# Patient Record
Sex: Female | Born: 1985 | Race: Black or African American | Hispanic: No | Marital: Single | Smoking: Never smoker
Health system: Southern US, Community
[De-identification: ages and names within clinical notes are randomized; demographics above are authoritative.]

## PROBLEM LIST (undated history)

## (undated) DIAGNOSIS — I10 Essential (primary) hypertension: Secondary | ICD-10-CM

## (undated) DIAGNOSIS — D649 Anemia, unspecified: Secondary | ICD-10-CM

## (undated) DIAGNOSIS — N926 Irregular menstruation, unspecified: Secondary | ICD-10-CM

## (undated) DIAGNOSIS — J302 Other seasonal allergic rhinitis: Secondary | ICD-10-CM

## (undated) DIAGNOSIS — E669 Obesity, unspecified: Secondary | ICD-10-CM

## (undated) DIAGNOSIS — N760 Acute vaginitis: Secondary | ICD-10-CM

## (undated) DIAGNOSIS — F32A Depression, unspecified: Secondary | ICD-10-CM

## (undated) DIAGNOSIS — F419 Anxiety disorder, unspecified: Secondary | ICD-10-CM

## (undated) DIAGNOSIS — B9689 Other specified bacterial agents as the cause of diseases classified elsewhere: Secondary | ICD-10-CM

## (undated) DIAGNOSIS — M1990 Unspecified osteoarthritis, unspecified site: Secondary | ICD-10-CM

## (undated) DIAGNOSIS — F39 Unspecified mood [affective] disorder: Secondary | ICD-10-CM

## (undated) HISTORY — PX: WISDOM TOOTH EXTRACTION: SHX21

## (undated) HISTORY — DX: Unspecified osteoarthritis, unspecified site: M19.90

## (undated) HISTORY — DX: Depression, unspecified: F32.A

## (undated) HISTORY — DX: Obesity, unspecified: E66.9

## (undated) HISTORY — DX: Essential (primary) hypertension: I10

## (undated) HISTORY — DX: Anemia, unspecified: D64.9

---
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
## 2005-04-23 ENCOUNTER — Emergency Department (HOSPITAL_COMMUNITY): Admission: EM | Admit: 2005-04-23 | Discharge: 2005-04-23 | Payer: Self-pay | Admitting: Emergency Medicine

## 2005-05-25 ENCOUNTER — Emergency Department (HOSPITAL_COMMUNITY): Admission: EM | Admit: 2005-05-25 | Discharge: 2005-05-25 | Payer: Self-pay | Admitting: Family Medicine

## 2005-12-03 ENCOUNTER — Emergency Department (HOSPITAL_COMMUNITY): Admission: EM | Admit: 2005-12-03 | Discharge: 2005-12-03 | Payer: Self-pay | Admitting: Emergency Medicine

## 2005-12-04 ENCOUNTER — Emergency Department (HOSPITAL_COMMUNITY): Admission: EM | Admit: 2005-12-04 | Discharge: 2005-12-04 | Payer: Self-pay | Admitting: Family Medicine

## 2006-12-26 ENCOUNTER — Emergency Department (HOSPITAL_COMMUNITY): Admission: EM | Admit: 2006-12-26 | Discharge: 2006-12-26 | Payer: Self-pay | Admitting: Emergency Medicine

## 2007-01-28 ENCOUNTER — Emergency Department (HOSPITAL_COMMUNITY): Admission: EM | Admit: 2007-01-28 | Discharge: 2007-01-28 | Payer: Self-pay | Admitting: Emergency Medicine

## 2007-03-21 ENCOUNTER — Emergency Department (HOSPITAL_COMMUNITY): Admission: EM | Admit: 2007-03-21 | Discharge: 2007-03-21 | Payer: Self-pay | Admitting: Emergency Medicine

## 2007-04-17 ENCOUNTER — Emergency Department (HOSPITAL_COMMUNITY): Admission: EM | Admit: 2007-04-17 | Discharge: 2007-04-17 | Payer: Self-pay | Admitting: Emergency Medicine

## 2007-04-17 ENCOUNTER — Inpatient Hospital Stay (HOSPITAL_COMMUNITY): Admission: AD | Admit: 2007-04-17 | Discharge: 2007-04-18 | Payer: Self-pay | Admitting: Gynecology

## 2007-04-27 ENCOUNTER — Emergency Department (HOSPITAL_COMMUNITY): Admission: EM | Admit: 2007-04-27 | Discharge: 2007-04-27 | Payer: Self-pay | Admitting: Emergency Medicine

## 2007-07-05 ENCOUNTER — Inpatient Hospital Stay (HOSPITAL_COMMUNITY): Admission: AD | Admit: 2007-07-05 | Discharge: 2007-07-06 | Payer: Self-pay | Admitting: Obstetrics and Gynecology

## 2007-10-17 ENCOUNTER — Inpatient Hospital Stay (HOSPITAL_COMMUNITY): Admission: AD | Admit: 2007-10-17 | Discharge: 2007-10-17 | Payer: Self-pay | Admitting: Obstetrics

## 2007-12-07 ENCOUNTER — Inpatient Hospital Stay (HOSPITAL_COMMUNITY): Admission: AD | Admit: 2007-12-07 | Discharge: 2007-12-11 | Payer: Self-pay | Admitting: Obstetrics

## 2007-12-08 ENCOUNTER — Encounter (INDEPENDENT_AMBULATORY_CARE_PROVIDER_SITE_OTHER): Payer: Self-pay | Admitting: Obstetrics

## 2008-01-30 ENCOUNTER — Emergency Department (HOSPITAL_COMMUNITY): Admission: EM | Admit: 2008-01-30 | Discharge: 2008-01-30 | Payer: Self-pay | Admitting: Emergency Medicine

## 2008-02-06 ENCOUNTER — Emergency Department (HOSPITAL_COMMUNITY): Admission: EM | Admit: 2008-02-06 | Discharge: 2008-02-06 | Payer: Self-pay | Admitting: Family Medicine

## 2008-03-05 ENCOUNTER — Emergency Department (HOSPITAL_COMMUNITY): Admission: EM | Admit: 2008-03-05 | Discharge: 2008-03-05 | Payer: Self-pay | Admitting: Family Medicine

## 2008-07-07 ENCOUNTER — Emergency Department (HOSPITAL_COMMUNITY): Admission: EM | Admit: 2008-07-07 | Discharge: 2008-07-07 | Payer: Self-pay | Admitting: Family Medicine

## 2009-11-28 ENCOUNTER — Emergency Department (HOSPITAL_COMMUNITY): Admission: EM | Admit: 2009-11-28 | Discharge: 2009-11-28 | Payer: Self-pay | Admitting: Family Medicine

## 2010-10-22 LAB — POCT I-STAT, CHEM 8
BUN: 14 mg/dL (ref 6–23)
Chloride: 106 mEq/L (ref 96–112)
Creatinine, Ser: 0.7 mg/dL (ref 0.4–1.2)
Glucose, Bld: 91 mg/dL (ref 70–99)
HCT: 42 % (ref 36.0–46.0)
Potassium: 3.7 mEq/L (ref 3.5–5.1)

## 2010-10-22 LAB — POCT PREGNANCY, URINE: Preg Test, Ur: NEGATIVE

## 2010-10-22 LAB — POCT URINALYSIS DIP (DEVICE)
Nitrite: POSITIVE — AB
Protein, ur: 100 mg/dL — AB
Urobilinogen, UA: 1 mg/dL (ref 0.0–1.0)

## 2010-11-03 ENCOUNTER — Inpatient Hospital Stay (INDEPENDENT_AMBULATORY_CARE_PROVIDER_SITE_OTHER)
Admission: RE | Admit: 2010-11-03 | Discharge: 2010-11-03 | Disposition: A | Discharge status: Home / Self Care | Payer: Medicaid Other | Source: Ambulatory Visit | Attending: Family Medicine | Admitting: Family Medicine

## 2010-11-03 DIAGNOSIS — D179 Benign lipomatous neoplasm, unspecified: Secondary | ICD-10-CM

## 2010-12-17 NOTE — Op Note (Signed)
NAME:  Paula Giles, Paula Giles NO.:  [id number]   MEDICAL RECORD NO.:  [id number]           PATIENT TYPE:   LOCATION:                                 FACILITY:   PHYSICIAN:  Kathreen Cosier, M.D.DATE OF BIRTH:  May 15, 1986   DATE OF PROCEDURE:  12/08/2007  DATE OF DISCHARGE:                               OPERATIVE REPORT   PREOP DIAGNOSIS:  Failure to progress in labor.   POSTOP DIAGNOSIS:  Failure to progress in labor.   SURGEON:  Kathreen Cosier, MD   ANESTHESIA:  Epidural.   PROCEDURE:  The patient was placed on the operating table in supine  position.  Abdomen was prepped and draped.  Bladder emptied with Foley  catheter.  Transverse suprapubic incision was made, carried down to the  rectus fascia.  Fascia was cleaned and incised at the length of  incision.  Recti muscles were retracted laterally.  Peritoneum was  incised longitudinally.  Transverse incision was made in the visceral  peritoneum above the bladder.  Bladder was mobilized inferiorly.  Transverse lower uterine incision was made.  The patient delivered from  the OP position of a female, Apgar 9 and 9, weighing 8 pounds from the OP  position.  Team was in attendance.  The fluid was clear.  Cord was  prolapsed at the side of the head.  The uterine cavity was cleaned with  dry laps.  The placenta was fundal, posterior, and removed manually and  sent to pathology.  Uterine cavity cleaned with dry laps.  Uterine  incision closed in one layer with continuous suture of #1 chromic.  Hemostasis satisfactory.  Bladder flap reattached with 2-0 chromic.  Uterus were contracted.  Tubes and ovaries were normal.  Abdomen closed  in layers.  Peritoneum continuous suture of 0 chromic.  Fascia  continuous suture of 0 Dexon.  Skin closed with staples.  Blood loss 700  mL.           ______________________________  Kathreen Cosier, M.D.     BAM/MEDQ  D:  12/08/2007  T:  12/09/2007  Job:  [id number]

## 2010-12-17 NOTE — H&P (Signed)
Paula Giles, OCCHIPINTI NO.:  [id number]   MEDICAL RECORD NO.:  [id number]          PATIENT TYPE:  INP   LOCATION:  9125                          FACILITY:  [facility]   PHYSICIAN:  Kathreen Cosier, M.D.DATE OF BIRTH:  05-04-1986   DATE OF ADMISSION:  12/07/2007  DATE OF DISCHARGE:                              HISTORY & PHYSICAL   HISTORY:  The patient is a 25 year old gravida 1, EDC Dec 10, 2007.  Her  membranes ruptured spontaneously at 7:30 a.m. on Dec 07, 2007.  She has a  positive fern, and her cervix was long, closed, vertex, -3.  She was  admitted and Cytotec 25 mcg inserted in the cervix every 6 hours x3, and  after the third one, she was contracting.  By 8:00 a.m. on Dec 08, 2007,  she was 4 cm, 100%, vertex, -1 station, contracting every 2-4 minutes.  She was fully dilated at 12:30 p.m., pushed for 40 minutes, and then the  patient refused to push and demanded a C-section.   PHYSICAL EXAMINATION:  GENERAL:  Reveal an obese female in labor.  HEENT:  Negative.  LUNGS:  Clear.  HEART:  Regular rhythm.  No murmurs.  No gallops.  BREASTS:  No masses.  ABDOMEN:  Term size estimated to be between 7-8 pounds.  EXTREMITIES:  Negative.           ______________________________  Kathreen Cosier, M.D.     BAM/MEDQ  D:  12/08/2007  T:  12/09/2007  Job:  [id number]

## 2010-12-20 NOTE — Discharge Summary (Signed)
Paula Giles, BUZZELL NO.:  [id number]   MEDICAL RECORD NO.:  [id number]          PATIENT TYPE:  INP   LOCATION:  9125                          FACILITY:  [facility]   PHYSICIAN:  Kathreen Cosier, M.D.DATE OF BIRTH:  August 02, 1986   DATE OF ADMISSION:  12/07/2007  DATE OF DISCHARGE:  12/11/2007                               DISCHARGE SUMMARY   The patient is a 25 year old gravida 1, EDC Dec 10, 2007.  Membranes  ruptured at 7:30 a.m. on the day of admission, cervix long, closed  vertex, minus 3.  Cytotec was placed for 6 hours once it started  contracting on her own.  The patient became fully dilated at 12:30 p.m.  and pushed for 40 minutes and then said she was not going to push for  anymore, so she demanded a C-section.  She had a female with Apgar 9 and  9, weighing 8 pounds from the OP position.  Postoperatively, she did  well.  She was discharged home on the third postoperative day.  Ambulatory on a regular diet to see me in 6 weeks.   DISCHARGE DIAGNOSIS:  Status post primary low-transverse cesarean  section at term.           ______________________________  Kathreen Cosier, M.D.     BAM/MEDQ  D:  12/29/2007  T:  12/29/2007  Job:  [id number]

## 2011-03-04 ENCOUNTER — Emergency Department (HOSPITAL_COMMUNITY)
Admission: EM | Admit: 2011-03-04 | Discharge: 2011-03-04 | Disposition: A | Discharge status: Home / Self Care | Payer: Medicaid Other | Attending: Emergency Medicine | Admitting: Emergency Medicine

## 2011-03-04 DIAGNOSIS — R0602 Shortness of breath: Secondary | ICD-10-CM | POA: Insufficient documentation

## 2011-03-04 DIAGNOSIS — F411 Generalized anxiety disorder: Secondary | ICD-10-CM | POA: Insufficient documentation

## 2011-04-28 LAB — URINALYSIS, ROUTINE W REFLEX MICROSCOPIC
Bilirubin Urine: NEGATIVE
Glucose, UA: NEGATIVE
Hgb urine dipstick: NEGATIVE
Nitrite: NEGATIVE
Specific Gravity, Urine: 1.02
pH: 6.5

## 2011-05-12 LAB — URINALYSIS, ROUTINE W REFLEX MICROSCOPIC
Hgb urine dipstick: NEGATIVE
Specific Gravity, Urine: >1.03 — ABNORMAL HIGH
Urobilinogen, UA: 0.2

## 2011-05-16 LAB — GC/CHLAMYDIA PROBE AMP, GENITAL
Chlamydia, DNA Probe: NEGATIVE
GC Probe Amp, Genital: NEGATIVE

## 2011-05-16 LAB — WET PREP, GENITAL: Clue Cells Wet Prep HPF POC: NONE SEEN

## 2011-05-16 LAB — CBC
HCT: 38.2
Hemoglobin: 13.1
MCHC: 34.2
RDW: 13.1

## 2011-12-20 ENCOUNTER — Encounter (HOSPITAL_COMMUNITY): Payer: Self-pay | Admitting: *Deleted

## 2011-12-20 ENCOUNTER — Emergency Department (HOSPITAL_COMMUNITY)
Admission: EM | Admit: 2011-12-20 | Discharge: 2011-12-20 | Disposition: A | Discharge status: Home / Self Care | Payer: Medicaid Other | Source: Home / Self Care | Attending: Emergency Medicine | Admitting: Emergency Medicine

## 2011-12-20 DIAGNOSIS — N898 Other specified noninflammatory disorders of vagina: Secondary | ICD-10-CM

## 2011-12-20 DIAGNOSIS — B9689 Other specified bacterial agents as the cause of diseases classified elsewhere: Secondary | ICD-10-CM

## 2011-12-20 DIAGNOSIS — N939 Abnormal uterine and vaginal bleeding, unspecified: Secondary | ICD-10-CM

## 2011-12-20 DIAGNOSIS — A499 Bacterial infection, unspecified: Secondary | ICD-10-CM

## 2011-12-20 DIAGNOSIS — N760 Acute vaginitis: Secondary | ICD-10-CM

## 2011-12-20 HISTORY — DX: Acute vaginitis: N76.0

## 2011-12-20 HISTORY — DX: Other specified bacterial agents as the cause of diseases classified elsewhere: B96.89

## 2011-12-20 HISTORY — DX: Other seasonal allergic rhinitis: J30.2

## 2011-12-20 HISTORY — DX: Unspecified mood (affective) disorder: F39

## 2011-12-20 HISTORY — DX: Irregular menstruation, unspecified: N92.6

## 2011-12-20 HISTORY — DX: Anxiety disorder, unspecified: F41.9

## 2011-12-20 LAB — DIFFERENTIAL
Basophils Relative: 1 % (ref 0–1)
Eosinophils Absolute: 0.2 10*3/uL (ref 0.0–0.7)
Monocytes Absolute: 0.7 10*3/uL (ref 0.1–1.0)
Monocytes Relative: 8 % (ref 3–12)

## 2011-12-20 LAB — POCT URINALYSIS DIP (DEVICE)
Protein, ur: 30 mg/dL — AB
Specific Gravity, Urine: >=1.03 (ref 1.005–1.030)
pH: 6.5 (ref 5.0–8.0)

## 2011-12-20 LAB — CBC
HCT: 39 % (ref 36.0–46.0)
Hemoglobin: 13.4 g/dL (ref 12.0–15.0)
MCH: 30.7 pg (ref 26.0–34.0)
MCHC: 34.4 g/dL (ref 30.0–36.0)

## 2011-12-20 LAB — WET PREP, GENITAL

## 2011-12-20 MED ORDER — METRONIDAZOLE 500 MG PO TABS: 500.0000 mg | ORAL_TABLET | Freq: Two times a day (BID) | ORAL | Status: AC

## 2011-12-20 NOTE — ED Provider Notes (Signed)
History     CSN: [id number]  Arrival date & time 12/20/11  1613   First MD Initiated Contact with Patient 12/20/11 1708      Chief Complaint  Patient presents with  . Vaginal Bleeding  . Abdominal Cramping    (Consider location/radiation/quality/duration/timing/severity/associated sxs/prior treatment) HPI Comments: Patient reports irregular vaginal bleeding starting about 2 weeks ago. Normally, she has menses for about 3 or 4 days and then it stops. States this time, she has had intermittent bleeding that ranges from light to heavy. It will for several days and then resolve. She stopped bleeding entirely for about 4 days but yesterday reports returned crampy lower midline abdominal pain, passed a large clot, and started bleeding again. She's gone through 3 pads today. She is reporting a vaginal odor as well. No nausea, vomiting, chest pain, shortness of breath, presyncope, syncope. Patient is patient states she is currently not sexually active, last sexual contact a month and half ago. Has a history of Chlamydia, BV, irregular vaginal bleeding. No history of STI's. states that she has had workup for uterine fibroids previously, but does not know the results.   ROS as noted in HPI. All other ROS negative.   Patient is a 27 y.o. female presenting with vaginal bleeding and cramps. The history is provided by the patient. No language interpreter was used.  Vaginal Bleeding This is a new problem. The current episode started more than 1 week ago. The problem occurs constantly. Associated symptoms include abdominal pain. Pertinent negatives include no chest pain, no headaches and no shortness of breath. The symptoms are aggravated by nothing. The symptoms are relieved by nothing. She has tried nothing for the symptoms. The treatment provided no relief.  Abdominal Cramping The primary symptoms of the illness include abdominal pain and vaginal bleeding. The primary symptoms of the illness do not  include fever, shortness of breath, nausea, vomiting, dysuria or vaginal discharge. The current episode started yesterday.    Past Medical History  Diagnosis Date  . Irregular menstrual bleeding   . Mood disorder   . Anxiety   . Seasonal allergies   . BV (bacterial vaginosis)     Past Surgical History  Procedure Date  . Cesarean section     History reviewed. No pertinent family history.  History  Substance Use Topics  . Smoking status: Never Smoker   . Smokeless tobacco: Not on file  . Alcohol Use: Yes    OB History    Grav Para Term Preterm Abortions TAB SAB Ect Mult Living                  Review of Systems  Constitutional: Negative for fever.  Respiratory: Negative for shortness of breath.   Cardiovascular: Negative for chest pain.  Gastrointestinal: Positive for abdominal pain. Negative for nausea and vomiting.  Genitourinary: Positive for vaginal bleeding. Negative for dysuria and vaginal discharge.  Neurological: Negative for headaches.    Allergies  Review of patient's allergies indicates no known allergies.  Home Medications   Current Outpatient Rx  Name Route Sig Dispense Refill  . ALPRAZOLAM 1 MG PO TABS Oral Take 1 mg by mouth at bedtime as needed.    . DESVENLAFAXINE SUCCINATE ER 50 MG PO TB24 Oral Take 50 mg by mouth daily.    Marland Kitchen MONTELUKAST SODIUM 10 MG PO TABS Oral Take 10 mg by mouth at bedtime.    Marland Kitchen METRONIDAZOLE 500 MG PO TABS Oral Take 1 tablet (500 mg total) by  mouth 2 (two) times daily. X 7 days 14 tablet 0    BP 120/75  Pulse 98  Temp(Src) 98.1 F (36.7 C) (Oral)  Resp 22  SpO2 100%  LMP 11/30/2011  Physical Exam  Nursing note and vitals reviewed. Constitutional: She is oriented to person, place, and time. She appears well-developed and well-nourished. No distress.  HENT:  Head: Normocephalic and atraumatic.  Eyes: Conjunctivae and EOM are normal.  Neck: Normal range of motion.  Cardiovascular: Normal rate, regular rhythm and  normal heart sounds.   Pulmonary/Chest: Effort normal and breath sounds normal.  Abdominal: Soft. Bowel sounds are normal. She exhibits no distension. There is no tenderness. There is no rebound, no guarding and no CVA tenderness.  Genitourinary: Uterus normal. Pelvic exam was performed with patient supine. There is no rash on the right labia. There is no rash on the left labia. Uterus is not enlarged and not tender. Cervix exhibits no motion tenderness, no discharge and no friability. Right adnexum displays no mass, no tenderness and no fullness. Left adnexum displays no mass, no tenderness and no fullness. No erythema or tenderness around the vagina. No foreign body around the vagina. No vaginal discharge found.       No palpable uterine fibroids. scant vaginal bleeding. Clear mucus from os. Chaperone present during exam  Musculoskeletal: Normal range of motion.  Neurological: She is alert and oriented to person, place, and time.  Skin: Skin is warm and dry.  Psychiatric: She has a normal mood and affect. Her behavior is normal. Judgment and thought content normal.    ED Course  Procedures (including critical care time)  Labs Reviewed  POCT URINALYSIS DIP (DEVICE) - Abnormal; Notable for the following:    Bilirubin Urine SMALL (*)    Ketones, ur TRACE (*)    Hgb urine dipstick LARGE (*)    Protein, ur 30 (*)    All other components within normal limits  WET PREP, GENITAL - Abnormal; Notable for the following:    Clue Cells Wet Prep HPF POC FEW (*)    WBC, Wet Prep HPF POC FEW (*)    All other components within normal limits  POCT PREGNANCY, URINE  CBC  DIFFERENTIAL  GC/CHLAMYDIA PROBE AMP, GENITAL   No results found.   1. Vaginal bleeding, abnormal   2. BV (bacterial vaginosis)     Results for orders placed during the hospital encounter of 12/20/11  POCT URINALYSIS DIP (DEVICE)      Component Value Range   Glucose, UA NEGATIVE  NEGATIVE (mg/dL)   Bilirubin Urine SMALL (*)  NEGATIVE    Ketones, ur TRACE (*) NEGATIVE (mg/dL)   Specific Gravity, Urine >=1.030  1.005 - 1.030    Hgb urine dipstick LARGE (*) NEGATIVE    pH 6.5  5.0 - 8.0    Protein, ur 30 (*) NEGATIVE (mg/dL)   Urobilinogen, UA 1.0  0.0 - 1.0 (mg/dL)   Nitrite NEGATIVE  NEGATIVE    Leukocytes, UA NEGATIVE  NEGATIVE   POCT PREGNANCY, URINE      Component Value Range   Preg Test, Ur NEGATIVE  NEGATIVE   CBC      Component Value Range   WBC 8.6  4.0 - 10.5 (K/uL)   RBC 4.37  3.87 - 5.11 (MIL/uL)   Hemoglobin 13.4  12.0 - 15.0 (g/dL)   HCT 95.6  21.3 - 08.6 (%)   MCV 89.2  78.0 - 100.0 (fL)   MCH 30.7  26.0 -  34.0 (pg)   MCHC 34.4  30.0 - 36.0 (g/dL)   RDW 81.1  91.4 - 78.2 (%)   Platelets 309  150 - 400 (K/uL)  DIFFERENTIAL      Component Value Range   Neutrophils Relative 51  43 - 77 (%)   Neutro Abs 4.4  1.7 - 7.7 (K/uL)   Lymphocytes Relative 38  12 - 46 (%)   Lymphs Abs 3.3  0.7 - 4.0 (K/uL)   Monocytes Relative 8  3 - 12 (%)   Monocytes Absolute 0.7  0.1 - 1.0 (K/uL)   Eosinophils Relative 2  0 - 5 (%)   Eosinophils Absolute 0.2  0.0 - 0.7 (K/uL)   Basophils Relative 1  0 - 1 (%)   Basophils Absolute 0.0  0.0 - 0.1 (K/uL)  WET PREP, GENITAL      Component Value Range   Yeast Wet Prep HPF POC NONE SEEN  NONE SEEN    Trich, Wet Prep NONE SEEN  NONE SEEN    Clue Cells Wet Prep HPF POC FEW (*) NONE SEEN    WBC, Wet Prep HPF POC FEW (*) NONE SEEN      MDM  Previous hemoglobin 14. Patient not reporting any symptoms of anemia. Scant bleeding from os. Will check H&H. H&P most consistent with irregular vaginal bleeding. Discussed with her that she could control her periods with birth control pills, or significantly decreased them with a Mirena IUD. She'll followup with her doctor about this. Patient also reporting vaginal odor, states she gets frequent BV. Will send her home with some Flagyl as well. Discuss lab results with patient Patient agrees with plan.  Luiz Blare,  MD 12/20/11 [phone]

## 2011-12-20 NOTE — ED Notes (Signed)
Pt with vaginal bleeding and abdominal cramping onset April 28th - per pt normal menstrual cycle 3-4 days - intermittent bleeding bleeding few days then stops for a day - per pt had stopped x 4 days passed large clot and bleeding started yesterday - pt has used 3 pads today -

## 2011-12-20 NOTE — Discharge Instructions (Signed)
Followup with Dr. Parke Simmers about ongoing management of your vaginal bleeding. He may want to consider birth control pills or a Mirena IUD to make your periods more regular or lighter. You may need another ultrasound of your uterus to make sure that there are no fibroids that could be causing your irregular bleeding. The hemoglobin is normal today. Return for shortness of breath, if you passout, chest pain, or any other concerns.

## 2011-12-23 LAB — GC/CHLAMYDIA PROBE AMP, GENITAL
Chlamydia, DNA Probe: NEGATIVE
GC Probe Amp, Genital: NEGATIVE

## 2013-02-15 ENCOUNTER — Encounter (HOSPITAL_COMMUNITY): Payer: Self-pay | Admitting: *Deleted

## 2013-02-15 ENCOUNTER — Other Ambulatory Visit (HOSPITAL_COMMUNITY)
Admission: RE | Admit: 2013-02-15 | Discharge: 2013-02-15 | Disposition: A | Discharge status: Home / Self Care | Payer: Medicaid Other | Source: Ambulatory Visit | Attending: Family Medicine | Admitting: Family Medicine

## 2013-02-15 ENCOUNTER — Emergency Department (INDEPENDENT_AMBULATORY_CARE_PROVIDER_SITE_OTHER)
Admission: EM | Admit: 2013-02-15 | Discharge: 2013-02-15 | Disposition: A | Discharge status: Home / Self Care | Payer: Medicaid Other | Source: Home / Self Care | Attending: Family Medicine | Admitting: Family Medicine

## 2013-02-15 DIAGNOSIS — N920 Excessive and frequent menstruation with regular cycle: Secondary | ICD-10-CM

## 2013-02-15 DIAGNOSIS — Z113 Encounter for screening for infections with a predominantly sexual mode of transmission: Secondary | ICD-10-CM | POA: Insufficient documentation

## 2013-02-15 LAB — POCT URINALYSIS DIP (DEVICE)
Ketones, ur: 15 mg/dL — AB
Protein, ur: >=300 mg/dL — AB
Specific Gravity, Urine: 1.02 (ref 1.005–1.030)
pH: 5 (ref 5.0–8.0)

## 2013-02-15 LAB — POCT I-STAT, CHEM 8
BUN: 13 mg/dL (ref 6–23)
Calcium, Ion: 1.27 mmol/L — ABNORMAL HIGH (ref 1.12–1.23)
Chloride: 105 mEq/L (ref 96–112)
HCT: 39 % (ref 36.0–46.0)
Potassium: 3.9 mEq/L (ref 3.5–5.1)

## 2013-02-15 LAB — POCT PREGNANCY, URINE: Preg Test, Ur: NEGATIVE

## 2013-02-15 MED ORDER — METRONIDAZOLE 500 MG PO TABS: 500.0000 mg | ORAL_TABLET | Freq: Two times a day (BID) | ORAL | Status: DC

## 2013-02-15 MED ORDER — NAPROXEN 500 MG PO TABS: 500.0000 mg | ORAL_TABLET | Freq: Two times a day (BID) | ORAL | Status: DC

## 2013-02-15 MED ORDER — NORGESTIMATE-ETH ESTRADIOL 0.25-35 MG-MCG PO TABS: ORAL_TABLET | ORAL | Status: DC

## 2013-02-15 MED ORDER — ONDANSETRON 4 MG PO TBDP: 4.0000 mg | ORAL_TABLET | Freq: Three times a day (TID) | ORAL | Status: DC | PRN

## 2013-02-15 NOTE — ED Provider Notes (Signed)
History    CSN: [id number] Arrival date & time 02/15/13  1626  First MD Initiated Contact with Patient 02/15/13 1757     Chief Complaint  Patient presents with  . Vaginal Bleeding   (Consider location/radiation/quality/duration/timing/severity/associated sxs/prior Treatment) HPI Comments: 27 year old G1 P1 001 female with history of irregular menstrual bleeding. Here complaining of heavy menstrual bleeding since waking up this morning. Patient states that she got her menstrual period on the day today she was expected he was light on the first 3 days but started heavier just today and he has been very heavy today. Reports clots and changing more than 10 menstrual pads today. Denies abdominal or pelvic pain. Reports minimal intermittent cramping. Patient states she had a bad or there is a vaginal discharge before her periods started. She also wants to be checked for STDs. Denies chest pain, dizziness or headache. Patient states she has had similar symptoms in the past. She's currently using condoms for contraception. Denies personal or family history of blood clot disorders. Denies history of easy bruising or other type of mucosal bleeding.  Past Medical History  Diagnosis Date  . Irregular menstrual bleeding   . Mood disorder   . Anxiety   . Seasonal allergies   . BV (bacterial vaginosis)    Past Surgical History  Procedure Laterality Date  . Cesarean section     History reviewed. No pertinent family history. History  Substance Use Topics  . Smoking status: Never Smoker   . Smokeless tobacco: Not on file  . Alcohol Use: Yes   OB History   Grav Para Term Preterm Abortions TAB SAB Ect Mult Living                 Review of Systems  Constitutional: Negative for fever, chills, diaphoresis, activity change, appetite change and fatigue.  Respiratory: Negative for shortness of breath.   Cardiovascular: Negative for chest pain and leg swelling.  Gastrointestinal: Negative for  nausea, vomiting, abdominal pain, diarrhea, constipation, blood in stool and abdominal distention.  Genitourinary: Positive for vaginal bleeding and vaginal discharge.  Skin: Negative for pallor and rash.  Neurological: Negative for dizziness and headaches.  All other systems reviewed and are negative.    Allergies  Review of patient's allergies indicates no known allergies.  Home Medications   Current Outpatient Rx  Name  Route  Sig  Dispense  Refill  . ALPRAZolam (XANAX) 1 MG tablet   Oral   Take 1 mg by mouth at bedtime as needed.         . desvenlafaxine (PRISTIQ) 50 MG 24 hr tablet   Oral   Take 50 mg by mouth daily.         . metroNIDAZOLE (FLAGYL) 500 MG tablet   Oral   Take 1 tablet (500 mg total) by mouth 2 (two) times daily.   14 tablet   0   . montelukast (SINGULAIR) 10 MG tablet   Oral   Take 10 mg by mouth at bedtime.         . naproxen (NAPROSYN) 500 MG tablet   Oral   Take 1 tablet (500 mg total) by mouth 2 (two) times daily with a meal. As needed.   30 tablet   0   . norgestimate-ethinyl estradiol (ORTHO-CYCLEN,SPRINTEC,PREVIFEM) 0.25-35 MG-MCG tablet      Take 1 tablet by mouth twice a day until bleeding stops then continue daily until you finish the packet can start new packet at the beginning  of the placebo pills if you want to keep your next period.   1 Package   2   . ondansetron (ZOFRAN-ODT) 4 MG disintegrating tablet   Oral   Take 1 tablet (4 mg total) by mouth every 8 (eight) hours as needed for nausea.   10 tablet   0    BP 115/57  Pulse 71  Temp(Src) 97.5 F (36.4 C) (Oral)  Resp 16  SpO2 99%  LMP 02/15/2013 Physical Exam  Nursing note and vitals reviewed. Constitutional: She is oriented to person, place, and time. She appears well-developed and well-nourished. No distress.  HENT:  Head: Normocephalic and atraumatic.  Eyes: No scleral icterus.  Neck: No thyromegaly present.  Cardiovascular: Normal rate, regular rhythm  and normal heart sounds.   Pulmonary/Chest: Breath sounds normal.  Abdominal: Soft. She exhibits no distension and no mass. There is no tenderness. There is no rebound and no guarding.  Genitourinary: Uterus is not tender. Cervix exhibits no friability. Right adnexum displays no mass, no tenderness and no fullness. Left adnexum displays no mass, no tenderness and no fullness. There is bleeding around the vagina.    Lymphadenopathy:    She has no cervical adenopathy.  Neurological: She is alert and oriented to person, place, and time.  Skin: No rash noted. She is not diaphoretic. No pallor.    ED Course  Procedures (including critical care time) Labs Reviewed  POCT URINALYSIS DIP (DEVICE) - Abnormal; Notable for the following:    Bilirubin Urine LARGE (*)    Ketones, ur 15 (*)    Hgb urine dipstick LARGE (*)    Protein, ur >=300 (*)    Urobilinogen, UA 4.0 (*)    Nitrite POSITIVE (*)    Leukocytes, UA LARGE (*)    All other components within normal limits  POCT PREGNANCY, URINE  CERVICOVAGINAL ANCILLARY ONLY   No results found. 1. Menorrhagia     MDM  Treated with Sprintec oral contraceptive by mouth with 35 mcg of estrogen twice a day on told bleeding stops then daily with 2 packet refill. Prescribed Flagyl, naproxen and ondansetron. Recommended followup with the GYN her primary care provider for ultrasound referral.   Sharin Grave, MD 02/16/13 [phone]

## 2013-02-15 NOTE — ED Notes (Signed)
Pt  Reports  Heavier  Than  Normal  Vaginal bleeding  For 5    Days         Ambulated  To  Room with a  Slow  Steady gait   denys  Any  Pain  She  Reports  Has  Had  Heavy  Bleeding in  Past       She  Is  Sitting  Upright on  Exam table      In     No  Acute  Distress

## 2013-05-02 ENCOUNTER — Inpatient Hospital Stay (HOSPITAL_COMMUNITY): Payer: Medicaid Other

## 2013-05-02 ENCOUNTER — Inpatient Hospital Stay (HOSPITAL_COMMUNITY)
Admission: AD | Admit: 2013-05-02 | Discharge: 2013-05-02 | Disposition: A | Discharge status: Home / Self Care | Payer: Medicaid Other | Source: Ambulatory Visit | Attending: Obstetrics & Gynecology | Admitting: Obstetrics & Gynecology

## 2013-05-02 DIAGNOSIS — E669 Obesity, unspecified: Secondary | ICD-10-CM | POA: Insufficient documentation

## 2013-05-02 DIAGNOSIS — N949 Unspecified condition associated with female genital organs and menstrual cycle: Secondary | ICD-10-CM | POA: Insufficient documentation

## 2013-05-02 DIAGNOSIS — N920 Excessive and frequent menstruation with regular cycle: Secondary | ICD-10-CM | POA: Insufficient documentation

## 2013-05-02 DIAGNOSIS — N938 Other specified abnormal uterine and vaginal bleeding: Secondary | ICD-10-CM

## 2013-05-02 LAB — URINALYSIS, ROUTINE W REFLEX MICROSCOPIC
Glucose, UA: NEGATIVE mg/dL
Ketones, ur: 15 mg/dL — AB
Nitrite: NEGATIVE
Specific Gravity, Urine: >1.03 — ABNORMAL HIGH (ref 1.005–1.030)
pH: 6 (ref 5.0–8.0)

## 2013-05-02 LAB — URINE MICROSCOPIC-ADD ON

## 2013-05-02 LAB — CBC
HCT: 36.1 % (ref 36.0–46.0)
Hemoglobin: 12.4 g/dL (ref 12.0–15.0)
MCHC: 34.3 g/dL (ref 30.0–36.0)
RBC: 4.13 MIL/uL (ref 3.87–5.11)
WBC: 7.5 10*3/uL (ref 4.0–10.5)

## 2013-05-02 LAB — POCT PREGNANCY, URINE: Preg Test, Ur: NEGATIVE

## 2013-05-02 LAB — WET PREP, GENITAL
WBC, Wet Prep HPF POC: NONE SEEN
Yeast Wet Prep HPF POC: NONE SEEN

## 2013-05-02 MED ORDER — IBUPROFEN 800 MG PO TABS: 800.0000 mg | ORAL_TABLET | Freq: Three times a day (TID) | ORAL | Status: DC

## 2013-05-02 MED ORDER — KETOROLAC TROMETHAMINE 60 MG/2ML IM SOLN
60.0000 mg | Freq: Once | INTRAMUSCULAR | Status: AC
  Administered 2013-05-02: 60 mg via INTRAMUSCULAR
  Filled 2013-05-02: qty 2

## 2013-05-02 NOTE — MAU Provider Note (Signed)
History     CSN: [id number]  Arrival date and time: 05/02/13 1340   None     Chief Complaint  Patient presents with  . Vaginal Bleeding  . Abdominal Pain  . Headache   HPI Comments: Paula Giles 27 y.o. G1P1 Presents to MAU for excessive vaginal bleeding and headache. She was seen in ER in July for same problem and given birth control pills. She did well with those until late Aug when she started to bleed. She has bled for one month. She has been told several times she may have fibroids and she wants an ultrasound to check that out.         Past Medical History  Diagnosis Date  . Irregular menstrual bleeding   . Mood disorder   . Anxiety   . Seasonal allergies   . BV (bacterial vaginosis)     Past Surgical History  Procedure Laterality Date  . Cesarean section      No family history on file.  History  Substance Use Topics  . Smoking status: Never Smoker   . Smokeless tobacco: Not on file  . Alcohol Use: Yes    Allergies: No Known Allergies  Prescriptions prior to admission  Medication Sig Dispense Refill  . ALPRAZolam (XANAX) 1 MG tablet Take 1 mg by mouth daily as needed for anxiety.       Marland Kitchen desvenlafaxine (PRISTIQ) 50 MG 24 hr tablet Take 50 mg by mouth daily.      Marland Kitchen ibuprofen (ADVIL,MOTRIN) 200 MG tablet Take 400 mg by mouth every 6 (six) hours as needed for pain.      . montelukast (SINGULAIR) 10 MG tablet Take 10 mg by mouth at bedtime.      Marland Kitchen PRESCRIPTION MEDICATION Apply 1 application topically daily. To chest.  Anti-fungal Cream        Review of Systems  Constitutional: Negative.   Eyes: Negative.   Respiratory: Negative.   Cardiovascular: Negative.   Gastrointestinal: Positive for abdominal pain.  Genitourinary: Negative.   Musculoskeletal: Negative.   Skin: Negative.   Neurological: Positive for headaches. Negative for dizziness.  Psychiatric/Behavioral: The patient is nervous/anxious.    Physical Exam   Blood pressure 137/74, pulse  83, temperature 97.8 F (36.6 C), temperature source Oral, resp. rate 18, height 5\' 4"  (1.626 m), weight 262 lb (118.842 kg), last menstrual period 04/01/2013.  Physical Exam  Constitutional: She appears well-developed and well-nourished. No distress.  HENT:  Head: Normocephalic and atraumatic.  Eyes: Pupils are equal, round, and reactive to light.  Cardiovascular: Normal rate, regular rhythm and normal heart sounds.   Respiratory: Effort normal and breath sounds normal.  GI: Soft. Bowel sounds are normal. There is tenderness.  Genitourinary:  External: Negative Vaginal: Bloody with clots Cervix Closed with no lesions   Results for orders placed during the hospital encounter of 05/02/13 (from the past 24 hour(s))  URINALYSIS, ROUTINE W REFLEX MICROSCOPIC     Status: Abnormal   Collection Time    05/02/13  1:45 PM      Result Value Range   Color, Urine RED (*) YELLOW   APPearance CLOUDY (*) CLEAR   Specific Gravity, Urine >1.030 (*) 1.005 - 1.030   pH 6.0  5.0 - 8.0   Glucose, UA NEGATIVE  NEGATIVE mg/dL   Hgb urine dipstick LARGE (*) NEGATIVE   Bilirubin Urine NEGATIVE  NEGATIVE   Ketones, ur 15 (*) NEGATIVE mg/dL   Protein, ur 409 (*) NEGATIVE mg/dL  Urobilinogen, UA 0.2  0.0 - 1.0 mg/dL   Nitrite NEGATIVE  NEGATIVE   Leukocytes, UA TRACE (*) NEGATIVE  URINE MICROSCOPIC-ADD ON     Status: Abnormal   Collection Time    05/02/13  1:45 PM      Result Value Range   Squamous Epithelial / LPF RARE  RARE   WBC, UA 0-2  <3 WBC/hpf   RBC / HPF TOO NUMEROUS TO COUNT  <3 RBC/hpf   Bacteria, UA FEW (*) RARE  POCT PREGNANCY, URINE     Status: None   Collection Time    05/02/13  2:17 PM      Result Value Range   Preg Test, Ur NEGATIVE  NEGATIVE  CBC     Status: None   Collection Time    05/02/13  2:19 PM      Result Value Range   WBC 7.5  4.0 - 10.5 K/uL   RBC 4.13  3.87 - 5.11 MIL/uL   Hemoglobin 12.4  12.0 - 15.0 g/dL   HCT 16.1  09.6 - 04.5 %   MCV 87.4  78.0 - 100.0 fL    MCH 30.0  26.0 - 34.0 pg   MCHC 34.3  30.0 - 36.0 g/dL   RDW 40.9  81.1 - 91.4 %   Platelets 290  150 - 400 K/uL  WET PREP, GENITAL     Status: None   Collection Time    05/02/13  3:55 PM      Result Value Range   Yeast Wet Prep HPF POC NONE SEEN  NONE SEEN   Trich, Wet Prep NONE SEEN  NONE SEEN   Clue Cells Wet Prep HPF POC NONE SEEN  NONE SEEN   WBC, Wet Prep HPF POC NONE SEEN  NONE SEEN   US Transvaginal Non-ob  05/02/2013   *RADIOLOGY REPORT*  Clinical Data: excessive menses, menorrhagia, pelvic pain, obesity  TRANSABDOMINAL AND TRANSVAGINAL ULTRASOUND OF PELVIS  Technique:  Both transabdominal and transvaginal ultrasound examinations of the pelvis were performed.  Transabdominal technique was performed for global imaging of the pelvis including uterus, ovaries, adnexal regions, and pelvic cul-de-sac.  It was necessary to proceed with endovaginal exam following the transabdominal exam to visualize the the uterus and endometrium.  Comparison:  None.  Findings: Uterus:  Normal in size and appearance  Endometrium: Normal, measuring 7 mm  Right ovary: Normal with no adnexal mass, measuring 35 x 21 x 14 mm  Left ovary: It normal with no adnexal mass, measuring 36 x 23 x 21 mm  Other Findings:  No free fluid  IMPRESSION: Normal study.  No evidence of pelvic mass or other significant abnormality.   Original Report Authenticated By: Esperanza Heir, M.D.   US Pelvis Complete  05/02/2013   *RADIOLOGY REPORT*  Clinical Data: excessive menses, menorrhagia, pelvic pain, obesity  TRANSABDOMINAL AND TRANSVAGINAL ULTRASOUND OF PELVIS  Technique:  Both transabdominal and transvaginal ultrasound examinations of the pelvis were performed.  Transabdominal technique was performed for global imaging of the pelvis including uterus, ovaries, adnexal regions, and pelvic cul-de-sac.  It was necessary to proceed with endovaginal exam following the transabdominal exam to visualize the the uterus and endometrium.   Comparison:  None.  Findings: Uterus:  Normal in size and appearance  Endometrium: Normal, measuring 7 mm  Right ovary: Normal with no adnexal mass, measuring 35 x 21 x 14 mm  Left ovary: It normal with no adnexal mass, measuring 36 x 23 x 21 mm  Other Findings:  No free fluid  IMPRESSION: Normal study.  No evidence of pelvic mass or other significant abnormality.   Original Report Authenticated By: Esperanza Heir, M.D.     MAU Course  Procedures  MDM  CBC, UA, U/S  Assessment and Plan   A: DUB P: Motrin 800 mg TID x 5 days then prn Start MVI with iron today Refer to GYN  Carolynn Serve 05/02/2013, 3:22 PM

## 2013-05-02 NOTE — MAU Provider Note (Signed)
Attestation of Attending Supervision of Advanced Practitioner (CNM/NP): Evaluation and management procedures were performed by the Advanced Practitioner under my supervision and collaboration.  I have reviewed the Advanced Practitioner's note and chart, and I agree with the management and plan.  HARRAWAY-SMITH, Sergio Zawislak 6:48 PM

## 2013-05-02 NOTE — MAU Note (Addendum)
Patient is in with c/o ongoing heavy vaginal bleeding for a month and new onset of lower abdominal pain. patient denies any pain right now. (patient describes the pain as a sharp intense pulling pressure in the "bottom of her stomach"). She also c/o headache. She states the she have been evaluated for the bleeding and put on birth control pills. She states that the bleeding restarted in September.

## 2013-08-20 ENCOUNTER — Emergency Department (INDEPENDENT_AMBULATORY_CARE_PROVIDER_SITE_OTHER): Payer: Medicaid Other

## 2013-08-20 ENCOUNTER — Encounter (HOSPITAL_COMMUNITY): Payer: Self-pay | Admitting: Emergency Medicine

## 2013-08-20 ENCOUNTER — Emergency Department (HOSPITAL_COMMUNITY)
Admission: EM | Admit: 2013-08-20 | Discharge: 2013-08-20 | Disposition: A | Discharge status: Home / Self Care | Payer: Medicaid Other | Source: Home / Self Care | Attending: Emergency Medicine | Admitting: Emergency Medicine

## 2013-08-20 DIAGNOSIS — R079 Chest pain, unspecified: Secondary | ICD-10-CM

## 2013-08-20 DIAGNOSIS — F411 Generalized anxiety disorder: Secondary | ICD-10-CM

## 2013-08-20 DIAGNOSIS — R0602 Shortness of breath: Secondary | ICD-10-CM

## 2013-08-20 DIAGNOSIS — H8110 Benign paroxysmal vertigo, unspecified ear: Secondary | ICD-10-CM

## 2013-08-20 DIAGNOSIS — R209 Unspecified disturbances of skin sensation: Secondary | ICD-10-CM

## 2013-08-20 DIAGNOSIS — F419 Anxiety disorder, unspecified: Secondary | ICD-10-CM

## 2013-08-20 DIAGNOSIS — R202 Paresthesia of skin: Secondary | ICD-10-CM

## 2013-08-20 LAB — POCT I-STAT, CHEM 8
BUN: 12 mg/dL (ref 6–23)
CALCIUM ION: 1.29 mmol/L — AB (ref 1.12–1.23)
Chloride: 102 mEq/L (ref 96–112)
Creatinine, Ser: 0.9 mg/dL (ref 0.50–1.10)
Glucose, Bld: 77 mg/dL (ref 70–99)
HEMATOCRIT: 44 % (ref 36.0–46.0)
Hemoglobin: 15 g/dL (ref 12.0–15.0)
Potassium: 3.8 mEq/L (ref 3.7–5.3)
Sodium: 140 mEq/L (ref 137–147)
TCO2: 25 mmol/L (ref 0–100)

## 2013-08-20 MED ORDER — MECLIZINE HCL 25 MG PO TABS: 25.0000 mg | ORAL_TABLET | Freq: Three times a day (TID) | ORAL | Status: DC | PRN

## 2013-08-20 MED ORDER — CLONAZEPAM 0.5 MG PO TABS: 0.5000 mg | ORAL_TABLET | Freq: Three times a day (TID) | ORAL | Status: DC | PRN

## 2013-08-20 NOTE — ED Notes (Signed)
Pt evaluate at request of registration personnel for c/o SOB and dizziness.  Pt's VS obtained, and are WNL. Pt reports feeling dizzy when standing, and SOB with exercise. Pt states that lt chest pn accompanied exercise recently, but subsided with rest. Pt was asked to return to waiting area until called for triage by RN, but to notify registration personnel of decline in condition.

## 2013-08-20 NOTE — ED Provider Notes (Signed)
Chief Complaint:   Chief Complaint  Patient presents with  . Shortness of Breath    History of Present Illness:    Paula Giles is a 28 year old female who presents with a two-day history of dizziness. She denies any whirling vertigo or presyncope. She describes a lightheaded feeling that comes and goes. It's worse if she moves or turns her head but sometimes is present even with sitting quietly. She does not have the dizziness he she's lying flat on her back and not moving. She notes numbness of her tongue and her entire body with tingling but no muscle weakness. She notes sharp, left pectoral chest pain without radiation; this occurred twice in the last 2 days lasting 10 minutes each time. This is been unrelated to position, exertion, activity, or meals. She denies any associated symptoms of nausea or diaphoresis. She has felt generally a little bit more short of breath than usual. She denies any coughing or wheezing she has had some loose stools but is doing a detox right now. She is drinking a special tea for this. Her last menstrual period was December 21. The patient states she has not been sexually active recently. She feels weak all over, tired, run down, and does not have any energy. She denies any headache, diplopia, blurry vision, URI symptoms, stiff neck, palpitations, syncope, abdominal pain, nausea, vomiting, localized muscle weakness, or difficulty with speech or ambulation.  Review of Systems:  Other than noted above, the patient denies any of the following symptoms: Systemic:  No fever, chills, fatigue, or weight loss. Eye:  No blurred vision, visual change or diplopia. ENT:  No ear pain, tinnitus, hearing loss, nasal congestion, or rhinorrhea. Cardiac:  No chest pain, dyspnea, palpitations or syncope. Neuro:  No headache, paresthesias, weakness, trouble with speech, coordination or ambulation.  [place]:  Past medical history, family history, social history, meds, and allergies were  reviewed.   Physical Exam:   Vital signs:  BP 111/65  Pulse 82  Temp(Src) 98.3 F (36.8 C) (Oral)  Resp 17  SpO2 98%  LMP 07/24/2013 Filed Vitals:   08/20/13 1831 08/20/13 1902 Supine  08/20/13 1902 Sitting  08/20/13 1904 Standing   BP: 121/80 121/73 105/65 111/65  Pulse: 74 76 80 82  Temp: 98.3 F (36.8 C)     TempSrc: Oral     Resp: 17     SpO2: 98%      General:  Alert, oriented times 3, in no distress. Eye:  PERRL, full EOM, no nystagmus. ENT:  TMs and canals normal.  Nasal mucosa normal.  Pharynx clear. Neck:  No adenopathy, tenderness, or mass.  Thyroid normal.  No carotid bruit. Lungs:  Breath sounds clear and equal bilaterally.  No wheezes, rales or rhonchi. Heart:  Regular rhythm.  No gallops, murmers, or rubs. Neuro:  Alert and oriented times 3.  Cranial nerves intact.  No pronator drift.  Finger to nose normal.  No focal weakness.  Sensation intact to light touch.  Romberg's sign negative, gait normal.  Able to do tandem gait well.  The Dix-Hallpike maneuver was positive with the left ear down.  Labs:   Results for orders placed during the hospital encounter of 08/20/13  POCT I-STAT, CHEM 8      Result Value Range   Sodium 140  137 - 147 mEq/L   Potassium 3.8  3.7 - 5.3 mEq/L   Chloride 102  96 - 112 mEq/L   BUN 12  6 - 23 mg/dL  Creatinine, Ser 0.90  0.50 - 1.10 mg/dL   Glucose, Bld 77  70 - 99 mg/dL   Calcium, Ion 1.29 (*) 1.12 - 1.23 mmol/L   TCO2 25  0 - 100 mmol/L   Hemoglobin 15.0  12.0 - 15.0 g/dL   HCT 44.0  36.0 - 46.0 %     EKG Results    Date: 08/20/2013  Rate: 77  Rhythm: normal sinus rhythm  QRS Axis: normal  Intervals: normal  ST/T Wave abnormalities: normal  Conduction Disutrbances:none  Narrative Interpretation: Normal sinus rhythm, normal EKG.  Old EKG Reviewed: none available  Dg Chest 2 View  08/20/2013   CLINICAL DATA:  Chest pain.  Shortness of breath.  EXAM: CHEST  2 VIEW  COMPARISON:  None.  FINDINGS: Heart size is  exaggerated by low lung volumes. The lungs are clear. The visualized soft tissues and bony thorax are unremarkable.  IMPRESSION: No acute cardiopulmonary disease.   Electronically Signed   By: Lawrence Santiago M.D.   On: 08/20/2013 19:36   Assessment:  The primary encounter diagnosis was Benign positional vertigo. Diagnoses of Anxiety, Shortness of breath, Chest pain, and Paresthesia were also pertinent to this visit.  Plan:   1.  Meds:  The following meds were prescribed:   Discharge Medication List as of 08/20/2013  7:55 PM    START taking these medications   Details  clonazePAM (KLONOPIN) 0.5 MG tablet Take 1 tablet (0.5 mg total) by mouth 3 (three) times daily as needed for anxiety., Starting 08/20/2013, Until Discontinued, Print    meclizine (ANTIVERT) 25 MG tablet Take 1 tablet (25 mg total) by mouth 3 (three) times daily as needed for dizziness., Starting 08/20/2013, Until Discontinued, Normal        2.  Patient Education/Counseling:  The patient was given appropriate handouts, self care instructions, and instructed in symptomatic relief.  Instructed to do Epley maneuver exercises 3 times daily until the dizziness has gone away completely.  3.  Follow up:  The patient was told to follow up if no better in 3 to 4 days, if becoming worse in any way, and given some red flag symptoms such as new neurological symptoms, severe chest pain, or worsening difficulty breathing which would prompt immediate return.  Follow up with Dr. Beatrix Shipper next week.     Harden Mo, MD 08/20/13 [phone]

## 2013-08-20 NOTE — Discharge Instructions (Signed)
You have been diagnosed with benign positional vertigo.  The cause of this is a displaced particle or crystal in the inner ear.  This can be cured by doing the exercise described below, called the Epley exercise developed by Dr. Horald Chestnut.  Do this exercise three times daily until you are able to do it for 24 hours without getting dizzy.  Sleep with your head elevated and try to avoid bending over.  If the dizziness is not better in 7 days, return here, see your primary care doctor or ENT doctor.    Benign Positional Vertigo Vertigo means you feel like you or your surroundings are moving when they are not. Benign positional vertigo is the most common form of vertigo. Benign means that the cause of your condition is not serious. Benign positional vertigo is more common in older adults. CAUSES  Benign positional vertigo is the result of an upset in the labyrinth system. This is an area in the middle ear that helps control your balance. This may be caused by a viral infection, head injury, or repetitive motion. However, often no specific cause is found. SYMPTOMS  Symptoms of benign positional vertigo occur when you move your head or eyes in different directions. Some of the symptoms may include:  Loss of balance and falls.  Vomiting.  Blurred vision.  Dizziness.  Nausea.  Involuntary eye movements (nystagmus). DIAGNOSIS  Benign positional vertigo is usually diagnosed by physical exam. If the specific cause of your benign positional vertigo is unknown, your caregiver may perform imaging tests, such as magnetic resonance imaging (MRI) or computed tomography (CT). TREATMENT  Your caregiver may recommend movements or procedures to correct the benign positional vertigo. Medicines such as meclizine, benzodiazepines, and medicines for nausea may be used to treat your symptoms. In rare cases, if your symptoms are caused by certain conditions that affect the inner ear, you may need surgery. HOME CARE  INSTRUCTIONS   Follow your caregiver's instructions.  Move slowly. Do not make sudden body or head movements.  Avoid driving.  Avoid operating heavy machinery.  Avoid performing any tasks that would be dangerous to you or others during a vertigo episode.  Drink enough fluids to keep your urine clear or pale yellow. SEEK IMMEDIATE MEDICAL CARE IF:   You develop problems with walking, weakness, numbness, or using your arms, hands, or legs.  You have difficulty speaking.  You develop severe headaches.  Your nausea or vomiting continues or gets worse.  You develop visual changes.  Your family or friends notice any behavioral changes.  Your condition gets worse.  You have a fever.  You develop a stiff neck or sensitivity to light. MAKE SURE YOU:   Understand these instructions.  Will watch your condition.  Will get help right away if you are not doing well or get worse. Document Released: 04/28/2006 Document Revised: 10/13/2011 Document Reviewed: 04/10/2011 [facility] Patient Information 2014 [place].

## 2013-08-20 NOTE — ED Notes (Signed)
See physicians note Pt c/o SOB and dizziness onset 2 days and feeling numbness when having the dizzy spells Denies: LOC, f/v/n/d, cold sxs Alert w/no signs of acute distress.

## 2013-11-24 ENCOUNTER — Ambulatory Visit: Payer: Medicaid Other | Admitting: Obstetrics & Gynecology

## 2013-11-24 ENCOUNTER — Telehealth: Payer: Self-pay | Admitting: *Deleted

## 2013-11-24 DIAGNOSIS — N920 Excessive and frequent menstruation with regular cycle: Secondary | ICD-10-CM

## 2013-11-24 NOTE — Telephone Encounter (Signed)
Ultrasound scheduled for 12/01/2013@ 1245.  Attempted to contact patient, no answer, left message with appointment date/time in radiology.  Requested patient to confirm she received the message.

## 2013-11-28 NOTE — Telephone Encounter (Signed)
Called and pt informed me that she had received the message of her [place] appt on 12/01/13 @ 1245.

## 2013-12-01 ENCOUNTER — Ambulatory Visit (HOSPITAL_COMMUNITY): Admission: RE | Admit: 2013-12-01 | Payer: Medicaid Other | Source: Ambulatory Visit

## 2013-12-06 ENCOUNTER — Encounter: Payer: Self-pay | Admitting: Obstetrics & Gynecology

## 2013-12-09 ENCOUNTER — Ambulatory Visit (HOSPITAL_COMMUNITY): Payer: Medicaid Other | Attending: Obstetrics & Gynecology

## 2014-01-13 ENCOUNTER — Ambulatory Visit: Payer: Medicaid Other | Admitting: Obstetrics & Gynecology

## 2014-08-19 ENCOUNTER — Emergency Department (HOSPITAL_COMMUNITY)
Admission: EM | Admit: 2014-08-19 | Discharge: 2014-08-19 | Disposition: A | Discharge status: Home / Self Care | Payer: Medicaid Other | Source: Home / Self Care | Attending: Emergency Medicine | Admitting: Emergency Medicine

## 2014-08-19 ENCOUNTER — Encounter (HOSPITAL_COMMUNITY): Payer: Self-pay | Admitting: *Deleted

## 2014-08-19 DIAGNOSIS — N390 Urinary tract infection, site not specified: Secondary | ICD-10-CM | POA: Diagnosis not present

## 2014-08-19 LAB — POCT PREGNANCY, URINE: Preg Test, Ur: NEGATIVE

## 2014-08-19 LAB — POCT URINALYSIS DIP (DEVICE)
Bilirubin Urine: NEGATIVE
Glucose, UA: NEGATIVE mg/dL
Ketones, ur: NEGATIVE mg/dL
Nitrite: NEGATIVE
PROTEIN: NEGATIVE mg/dL
Specific Gravity, Urine: >=1.03 (ref 1.005–1.030)
UROBILINOGEN UA: 0.2 mg/dL (ref 0.0–1.0)
pH: 5 (ref 5.0–8.0)

## 2014-08-19 MED ORDER — CEFUROXIME AXETIL 250 MG PO TABS: 250.0000 mg | ORAL_TABLET | Freq: Two times a day (BID) | ORAL | Status: DC

## 2014-08-19 MED ORDER — PHENAZOPYRIDINE HCL 200 MG PO TABS: 200.0000 mg | ORAL_TABLET | Freq: Three times a day (TID) | ORAL | Status: DC

## 2014-08-19 NOTE — ED Provider Notes (Signed)
CSN: [id number]     Arrival date & time 08/19/14  8182 History   First MD Initiated Contact with Patient 08/19/14 1004     Chief Complaint  Patient presents with  . Urinary Tract Infection   (Consider location/radiation/quality/duration/timing/severity/associated sxs/prior Treatment) HPI          29 year old female presents complaining of dysuria. Starting 2 weeks ago she has lower abdominal discomfort, burning with urination, urinary frequency, urinary urgency. She also has lower back pain. Symptoms began gradually worsening. No fever, chills, NVD, flank pain. No recent travel or sick contacts. No recent hospitalizations.  Past Medical History  Diagnosis Date  . Irregular menstrual bleeding   . Mood disorder   . Anxiety   . Seasonal allergies   . BV (bacterial vaginosis)    Past Surgical History  Procedure Laterality Date  . Cesarean section  2009   History reviewed. No pertinent family history. History  Substance Use Topics  . Smoking status: Never Smoker   . Smokeless tobacco: Not on file  . Alcohol Use: Yes     Comment: socially   OB History    No data available     Review of Systems  Constitutional: Negative for fever and chills.  Gastrointestinal: Positive for abdominal pain. Negative for nausea, vomiting and diarrhea.  Genitourinary: Positive for dysuria, urgency and frequency. Negative for hematuria and flank pain.  All other systems reviewed and are negative.   Allergies  Review of patient's allergies indicates no known allergies.  Home Medications   Prior to Admission medications   Medication Sig Start Date End Date Taking? Authorizing Provider  clonazePAM (KLONOPIN) 0.5 MG tablet Take 1 tablet (0.5 mg total) by mouth 3 (three) times daily as needed for anxiety. 08/20/13  Yes Harden Mo, MD  meclizine (ANTIVERT) 25 MG tablet Take 1 tablet (25 mg total) by mouth 3 (three) times daily as needed for dizziness. 08/20/13  Yes Harden Mo, MD  montelukast  (SINGULAIR) 10 MG tablet Take 10 mg by mouth at bedtime.   Yes Historical Provider, MD  ALPRAZolam Duanne Moron) 1 MG tablet Take 1 mg by mouth daily as needed for anxiety.     Historical Provider, MD  cefUROXime (CEFTIN) 250 MG tablet Take 1 tablet (250 mg total) by mouth 2 (two) times daily with a meal. 08/19/14   Liam Graham, PA-C  desvenlafaxine (PRISTIQ) 50 MG 24 hr tablet Take 50 mg by mouth daily.    Historical Provider, MD  ibuprofen (ADVIL,MOTRIN) 800 MG tablet Take 1 tablet (800 mg total) by mouth 3 (three) times daily. 05/02/13   Olegario Messier, NP  phenazopyridine (PYRIDIUM) 200 MG tablet Take 1 tablet (200 mg total) by mouth 3 (three) times daily. 08/19/14   Liam Graham, PA-C  PRESCRIPTION MEDICATION Apply 1 application topically daily. To chest.  Anti-fungal Cream    Historical Provider, MD   BP 126/81 mmHg  Pulse 88  Temp(Src) 98 F (36.7 C) (Oral)  Resp 16  SpO2 99%  LMP 07/28/2014 Physical Exam  Constitutional: She is oriented to person, place, and time. Vital signs are normal. She appears well-developed and well-nourished. No distress.  HENT:  Head: Normocephalic and atraumatic.  Pulmonary/Chest: Effort normal. No respiratory distress.  Abdominal: Soft. Bowel sounds are normal. She exhibits no distension and no mass. There is tenderness in the suprapubic area. There is no rebound, no guarding and no CVA tenderness.  Neurological: She is alert and oriented to person, place, and time.  She has normal strength. Coordination normal.  Skin: Skin is warm and dry. No rash noted. She is not diaphoretic.  Psychiatric: She has a normal mood and affect. Judgment normal.  Nursing note and vitals reviewed.   ED Course  Procedures (including critical care time) Labs Review Labs Reviewed  POCT URINALYSIS DIP (DEVICE) - Abnormal; Notable for the following:    Hgb urine dipstick TRACE (*)    Leukocytes, UA SMALL (*)    All other components within normal limits  URINE CULTURE   POCT PREGNANCY, URINE    Imaging Review No results found.   MDM   1. UTI (lower urinary tract infection)    urine culture sent. Treat with Ceftin and Pyridium. Follow-up when necessary  Meds ordered this encounter  Medications  . cefUROXime (CEFTIN) 250 MG tablet    Sig: Take 1 tablet (250 mg total) by mouth 2 (two) times daily with a meal.    Dispense:  10 tablet    Refill:  0    Order Specific Question:  Supervising Provider    Answer:  Jake Michaelis, DAVID C [id number]  . phenazopyridine (PYRIDIUM) 200 MG tablet    Sig: Take 1 tablet (200 mg total) by mouth 3 (three) times daily.    Dispense:  6 tablet    Refill:  0    Order Specific Question:  Supervising Provider    Answer:  Jake Michaelis, DAVID C [[id number]]      Liam Graham, PA-C 08/19/14 1040

## 2014-08-19 NOTE — ED Notes (Signed)
Hurts to urinate onset 2 weeks ago.  [facility] and was checked for STD's-all neg.  Voiding freq. in small amounts.  No hematuria.  Pain in lower abdomen.

## 2014-08-19 NOTE — Discharge Instructions (Signed)

## 2014-08-20 LAB — URINE CULTURE: Colony Count: >=100000

## 2015-04-27 ENCOUNTER — Encounter (HOSPITAL_COMMUNITY): Payer: Self-pay | Admitting: *Deleted

## 2015-04-27 ENCOUNTER — Emergency Department (HOSPITAL_COMMUNITY)
Admission: EM | Admit: 2015-04-27 | Discharge: 2015-04-28 | Disposition: A | Discharge status: Home / Self Care | Payer: Medicaid Other | Attending: Emergency Medicine | Admitting: Emergency Medicine

## 2015-04-27 ENCOUNTER — Emergency Department (HOSPITAL_COMMUNITY): Payer: Medicaid Other

## 2015-04-27 DIAGNOSIS — Z8742 Personal history of other diseases of the female genital tract: Secondary | ICD-10-CM | POA: Insufficient documentation

## 2015-04-27 DIAGNOSIS — F419 Anxiety disorder, unspecified: Secondary | ICD-10-CM | POA: Diagnosis not present

## 2015-04-27 DIAGNOSIS — R1013 Epigastric pain: Secondary | ICD-10-CM

## 2015-04-27 DIAGNOSIS — R0602 Shortness of breath: Secondary | ICD-10-CM

## 2015-04-27 DIAGNOSIS — Z79899 Other long term (current) drug therapy: Secondary | ICD-10-CM | POA: Diagnosis not present

## 2015-04-27 DIAGNOSIS — R0789 Other chest pain: Secondary | ICD-10-CM | POA: Diagnosis not present

## 2015-04-27 DIAGNOSIS — R002 Palpitations: Secondary | ICD-10-CM

## 2015-04-27 LAB — I-STAT TROPONIN, ED
Troponin i, poc: 0 ng/mL (ref 0.00–0.08)
Troponin i, poc: 0 ng/mL (ref 0.00–0.08)

## 2015-04-27 LAB — CBC
HCT: 33.2 % — ABNORMAL LOW (ref 36.0–46.0)
HEMOGLOBIN: 11 g/dL — AB (ref 12.0–15.0)
MCH: 28.4 pg (ref 26.0–34.0)
MCHC: 33.1 g/dL (ref 30.0–36.0)
MCV: 85.8 fL (ref 78.0–100.0)
Platelets: 359 10*3/uL (ref 150–400)
RBC: 3.87 MIL/uL (ref 3.87–5.11)
RDW: 14.6 % (ref 11.5–15.5)
WBC: 6.4 10*3/uL (ref 4.0–10.5)

## 2015-04-27 LAB — BASIC METABOLIC PANEL
ANION GAP: 7 (ref 5–15)
BUN: 11 mg/dL (ref 6–20)
CO2: 26 mmol/L (ref 22–32)
Calcium: 9 mg/dL (ref 8.9–10.3)
Chloride: 105 mmol/L (ref 101–111)
Creatinine, Ser: 1.07 mg/dL — ABNORMAL HIGH (ref 0.44–1.00)
GFR calc non Af Amer: >60 mL/min (ref >60)
Glucose, Bld: 84 mg/dL (ref 65–99)
Potassium: 3.7 mmol/L (ref 3.5–5.1)
Sodium: 138 mmol/L (ref 135–145)

## 2015-04-27 MED ORDER — LORAZEPAM 2 MG/ML IJ SOLN
1.0000 mg | Freq: Once | INTRAMUSCULAR | Status: AC
  Administered 2015-04-27: 1 mg via INTRAVENOUS
  Filled 2015-04-27: qty 1

## 2015-04-27 MED ORDER — GI COCKTAIL ~~LOC~~
30.0000 mL | Freq: Once | ORAL | Status: AC
  Administered 2015-04-27: 30 mL via ORAL
  Filled 2015-04-27: qty 30

## 2015-04-27 MED ORDER — PANTOPRAZOLE SODIUM 40 MG IV SOLR
40.0000 mg | Freq: Once | INTRAVENOUS | Status: AC
  Administered 2015-04-27: 40 mg via INTRAVENOUS
  Filled 2015-04-27: qty 40

## 2015-04-27 NOTE — ED Notes (Signed)
Pt states that she was at work walking around when she began having SOB and felt her heart racing. Pt states that she has an hx of anxiety and she took her meds with no relief.

## 2015-04-27 NOTE — ED Notes (Signed)
Attempted IV stick X 2  

## 2015-04-27 NOTE — ED Provider Notes (Signed)
CSN: [id number]     Arrival date & time 04/27/15  1901 History   First MD Initiated Contact with Patient 04/27/15 2134     Chief Complaint  Patient presents with  . Shortness of Breath     (Consider location/radiation/quality/duration/timing/severity/associated sxs/prior Treatment) HPI Comments: Paula Giles is a 29 y.o. female with a PMHx of mood disorder, anxiety, and abnormal uterine bleeding, who presents to the ED with complaints of sudden onset shortness of breath, palpitations, and epigastric/right sided chest pain that began while she was walking at work around noon. She states she had just eaten a [employer] meal replacement shake when her symptoms began. She reports the pain in her epigastrium/right chest is 9/10 throbbing intermittent radiating to her back worse with walking, improved with rest, and unrelieved with Klonopin. She reports that initially she thought she is having an anxiety attack, but when her Klonopin didn't help she became concerned. She continues to report shortness of breath here and epigastric chest pain. She denies any fevers, chills, lightheadedness, diaphoresis, cough, leg swelling, recent travel/surgery/immobilization, OCP use, family or personal history of DVT/PE, claudication, orthopnea, abdominal pain, nausea, vomiting, diarrhea, constipation, dysuria, hematuria, vaginal bleeding or discharge, numbness, tingling, or weakness. +FHx of cardiac disease, unsure what exactly but states her father and uncle had "bad hearts". No family hx of MI. Nonsmoker.   Patient is a 29 y.o. female presenting with shortness of breath. The history is provided by the patient. No language interpreter was used.  Shortness of Breath Severity:  Mild Onset quality:  Sudden Duration:  9 hours Timing:  Constant Progression:  Unchanged Chronicity:  New Context: activity   Relieved by:  Rest Worsened by:  Exertion Ineffective treatments:  None tried Associated symptoms: chest pain     Associated symptoms: no abdominal pain, no cough, no diaphoresis, no fever and no vomiting   Risk factors: no hx of PE/DVT, no oral contraceptive use, no prolonged immobilization, no recent surgery and no tobacco use     Past Medical History  Diagnosis Date  . Irregular menstrual bleeding   . Mood disorder   . Anxiety   . Seasonal allergies   . BV (bacterial vaginosis)    Past Surgical History  Procedure Laterality Date  . Cesarean section  2009   No family history on file. Social History  Substance Use Topics  . Smoking status: Never Smoker   . Smokeless tobacco: None  . Alcohol Use: Yes     Comment: socially   OB History    No data available     Review of Systems  Constitutional: Negative for fever, chills and diaphoresis.  Respiratory: Positive for shortness of breath. Negative for cough.   Cardiovascular: Positive for chest pain and palpitations. Negative for leg swelling.  Gastrointestinal: Negative for nausea, vomiting, abdominal pain, diarrhea and constipation.  Genitourinary: Negative for dysuria, hematuria, vaginal bleeding and vaginal discharge.  Musculoskeletal: Negative for myalgias and arthralgias.  Skin: Negative for color change.  Allergic/Immunologic: Negative for immunocompromised state.  Neurological: Negative for weakness, light-headedness and numbness.  Psychiatric/Behavioral: Negative for confusion.   10 Systems reviewed and are negative for acute change except as noted in the HPI.    Allergies  Review of patient's allergies indicates no known allergies.  Home Medications   Prior to Admission medications   Medication Sig Start Date End Date Taking? Authorizing Fran Neiswonger  ALPRAZolam Duanne Moron) 1 MG tablet Take 1 mg by mouth daily as needed for anxiety.  Historical Corrissa Martello, MD  cefUROXime (CEFTIN) 250 MG tablet Take 1 tablet (250 mg total) by mouth 2 (two) times daily with a meal. 08/19/14   Liam Graham, PA-C  clonazePAM (KLONOPIN) 0.5 MG  tablet Take 1 tablet (0.5 mg total) by mouth 3 (three) times daily as needed for anxiety. 08/20/13   Harden Mo, MD  desvenlafaxine (PRISTIQ) 50 MG 24 hr tablet Take 50 mg by mouth daily.    Historical Aayan Haskew, MD  ibuprofen (ADVIL,MOTRIN) 800 MG tablet Take 1 tablet (800 mg total) by mouth 3 (three) times daily. 05/02/13   Olegario Messier, NP  meclizine (ANTIVERT) 25 MG tablet Take 1 tablet (25 mg total) by mouth 3 (three) times daily as needed for dizziness. 08/20/13   Harden Mo, MD  montelukast (SINGULAIR) 10 MG tablet Take 10 mg by mouth at bedtime.    Historical Timohty Renbarger, MD  phenazopyridine (PYRIDIUM) 200 MG tablet Take 1 tablet (200 mg total) by mouth 3 (three) times daily. 08/19/14   Liam Graham, PA-C  PRESCRIPTION MEDICATION Apply 1 application topically daily. To chest.  Anti-fungal Cream    Historical Olufemi Mofield, MD   BP 149/89 mmHg  Pulse 92  Temp(Src) 98.3 F (36.8 C) (Oral)  Resp 18  Ht 5\' 4"  (1.626 m)  Wt 260 lb (117.935 kg)  BMI 44.61 kg/m2  SpO2 97%  LMP 04/19/2015 Physical Exam  Constitutional: She is oriented to person, place, and time. Vital signs are normal. She appears well-developed and well-nourished.  Non-toxic appearance. No distress.  Afebrile, nontoxic, NAD  HENT:  Head: Normocephalic and atraumatic.  Mouth/Throat: Oropharynx is clear and moist and mucous membranes are normal.  Eyes: Conjunctivae and EOM are normal. Right eye exhibits no discharge. Left eye exhibits no discharge.  Neck: Normal range of motion. Neck supple.  Cardiovascular: Normal rate, regular rhythm, normal heart sounds and intact distal pulses.  Exam reveals no gallop and no friction rub.   No murmur heard. RRR, nl s1/s2, no m/r/g, distal pulses intact, no pedal edema   Pulmonary/Chest: Effort normal and breath sounds normal. No respiratory distress. She has no decreased breath sounds. She has no wheezes. She has no rhonchi. She has no rales. She exhibits tenderness. She exhibits no  crepitus, no deformity and no retraction.    CTAB in all lung fields, no w/r/r, no hypoxia or increased WOB, speaking in full sentences, SpO2 97% on RA Chest wall with mild xyphoid/sternal TTP into epigastric region, no retractions or deformities, no crepitus.   Abdominal: Soft. Normal appearance and bowel sounds are normal. She exhibits no distension. There is tenderness in the epigastric area. There is no rigidity, no rebound, no guarding, no CVA tenderness, no tenderness at McBurney's point and negative Murphy's sign.    Soft, nondistended, +BS throughout, with epigastric TTP, no r/g/r, neg murphy's, neg mcburney's, no CVA TTP   Musculoskeletal: Normal range of motion.  MAE x4 Strength and sensation grossly intact Distal pulses intact No pedal edema, neg homan's bilaterally   Neurological: She is alert and oriented to person, place, and time. She has normal strength. No sensory deficit.  Skin: Skin is warm, dry and intact. No rash noted.  Psychiatric: She has a normal mood and affect.  Nursing note and vitals reviewed.   ED Course  Procedures (including critical care time) Labs Review Labs Reviewed  BASIC METABOLIC PANEL - Abnormal; Notable for the following:    Creatinine, Ser 1.07 (*)    All other components within  normal limits  CBC - Abnormal; Notable for the following:    Hemoglobin 11.0 (*)    HCT 33.2 (*)    All other components within normal limits  HEPATIC FUNCTION PANEL - Abnormal; Notable for the following:    Bilirubin, Direct <0.1 (*)    All other components within normal limits  D-DIMER, QUANTITATIVE (NOT AT [facility])  Randolm Idol, ED  Randolm Idol, ED    Imaging Review Dg Chest 2 View  04/27/2015   CLINICAL DATA:  Chest pain and shortness of Breath  EXAM: CHEST - 2 VIEW  COMPARISON:  08/20/2013  FINDINGS: Cardiac shadow is within normal limits. The lungs are well aerated bilaterally. No acute bony abnormality is seen.  IMPRESSION: No active disease.    Electronically Signed   By: Inez Catalina M.D.   On: 04/27/2015 19:52   I have personally reviewed and evaluated these images and lab results as part of my medical decision-making.   EKG Interpretation   Date/Time:  Friday April 27 2015 19:10:42 EDT Ventricular Rate:  93 PR Interval:  160 QRS Duration: 76 QT Interval:  364 QTC Calculation: 452 R Axis:   51 Text Interpretation:  Normal sinus rhythm Normal ECG since last tracing no  significant change Confirmed by Eulis Foster  MD, Vira Agar ([id number]) on 04/27/2015  9:59:25 PM      MDM   Final diagnoses:  Palpitations  Shortness of breath  Atypical chest pain  Epigastric pain  Anxiety    29 y.o. female here with sudden onset CP/SOB while walking at work today, just after eating a meal replacement shake. Unrelieved with her klonapin. Worsened with exertion, improved with rest, pain radiating to back. Labs reveal trop WNL, EKG unremarkable, CBC unremarkable aside from mild baseline anemia, and BMP WNL. CXR clear. On exam, epigastric and mild sternal TTP, reproducible pain noted. Not hypoxic or tachycardic, but given nature of symptoms and radiation to back, will obtain dimer to r/o PE. Will obtain LFTs, neg murphy's on exam but cholecystitis could be possible etiology, if LFTs abnormal will consider U/S. Will get repeat trop at 3hr mark from first troponin, which would be nearly 12hrs since onset. Will give GI cocktail, protonix, and ativan. Will reassess shortly.   12:34 AM LFTs unremarkable. Second trop neg. Dimer neg. Doubt ACS/PE/dissection. Pt feeling much better, complete resolution of chest pain. Will discharge home with zantac, discussed diet modifications for GERD/indigestion. Will have her f/up with PCP in 1wk. Discussed possible need for holter monitoring as an outpt. I explained the diagnosis and have given explicit precautions to return to the ER including for any other new or worsening symptoms. The patient understands and accepts  the medical plan as it's been dictated and I have answered their questions. Discharge instructions concerning home care and prescriptions have been given. The patient is STABLE and is discharged to home in good condition.  BP 133/71 mmHg  Pulse 76  Temp(Src) 98.3 F (36.8 C) (Oral)  Resp 21  Ht 5\' 4"  (1.626 m)  Wt 260 lb (117.935 kg)  BMI 44.61 kg/m2  SpO2 98%  LMP 04/19/2015  Meds ordered this encounter  Medications  . gi cocktail (Maalox,Lidocaine,Donnatal)    Sig:   . LORazepam (ATIVAN) injection 1 mg    Sig:   . pantoprazole (PROTONIX) injection 40 mg    Sig:   . ranitidine (ZANTAC) 150 MG tablet    Sig: Take 1 tablet (150 mg total) by mouth 2 (two) times daily.  Dispense:  30 tablet    Refill:  0    Order Specific Question:  Supervising Yarelis Ambrosino    Answer:  Noemi Chapel [[id number]]     Mercedes Camprubi-Soms, PA-C 04/28/15 [id number]  Daleen Bo, MD 04/28/15 [phone]

## 2015-04-28 ENCOUNTER — Telehealth: Payer: Self-pay | Admitting: *Deleted

## 2015-04-28 LAB — HEPATIC FUNCTION PANEL
ALT: 20 U/L (ref 14–54)
AST: 25 U/L (ref 15–41)
Albumin: 3.8 g/dL (ref 3.5–5.0)
Alkaline Phosphatase: 64 U/L (ref 38–126)
Bilirubin, Direct: <0.1 mg/dL — ABNORMAL LOW (ref 0.1–0.5)
Total Bilirubin: 0.4 mg/dL (ref 0.3–1.2)
Total Protein: 7.8 g/dL (ref 6.5–8.1)

## 2015-04-28 LAB — D-DIMER, QUANTITATIVE (NOT AT ARMC)

## 2015-04-28 MED ORDER — RANITIDINE HCL 150 MG PO TABS: 150.0000 mg | ORAL_TABLET | Freq: Two times a day (BID) | ORAL | Status: DC

## 2015-04-28 NOTE — Discharge Instructions (Signed)
Your symptoms were likely related to some indigestion after your meal, and possibly an irregular heart beat that wasn't caught on the monitor while you were here. Occasionally you will need holter monitoring of your heart as an outpatient. Start taking zantac daily as needed for indigestion. Avoid spicy or fatty foods, avoid acidic foods. Use your home anxiety medication as directed. Follow up with your regular doctor in 1 week for recheck and to discuss possible holter monitoring. Return to the ER for changes or worsening symptoms.   Palpitations A palpitation is the feeling that your heartbeat is irregular. It may feel like your heart is fluttering or skipping a beat. It may also feel like your heart is beating faster than normal. This is usually not a serious problem. In some cases, you may need more medical tests. HOME CARE  Avoid:  Caffeine in coffee, tea, soft drinks, diet pills, and energy drinks.  Chocolate.  Alcohol.  Stop smoking if you smoke.  Reduce your stress and anxiety. Try:  A method that measures bodily functions so you can learn to control them (biofeedback).  Yoga.  Meditation.  Physical activity such as swimming, jogging, or walking.  Get plenty of rest and sleep. GET HELP IF:  Your fast or irregular heartbeat continues after 24 hours.  Your palpitations occur more often. GET HELP RIGHT AWAY IF:   You have chest pain.  You feel short of breath.  You have a very bad headache.  You feel dizzy or pass out (faint). MAKE SURE YOU:   Understand these instructions.  Will watch your condition.  Will get help right away if you are not doing well or get worse. Document Released: 04/29/2008 Document Revised: 12/05/2013 Document Reviewed: 09/19/2011 [facility] Patient Information 2015 [place], [place]. This information is not intended to replace advice given to you by your health care provider. Make sure you discuss any questions you have with your health care  provider.  Holter Monitoring A Holter monitor is a small device with electrodes (small sticky patches) that attach to your chest. It records the electrical activity of your heart and is worn continuously for 24-48 hours.  A HOLTER MONITOR IS USED TO  Detect heart problems such as:  Heart arrhythmia. Is an abnormal or irregular heartbeat. With some heart arrhythmias, you may not feel or know that you have an irregular heart rhythm.  Palpitations, such as feeling your heart racing or fluttering. It is possible to have heart palpitations and not have a heart arrhythmia.  A heart rhythm that is too slow or too fast.  If you have problems fainting, near fainting or feeling light-headed, a Holter monitor may be worn to see if your heart is the cause. HOLTER MONITOR PREPARATION   Electrodes will be attached to the skin on your chest.  If you have hair on your chest, small areas may have to be shaved. This is done to help the patches stick better and make the recording more accurate.  The electrodes are attached by wires to the Holter monitor. The Holter monitor clips to your clothing. You will wear the monitor at all times, even while exercising and sleeping. HOME CARE INSTRUCTIONS   Wear your monitor at all times.  The wires and the monitor must stay dry. Do not get the monitor wet.  Do not bathe, swim or use a hot tub with it on.  You may do a "sponge" bath while you have the monitor on.  Keep your skin clean, do  not put body lotion or moisturizer on your chest.  It's possible that your skin under the electrodes could become irritated. To keep this from happening, you may put the electrodes in slightly different places on your chest.  Your caregiver will also ask you to keep a diary of your activities, such as walking or doing chores. Be sure to note what you are doing if you experience heart symptoms such as palpitations. This will help your caregiver determine what might be  contributing to your symptoms. The information stored in your monitor will be reviewed by your caregiver alongside your diary entries.  Make sure the monitor is safely clipped to your clothing or in a location close to your body that your caregiver recommends.  The monitor and electrodes are removed when the test is over. Return the monitor as directed.  Be sure to follow up with your caregiver and discuss your Holter monitor results. SEEK IMMEDIATE MEDICAL CARE IF:  You faint or feel lightheaded.  You have trouble breathing.  You get pain in your chest, upper arm or jaw.  You feel sick to your stomach and your skin is pale, cool, or damp.  You think something is wrong with the way your heart is beating. MAKE SURE YOU:   Understand these instructions.  Will watch your condition.  Will get help right away if you are not doing well or get worse. Document Released: 04/18/2004 Document Revised: 10/13/2011 Document Reviewed: 08/31/2008 [facility] Patient Information 2015 [place], [place]. This information is not intended to replace advice given to you by your health care provider. Make sure you discuss any questions you have with your health care provider.  Panic Attacks Panic attacks are sudden, short feelings of great fear or discomfort. You may have them for no reason when you are relaxed, when you are uneasy (anxious), or when you are sleeping.  HOME CARE  Take all your medicines as told.  Check with your doctor before starting new medicines.  Keep all doctor visits. GET HELP IF:  You are not able to take your medicines as told.  Your symptoms do not get better.  Your symptoms get worse. GET HELP RIGHT AWAY IF:  Your attacks seem different than your normal attacks.  You have thoughts about hurting yourself or others.  You take panic attack medicine and you have a side effect. MAKE SURE YOU:  Understand these instructions.  Will watch your condition.  Will get help  right away if you are not doing well or get worse. Document Released: 08/23/2010 Document Revised: 05/11/2013 Document Reviewed: 03/04/2013 [facility] Patient Information 2015 [place], [place]. This information is not intended to replace advice given to you by your health care provider. Make sure you discuss any questions you have with your health care provider.

## 2015-04-28 NOTE — Telephone Encounter (Signed)
Reviewed chart which clearly instructed pt to return to ED if no improvement in Symptoms. No mention of note for work. Encouraged pt to go to Urgent Care so that she could be seen for her SOB, and abdominal cramping.Verbalized  Understanding.

## 2016-02-12 ENCOUNTER — Emergency Department (HOSPITAL_BASED_OUTPATIENT_CLINIC_OR_DEPARTMENT_OTHER): Payer: Self-pay

## 2016-02-12 ENCOUNTER — Encounter (HOSPITAL_BASED_OUTPATIENT_CLINIC_OR_DEPARTMENT_OTHER): Payer: Self-pay

## 2016-02-12 ENCOUNTER — Emergency Department (HOSPITAL_BASED_OUTPATIENT_CLINIC_OR_DEPARTMENT_OTHER)
Admission: EM | Admit: 2016-02-12 | Discharge: 2016-02-12 | Disposition: A | Discharge status: Home / Self Care | Payer: Self-pay | Attending: Emergency Medicine | Admitting: Emergency Medicine

## 2016-02-12 DIAGNOSIS — R0789 Other chest pain: Secondary | ICD-10-CM

## 2016-02-12 DIAGNOSIS — Z79899 Other long term (current) drug therapy: Secondary | ICD-10-CM | POA: Insufficient documentation

## 2016-02-12 DIAGNOSIS — R0602 Shortness of breath: Secondary | ICD-10-CM | POA: Insufficient documentation

## 2016-02-12 DIAGNOSIS — R002 Palpitations: Secondary | ICD-10-CM | POA: Insufficient documentation

## 2016-02-12 LAB — CBC
HCT: 31.3 % — ABNORMAL LOW (ref 36.0–46.0)
Hemoglobin: 10.5 g/dL — ABNORMAL LOW (ref 12.0–15.0)
MCH: 28 pg (ref 26.0–34.0)
MCHC: 33.5 g/dL (ref 30.0–36.0)
MCV: 83.5 fL (ref 78.0–100.0)
PLATELETS: 344 10*3/uL (ref 150–400)
RBC: 3.75 MIL/uL — AB (ref 3.87–5.11)
RDW: 15.5 % (ref 11.5–15.5)
WBC: 6.8 10*3/uL (ref 4.0–10.5)

## 2016-02-12 LAB — BASIC METABOLIC PANEL
Anion gap: 7 (ref 5–15)
BUN: 12 mg/dL (ref 6–20)
CALCIUM: 8.7 mg/dL — AB (ref 8.9–10.3)
CO2: 26 mmol/L (ref 22–32)
CREATININE: 0.89 mg/dL (ref 0.44–1.00)
Chloride: 103 mmol/L (ref 101–111)
Glucose, Bld: 90 mg/dL (ref 65–99)
Potassium: 3.7 mmol/L (ref 3.5–5.1)
SODIUM: 136 mmol/L (ref 135–145)

## 2016-02-12 LAB — PREGNANCY, URINE: Preg Test, Ur: NEGATIVE

## 2016-02-12 LAB — TROPONIN I

## 2016-02-12 MED ORDER — ASPIRIN 81 MG PO CHEW
324.0000 mg | CHEWABLE_TABLET | Freq: Once | ORAL | Status: AC
  Administered 2016-02-12: 324 mg via ORAL
  Filled 2016-02-12: qty 4

## 2016-02-12 NOTE — ED Provider Notes (Signed)
CSN: [id number]     Arrival date & time 02/12/16  1628 History   By signing my name below, I, Irene Pap, attest that this documentation has been prepared under the direction and in the presence of Margarita Mail, PA-C. Electronically Signed: Irene Pap, ED Scribe. 02/12/2016. 6:05 PM.   Chief Complaint  Patient presents with  . Chest Pain   The history is provided by the patient. No language interpreter was used.  HPI Comments: Paula Giles is a 30 y.o. female with a hx of anxiety who presents to the Emergency Department complaining of intermittent palpitations onset earlier today. She reports associated SOB secondary to the palpitations. She states that she has taken her anxiety medication for the symptoms to no relief. Her last dose of Klonopin was 5 hours ago. Pt states that she will have palpitations with her anxiety. She reports increased stress recently. She denies fever, chills, nausea, vomiting, diaphoresis, long travel, use of BC, hx of cancer, or hx of recent surgeries. Pt is not a smoker. She denies family hx of early heart attacks. Pt takes Cymbalta, Klonopin, and Singulair daily.   Past Medical History  Diagnosis Date  . Irregular menstrual bleeding   . Mood disorder ([place])   . Anxiety   . Seasonal allergies   . BV (bacterial vaginosis)    Past Surgical History  Procedure Laterality Date  . Cesarean section  2009   No family history on file. Social History  Substance Use Topics  . Smoking status: Never Smoker   . Smokeless tobacco: None  . Alcohol Use: Yes     Comment: socially   OB History    No data available     Review of Systems  Constitutional: Negative for fever, chills and diaphoresis.  Respiratory: Positive for shortness of breath.   Cardiovascular: Positive for palpitations.  Gastrointestinal: Negative for nausea and vomiting.  Psychiatric/Behavioral: The patient is nervous/anxious.   All other systems reviewed and are  negative.     Allergies  Review of patient's allergies indicates no known allergies.  Home Medications   Prior to Admission medications   Medication Sig Start Date End Date Taking? Authorizing Provider  DULoxetine HCl (CYMBALTA PO) Take by mouth.   Yes Historical Provider, MD  clonazePAM (KLONOPIN) 0.5 MG tablet Take 1 tablet (0.5 mg total) by mouth 3 (three) times daily as needed for anxiety. 08/20/13   Harden Mo, MD   BP 146/78 mmHg  Pulse 82  Temp(Src) 99 F (37.2 C) (Oral)  Resp 20  Ht 5\' 5"  (1.651 m)  Wt 251 lb (113.853 kg)  BMI 41.77 kg/m2  SpO2 99%  LMP 02/05/2016 Physical Exam  Constitutional: She is oriented to person, place, and time. She appears well-developed and well-nourished.  HENT:  Head: Normocephalic and atraumatic.  Eyes: EOM are normal. Pupils are equal, round, and reactive to light.  Neck: Normal range of motion. Neck supple.  Cardiovascular: Normal rate, regular rhythm and normal heart sounds.  Exam reveals no gallop and no friction rub.   No murmur heard. Pulmonary/Chest: Effort normal and breath sounds normal. She has no wheezes.  Abdominal: Soft. There is no tenderness.  Musculoskeletal: Normal range of motion.  Neurological: She is alert and oriented to person, place, and time.  Skin: Skin is warm and dry.  Psychiatric: She has a normal mood and affect. Her behavior is normal.  Nursing note and vitals reviewed.   ED Course  Procedures (including critical care time) DIAGNOSTIC STUDIES:  Oxygen Saturation is 99% on RA, normal by my interpretation.    COORDINATION OF CARE: 4:49 PM-Discussed treatment plan which includes EKG, labs, and chest x-ray with pt at bedside and pt agreed to plan.    Labs Review Labs Reviewed  BASIC METABOLIC PANEL - Abnormal; Notable for the following:    Calcium 8.7 (*)    All other components within normal limits  CBC - Abnormal; Notable for the following:    RBC 3.75 (*)    Hemoglobin 10.5 (*)    HCT 31.3  (*)    All other components within normal limits  TROPONIN I  PREGNANCY, URINE    Imaging Review Dg Chest 2 View  02/12/2016  CLINICAL DATA:  Palpitation all morning with difficulty breathing and cough. EXAM: CHEST  2 VIEW COMPARISON:  April 27, 2015 FINDINGS: The heart size and mediastinal contours are within normal limits. There is no focal infiltrate, pulmonary edema, or pleural effusion. The visualized skeletal structures are unremarkable. IMPRESSION: No active cardiopulmonary disease. Electronically Signed   By: Abelardo Diesel M.D.   On: 02/12/2016 17:19   I have personally reviewed and evaluated these images and lab results as part of my medical decision-making.   EKG Interpretation None      MDM   Final diagnoses:  Chest discomfort  Fluttering sensation of heart    Patient with HEART score of 1, perc negative. Neg trop. Normal cxr and ekg.  No cp. No rhythm abnormalities Anemia is at baseline. Patient is to be discharged with recommendation to follow up with PCP in regards to today's hospital visit. Chest discomfort is not likely of cardiac or pulmonary etiology d/t presentation VSS, no tracheal deviation, no JVD or new murmur, RRR, breath sounds equal bilaterally, EKG without acute abnormalities, negative troponin, and negative CXR. Pt has been advised start a PPI and return to the ED is CP becomes exertional, associated with diaphoresis or nausea, radiates to left jaw/arm, worsens or becomes concerning in any way. Pt appears reliable for follow up and is agreeable to discharge.   Case has been discussed with and seen by Dr. Tyrone Nine who agrees with the above plan to discharge.    I personally performed the services described in this documentation, which was scribed in my presence. The recorded information has been reviewed and is accurate.      Margarita Mail, PA-C 02/12/16 [place], DO 02/12/16 1831

## 2016-02-12 NOTE — ED Notes (Addendum)
CP x today-NAD-texting in triage-steady gait

## 2016-02-12 NOTE — Discharge Instructions (Signed)
YOur work up shows no abnormalities. Please follow up with your PCP and a cardiologist if you continue to have flutters. There does not appear to be an emergent cause of your sensations today. Marland Kitchen  SEEK IMMEDIATE MEDICAL ATTENTION IF: You have severe chest pain, especially if the pain is crushing or pressure-like and spreads to the arms, back, neck, or jaw, or if you have sweating, nausea (feeling sick to your stomach), or shortness of breath. THIS IS AN EMERGENCY. Don't wait to see if the pain will go away. Get medical help at once. Call 911 or [phone] (operator). DO NOT drive yourself to the hospital.  Your chest pain gets worse and does not go away with rest.  You have an attack of chest pain lasting longer than usual, despite rest and treatment with the medications your caregiver has prescribed.  You wake from sleep with chest pain or shortness of breath.  You feel dizzy or faint.  You have chest pain not typical of your usual pain for which you originally saw your caregiver.   Holter Monitoring A Holter monitor is a small device that is used to detect abnormal heart rhythms. It clips to your clothing and is connected by wires to flat, sticky disks (electrodes) that attach to your chest. It is worn continuously for 24-48 hours. HOME CARE INSTRUCTIONS  Wear your Holter monitor at all times, even while exercising and sleeping, for as long as directed by your health care provider.  Make sure that the Holter monitor is safely clipped to your clothing or close to your body as recommended by your health care provider.  Do not get the monitor or wires wet.  Do not put body lotion or moisturizer on your chest.  Keep your skin clean.  Keep a diary of your daily activities, such as walking and doing chores. If you feel that your heartbeat is abnormal or that your heart is fluttering or skipping a beat:  Record what you are doing when it happens.  Record what time of day the symptoms occur.  Return  your Holter monitor as directed by your health care provider.  Keep all follow-up visits as directed by your health care provider. This is important. SEEK IMMEDIATE MEDICAL CARE IF:  You feel lightheaded or you faint.  You have trouble breathing.  You feel pain in your chest, upper arm, or jaw.  You feel sick to your stomach and your skin is pale, cool, or damp.  You heartbeat feels unusual or abnormal.   This information is not intended to replace advice given to you by your health care provider. Make sure you discuss any questions you have with your health care provider.   Document Released: 04/18/2004 Document Revised: 08/11/2014 Document Reviewed: 02/27/2014 Elsevier Interactive Patient Education 2016 Else[employer]  Nonspecific Chest Pain  Chest pain can be caused by many different conditions. There is always a chance that your pain could be related to something serious, such as a heart attack or a blood clot in your lungs. Chest pain can also be caused by conditions that are not life-threatening. If you have chest pain, it is very important to follow up with your health care provider. CAUSES  Chest pain can be caused by:  Heartburn.  Pneumonia or bronchitis.  Anxiety or stress.  Inflammation around your heart (pericarditis) or lung (pleuritis or pleurisy).  A blood clot in your lung.  A collapsed lung (pneumothorax). It can develop suddenly on its own (spontaneous pneumothorax) or  from trauma to the chest.  Shingles infection (varicella-zoster virus).  Heart attack.  Damage to the bones, muscles, and cartilage that make up your chest wall. This can include:  Bruised bones due to injury.  Strained muscles or cartilage due to frequent or repeated coughing or overwork.  Fracture to one or more ribs.  Sore cartilage due to inflammation (costochondritis). RISK FACTORS  Risk factors for chest pain may include:  Activities that increase your risk for trauma or  injury to your chest.  Respiratory infections or conditions that cause frequent coughing.  Medical conditions or overeating that can cause heartburn.  Heart disease or family history of heart disease.  Conditions or health behaviors that increase your risk of developing a blood clot.  Having had chicken pox (varicella zoster). SIGNS AND SYMPTOMS Chest pain can feel like:  Burning or tingling on the surface of your chest or deep in your chest.  Crushing, pressure, aching, or squeezing pain.  Dull or sharp pain that is worse when you move, cough, or take a deep breath.  Pain that is also felt in your back, neck, shoulder, or arm, or pain that spreads to any of these areas. Your chest pain may come and go, or it may stay constant. DIAGNOSIS Lab tests or other studies may be needed to find the cause of your pain. Your health care provider may have you take a test called an ambulatory ECG (electrocardiogram). An ECG records your heartbeat patterns at the time the test is performed. You may also have other tests, such as:  Transthoracic echocardiogram (TTE). During echocardiography, sound waves are used to create a picture of all of the heart structures and to look at how blood flows through your heart.  Transesophageal echocardiogram (TEE).This is a more advanced imaging test that obtains images from inside your body. It allows your health care provider to see your heart in finer detail.  Cardiac monitoring. This allows your health care provider to monitor your heart rate and rhythm in real time.  Holter monitor. This is a portable device that records your heartbeat and can help to diagnose abnormal heartbeats. It allows your health care provider to track your heart activity for several days, if needed.  Stress tests. These can be done through exercise or by taking medicine that makes your heart beat more quickly.  Blood tests.  Imaging tests. TREATMENT  Your treatment depends on  what is causing your chest pain. Treatment may include:  Medicines. These may include:  Acid blockers for heartburn.  Anti-inflammatory medicine.  Pain medicine for inflammatory conditions.  Antibiotic medicine, if an infection is present.  Medicines to dissolve blood clots.  Medicines to treat coronary artery disease.  Supportive care for conditions that do not require medicines. This may include:  Resting.  Applying heat or cold packs to injured areas.  Limiting activities until pain decreases. HOME CARE INSTRUCTIONS  If you were prescribed an antibiotic medicine, finish it all even if you start to feel better.  Avoid any activities that bring on chest pain.  Do not use any tobacco products, including cigarettes, chewing tobacco, or electronic cigarettes. If you need help quitting, ask your health care provider.  Do not drink alcohol.  Take medicines only as directed by your health care provider.  Keep all follow-up visits as directed by your health care provider. This is important. This includes any further testing if your chest pain does not go away.  If heartburn is the cause for your  chest pain, you may be told to keep your head raised (elevated) while sleeping. This reduces the chance that acid will go from your stomach into your esophagus.  Make lifestyle changes as directed by your health care provider. These may include:  Getting regular exercise. Ask your health care provider to suggest some activities that are safe for you.  Eating a heart-healthy diet. A registered dietitian can help you to learn healthy eating options.  Maintaining a healthy weight.  Managing diabetes, if necessary.  Reducing stress. SEEK MEDICAL CARE IF:  Your chest pain does not go away after treatment.  You have a rash with blisters on your chest.  You have a fever. SEEK IMMEDIATE MEDICAL CARE IF:   Your chest pain is worse.  You have an increasing cough, or you cough up  blood.  You have severe abdominal pain.  You have severe weakness.  You faint.  You have chills.  You have sudden, unexplained chest discomfort.  You have sudden, unexplained discomfort in your arms, back, neck, or jaw.  You have shortness of breath at any time.  You suddenly start to sweat, or your skin gets clammy.  You feel nauseous or you vomit.  You suddenly feel light-headed or dizzy.  Your heart begins to beat quickly, or it feels like it is skipping beats. These symptoms may represent a serious problem that is an emergency. Do not wait to see if the symptoms will go away. Get medical help right away. Call your local emergency services (911 in the [place] Do not drive yourself to the hospital.   This information is not intended to replace advice given to you by your health care provider. Make sure you discuss any questions you have with your health care provider.   Document Released: 04/30/2005 Document Revised: 08/11/2014 Document Reviewed: 02/24/2014 Elsevier Interactive Patient Education [employer].

## 2016-02-13 ENCOUNTER — Encounter (HOSPITAL_BASED_OUTPATIENT_CLINIC_OR_DEPARTMENT_OTHER): Payer: Self-pay | Admitting: *Deleted

## 2016-02-13 ENCOUNTER — Emergency Department (HOSPITAL_BASED_OUTPATIENT_CLINIC_OR_DEPARTMENT_OTHER)
Admission: EM | Admit: 2016-02-13 | Discharge: 2016-02-13 | Disposition: A | Discharge status: Home / Self Care | Payer: Medicaid Other | Attending: Dermatology | Admitting: Dermatology

## 2016-02-13 DIAGNOSIS — Z5321 Procedure and treatment not carried out due to patient leaving prior to being seen by health care provider: Secondary | ICD-10-CM | POA: Insufficient documentation

## 2016-02-13 DIAGNOSIS — R05 Cough: Secondary | ICD-10-CM | POA: Insufficient documentation

## 2016-02-13 DIAGNOSIS — R002 Palpitations: Secondary | ICD-10-CM | POA: Insufficient documentation

## 2016-02-13 NOTE — ED Notes (Signed)
Pt c/o palpitations and pro cough x 2 days ,  Seen here yesterday for same referred to cardiology and didn't not make appointment

## 2016-03-18 ENCOUNTER — Ambulatory Visit: Payer: Medicaid Other | Admitting: Cardiovascular Disease

## 2016-07-01 ENCOUNTER — Emergency Department (HOSPITAL_BASED_OUTPATIENT_CLINIC_OR_DEPARTMENT_OTHER)
Admission: EM | Admit: 2016-07-01 | Discharge: 2016-07-01 | Disposition: A | Discharge status: Home / Self Care | Payer: Medicaid Other | Attending: Emergency Medicine | Admitting: Emergency Medicine

## 2016-07-01 ENCOUNTER — Encounter (HOSPITAL_BASED_OUTPATIENT_CLINIC_OR_DEPARTMENT_OTHER): Payer: Self-pay | Admitting: *Deleted

## 2016-07-01 DIAGNOSIS — K0889 Other specified disorders of teeth and supporting structures: Secondary | ICD-10-CM | POA: Diagnosis not present

## 2016-07-01 MED ORDER — ACETAMINOPHEN 500 MG PO TABS: 1000.0000 mg | ORAL_TABLET | Freq: Four times a day (QID) | ORAL | 0 refills | Status: DC | PRN

## 2016-07-01 MED ORDER — IBUPROFEN 800 MG PO TABS: 800.0000 mg | ORAL_TABLET | Freq: Three times a day (TID) | ORAL | 0 refills | Status: DC

## 2016-07-01 MED ORDER — BUPIVACAINE-EPINEPHRINE (PF) 0.5% -1:200000 IJ SOLN
INTRAMUSCULAR | Status: AC
  Filled 2016-07-01: qty 1.8

## 2016-07-01 MED ORDER — BUPIVACAINE HCL (PF) 0.5 % IJ SOLN
INTRAMUSCULAR | Status: AC
  Administered 2016-07-01: 12:00:00
  Filled 2016-07-01: qty 10

## 2016-07-01 MED ORDER — PENICILLIN V POTASSIUM 500 MG PO TABS: 500.0000 mg | ORAL_TABLET | Freq: Four times a day (QID) | ORAL | 0 refills | Status: AC

## 2016-07-01 MED FILL — IBUPROFEN 800 MG TABLET: 800 | 7 days supply | Qty: 21 | Fill #0

## 2016-07-01 MED FILL — MAPAP 500 MG CAPLET: 500 | 13 days supply | Qty: 100 | Fill #0

## 2016-07-01 MED FILL — PENICILLIN VK 500 MG TABLET: 500 | 7 days supply | Qty: 28 | Fill #0

## 2016-07-01 NOTE — ED Triage Notes (Signed)
Patient c/o dental pain from cracked tooth left lower jaw. She has been taking otc meds but no relief

## 2016-07-01 NOTE — ED Provider Notes (Signed)
[facility] Provider Note   CSN: [id number] Arrival date & time: 07/01/16  1045     History   Chief Complaint Chief Complaint  Patient presents with  . Dental Pain    HPI Paula Giles is a 30 y.o. female.  The history is provided by the patient.  Dental Pain   This is a new problem. The current episode started more than 2 days ago. The problem occurs constantly. The problem has not changed since onset.The pain is moderate. She has tried acetaminophen and aspirin for the symptoms. The treatment provided mild relief.   Patient presents with 4 day history of left lower molar dental pain after she cracked her tooth on a piece of ice.  She states she had a fever of 102 yesterday that has since resolved.  No Facial swelling, tongue swelling, drooling, neck pain, dysphasia, or any other symptoms. She has been taking Tylenol, ibuprofen, and Aleve with some relief of her symptoms. She does not have a Pharmacist, community.  Past Medical History:  Diagnosis Date  . Anxiety   . BV (bacterial vaginosis)   . Irregular menstrual bleeding   . Mood disorder ([place])   . Seasonal allergies     There are no active problems to display for this patient.   Past Surgical History:  Procedure Laterality Date  . CESAREAN SECTION  2009  . WISDOM TOOTH EXTRACTION      OB History    No data available       Home Medications    Prior to Admission medications   Medication Sig Start Date End Date Taking? Authorizing Provider  vortioxetine HBr (TRINTELLIX) 5 MG TABS Take by mouth.   Yes Historical Provider, MD  acetaminophen (TYLENOL) 500 MG tablet Take 2 tablets (1,000 mg total) by mouth every 6 (six) hours as needed. 07/01/16   Gloriann Loan, PA-C  clonazePAM (KLONOPIN) 0.5 MG tablet Take 1 tablet (0.5 mg total) by mouth 3 (three) times daily as needed for anxiety. 08/20/13   Harden Mo, MD  DULoxetine HCl (CYMBALTA PO) Take by mouth.    Historical Provider, MD  ibuprofen (ADVIL,MOTRIN) 800 MG  tablet Take 1 tablet (800 mg total) by mouth 3 (three) times daily. 07/01/16   Gloriann Loan, PA-C  penicillin v potassium (VEETID) 500 MG tablet Take 1 tablet (500 mg total) by mouth 4 (four) times daily. 07/01/16 07/08/16  Gloriann Loan, PA-C    Family History No family history on file.  Social History Social History  Substance Use Topics  . Smoking status: Never Smoker  . Smokeless tobacco: Not on file  . Alcohol use Yes     Comment: socially     Allergies   Patient has no known allergies.   Review of Systems Review of Systems All other systems negative unless otherwise stated in HPI   Physical Exam Updated Vital Signs BP 143/70 (BP Location: Right Arm)   Pulse 66   Temp 98 F (36.7 C) (Oral)   Resp 19   Ht 5\' 5"  (1.651 m)   Wt 113.4 kg   SpO2 100%   BMI 41.60 kg/m   Physical Exam  Constitutional: She is oriented to person, place, and time. She appears well-developed and well-nourished.  HENT:  Head: Normocephalic and atraumatic.  Mouth/Throat: Uvula is midline, oropharynx is clear and moist and mucous membranes are normal. No trismus in the jaw. Abnormal dentition. Dental caries present. No dental abscesses.  No tongue swelling or facial swelling.  Cracked posterior molar on the left with gingival swelling.  No abscess. Airway patent. Patient tolerating secretions without difficulty.   Eyes: Conjunctivae are normal.  Neck: Normal range of motion. Neck supple.  No evidence of Ludwigs angina.  Cardiovascular: Normal rate, regular rhythm and normal heart sounds.   Pulmonary/Chest: Effort normal and breath sounds normal.  Abdominal: Bowel sounds are normal. She exhibits no distension.  Musculoskeletal:  Moves all extremities spontaneously.  Lymphadenopathy:    She has no cervical adenopathy.  Neurological: She is alert and oriented to person, place, and time.  Speech clear without dysarthria.   Skin: Skin is warm and dry.     ED Treatments / Results  Labs (all  labs ordered are listed, but only abnormal results are displayed) Labs Reviewed - No data to display  EKG  EKG Interpretation None       Radiology No results found.  Procedures Dental Block Date/Time: 07/01/2016 11:47 AM Performed by: Gloriann Loan Authorized by: Gloriann Loan   Consent:    Consent obtained:  Verbal   Consent given by:  Patient   Risks discussed:  Allergic reaction, infection and pain Indications:    Indications: dental pain   Location:    Block type:  Inferior alveolar   Laterality:  Left Procedure details (see MAR for exact dosages):    Needle gauge:  27 G   Anesthetic injected:  Bupivacaine 0.5% WITH epi   Injection procedure:  Anatomic landmarks identified, introduced needle and negative aspiration for blood Post-procedure details:    Outcome:  Anesthesia achieved   Patient tolerance of procedure:  Tolerated well, no immediate complications   (including critical care time)  Medications Ordered in ED Medications  bupivacaine (MARCAINE) 0.5 % injection (not administered)  bupivacaine-epinephrine (MARCAINE W/ EPI) 0.5% -1:200000 injection (not administered)     Initial Impression / Assessment and Plan / ED Course  I have reviewed the triage vital signs and the nursing notes.  Pertinent labs & imaging results that were available during my care of the patient were reviewed by me and considered in my medical decision making (see chart for details).  Clinical Course    Patient presents with dental pain after cracking her tooth on a piece of ice. No signs of Ludwig angina on exam. She states she did have a fever yesterday of 102 that is since resolved. There is surrounding gingival irritation without obvious abscess. Pain relieved with dental block. Due to fever, patient will be sent home with penicillin and ibuprofen and Tylenol. She is given resources for emergency dentists in the area. Return precautions discussed. Stable for discharge.    Final  Clinical Impressions(s) / ED Diagnoses   Final diagnoses:  Pain, dental    New Prescriptions New Prescriptions   ACETAMINOPHEN (TYLENOL) 500 MG TABLET    Take 2 tablets (1,000 mg total) by mouth every 6 (six) hours as needed.   IBUPROFEN (ADVIL,MOTRIN) 800 MG TABLET    Take 1 tablet (800 mg total) by mouth 3 (three) times daily.   PENICILLIN V POTASSIUM (VEETID) 500 MG TABLET    Take 1 tablet (500 mg total) by mouth 4 (four) times daily.     Gloriann Loan, PA-C 07/01/16 [place], MD 07/01/16 [phone]

## 2016-07-01 NOTE — Discharge Instructions (Signed)
Please follow up with a Dentist for further evaluation and management.   °Call [phone] for Emergent Dental Care ° °Check this website for free, low-income or sliding scale dental services in [place]. www.freedental.us   °To find a dentist in the [place] or surrounding areas check this website: [email or web address] ° °

## 2016-07-13 ENCOUNTER — Encounter (HOSPITAL_BASED_OUTPATIENT_CLINIC_OR_DEPARTMENT_OTHER): Payer: Self-pay | Admitting: Emergency Medicine

## 2016-07-13 ENCOUNTER — Emergency Department (HOSPITAL_BASED_OUTPATIENT_CLINIC_OR_DEPARTMENT_OTHER)
Admission: EM | Admit: 2016-07-13 | Discharge: 2016-07-13 | Disposition: A | Discharge status: Home / Self Care | Payer: Medicaid Other | Attending: Emergency Medicine | Admitting: Emergency Medicine

## 2016-07-13 DIAGNOSIS — K0889 Other specified disorders of teeth and supporting structures: Secondary | ICD-10-CM | POA: Insufficient documentation

## 2016-07-13 DIAGNOSIS — Z79899 Other long term (current) drug therapy: Secondary | ICD-10-CM | POA: Insufficient documentation

## 2016-07-13 DIAGNOSIS — Z791 Long term (current) use of non-steroidal anti-inflammatories (NSAID): Secondary | ICD-10-CM | POA: Insufficient documentation

## 2016-07-13 MED ORDER — PENICILLIN V POTASSIUM 500 MG PO TABS: 500.0000 mg | ORAL_TABLET | Freq: Three times a day (TID) | ORAL | 0 refills | Status: DC

## 2016-07-13 MED ORDER — TRAMADOL HCL 50 MG PO TABS
50.0000 mg | ORAL_TABLET | Freq: Four times a day (QID) | ORAL | 0 refills | Status: DC | PRN
Start: 2016-07-13 — End: 2020-06-29

## 2016-07-13 NOTE — ED Triage Notes (Signed)
L lower dental pain for several weeks. Was seen here for same and given pcn and ibuprofen. Pt has taken all meds and pain persists.

## 2016-07-13 NOTE — ED Provider Notes (Signed)
[facility] Provider Note   CSN: [id number] Arrival date & time: 07/13/16  1128     History   Chief Complaint Chief Complaint  Patient presents with  . Dental Pain    HPI Paula Giles is a 30 y.o. female.  Patient is a 30 year old female who presents with complaints of toothache. She was seen here several weeks ago with similar complaints. She was given antibiotics and pain medication and discharged. She has been unable to follow-up with dentistry due to financial reasons. She returns today with ongoing tooth pain. She denies any fevers or chills or difficulty breathing or swallowing.   The history is provided by the patient.  Dental Pain   This is a new problem. Episode onset: 2 weeks ago. The problem occurs constantly. The problem has been gradually worsening. The pain is moderate. She has tried nothing for the symptoms. The treatment provided no relief.    Past Medical History:  Diagnosis Date  . Anxiety   . BV (bacterial vaginosis)   . Irregular menstrual bleeding   . Mood disorder ([place])   . Seasonal allergies     There are no active problems to display for this patient.   Past Surgical History:  Procedure Laterality Date  . CESAREAN SECTION  2009  . WISDOM TOOTH EXTRACTION      OB History    No data available       Home Medications    Prior to Admission medications   Medication Sig Start Date End Date Taking? Authorizing Provider  clonazePAM (KLONOPIN) 0.5 MG tablet Take 1 tablet (0.5 mg total) by mouth 3 (three) times daily as needed for anxiety. 08/20/13  Yes Harden Mo, MD  vortioxetine HBr (TRINTELLIX) 5 MG TABS Take by mouth.   Yes Historical Provider, MD  acetaminophen (TYLENOL) 500 MG tablet Take 2 tablets (1,000 mg total) by mouth every 6 (six) hours as needed. 07/01/16   Gloriann Loan, PA-C  DULoxetine HCl (CYMBALTA PO) Take by mouth.    Historical Provider, MD  ibuprofen (ADVIL,MOTRIN) 800 MG tablet Take 1 tablet (800 mg total)  by mouth 3 (three) times daily. 07/01/16   Gloriann Loan, PA-C  penicillin v potassium (VEETID) 500 MG tablet Take 1 tablet (500 mg total) by mouth 3 (three) times daily. 07/13/16   Veryl Speak, MD  traMADol (ULTRAM) 50 MG tablet Take 1 tablet (50 mg total) by mouth every 6 (six) hours as needed. 07/13/16   Veryl Speak, MD    Family History No family history on file.  Social History Social History  Substance Use Topics  . Smoking status: Never Smoker  . Smokeless tobacco: Never Used  . Alcohol use Yes     Comment: socially     Allergies   Patient has no known allergies.   Review of Systems Review of Systems  All other systems reviewed and are negative.    Physical Exam Updated Vital Signs BP 133/88 (BP Location: Right Arm)   Pulse 81   Temp 98 F (36.7 C) (Oral)   Resp 18   Ht 5\' 5"  (1.651 m)   Wt 257 lb (116.6 kg)   LMP 07/10/2016   SpO2 99%   BMI 42.77 kg/m   Physical Exam  Constitutional: She is oriented to person, place, and time. She appears well-developed and well-nourished. No distress.  HENT:  Head: Normocephalic and atraumatic.  The left second molar has significant decay. There is mild surrounding gingival inflammation, however no swelling  that would be consistent with an abscess.  Neck: Normal range of motion. Neck supple.  Cardiovascular: Normal rate and regular rhythm.  Exam reveals no gallop and no friction rub.   No murmur heard. Pulmonary/Chest: Effort normal and breath sounds normal. No respiratory distress. She has no wheezes.  Abdominal: Soft. Bowel sounds are normal. She exhibits no distension. There is no tenderness.  Musculoskeletal: Normal range of motion.  Neurological: She is alert and oriented to person, place, and time.  Skin: Skin is warm and dry. She is not diaphoretic.  Nursing note and vitals reviewed.    ED Treatments / Results  Labs (all labs ordered are listed, but only abnormal results are displayed) Labs Reviewed - No  data to display  EKG  EKG Interpretation None       Radiology No results found.  Procedures Procedures (including critical care time)  Medications Ordered in ED Medications - No data to display   Initial Impression / Assessment and Plan / ED Course  I have reviewed the triage vital signs and the nursing notes.  Pertinent labs & imaging results that were available during my care of the patient were reviewed by me and considered in my medical decision making (see chart for details).  Clinical Course     Will treat with penicillin and tramadol. She was again advised to follow-up with a dentist.  Final Clinical Impressions(s) / ED Diagnoses   Final diagnoses:  Pain, dental    New Prescriptions Discharge Medication List as of 07/13/2016 11:56 AM    START taking these medications   Details  penicillin v potassium (VEETID) 500 MG tablet Take 1 tablet (500 mg total) by mouth 3 (three) times daily., Starting Sun 07/13/2016, Print    traMADol (ULTRAM) 50 MG tablet Take 1 tablet (50 mg total) by mouth every 6 (six) hours as needed., Starting Sun 07/13/2016, Print         Veryl Speak, MD 07/13/16 [phone]

## 2016-07-13 NOTE — Discharge Instructions (Signed)
Penicillin as prescribed.  Tramadol as prescribed as needed for pain.  Follow-up with dentistry. Contact information for Dr. Mariel Sleet has been provided in this discharge summary for you to call and make these arrangements.

## 2016-09-11 ENCOUNTER — Encounter (HOSPITAL_COMMUNITY): Payer: Self-pay

## 2016-09-11 ENCOUNTER — Inpatient Hospital Stay (HOSPITAL_COMMUNITY)
Admission: AD | Admit: 2016-09-11 | Discharge: 2016-09-11 | Disposition: A | Discharge status: Home / Self Care | Payer: Commercial Managed Care - PPO | Source: Ambulatory Visit | Attending: Obstetrics and Gynecology | Admitting: Obstetrics and Gynecology

## 2016-09-11 DIAGNOSIS — B9689 Other specified bacterial agents as the cause of diseases classified elsewhere: Secondary | ICD-10-CM | POA: Insufficient documentation

## 2016-09-11 DIAGNOSIS — N946 Dysmenorrhea, unspecified: Secondary | ICD-10-CM | POA: Diagnosis present

## 2016-09-11 DIAGNOSIS — F329 Major depressive disorder, single episode, unspecified: Secondary | ICD-10-CM | POA: Insufficient documentation

## 2016-09-11 DIAGNOSIS — N760 Acute vaginitis: Secondary | ICD-10-CM | POA: Diagnosis not present

## 2016-09-11 DIAGNOSIS — N938 Other specified abnormal uterine and vaginal bleeding: Secondary | ICD-10-CM | POA: Diagnosis not present

## 2016-09-11 DIAGNOSIS — Z79899 Other long term (current) drug therapy: Secondary | ICD-10-CM | POA: Insufficient documentation

## 2016-09-11 DIAGNOSIS — F411 Generalized anxiety disorder: Secondary | ICD-10-CM | POA: Diagnosis not present

## 2016-09-11 LAB — CBC
HEMATOCRIT: 33.5 % — AB (ref 36.0–46.0)
HEMOGLOBIN: 11.2 g/dL — AB (ref 12.0–15.0)
MCH: 26.8 pg (ref 26.0–34.0)
MCHC: 33.4 g/dL (ref 30.0–36.0)
MCV: 80.1 fL (ref 78.0–100.0)
Platelets: 346 10*3/uL (ref 150–400)
RBC: 4.18 MIL/uL (ref 3.87–5.11)
RDW: 16.2 % — ABNORMAL HIGH (ref 11.5–15.5)
WBC: 7.9 10*3/uL (ref 4.0–10.5)

## 2016-09-11 LAB — COMPREHENSIVE METABOLIC PANEL
ALK PHOS: 64 U/L (ref 38–126)
ALT: 20 U/L (ref 14–54)
AST: 27 U/L (ref 15–41)
Albumin: 4.2 g/dL (ref 3.5–5.0)
Anion gap: 8 (ref 5–15)
BILIRUBIN TOTAL: 0.7 mg/dL (ref 0.3–1.2)
BUN: 13 mg/dL (ref 6–20)
CALCIUM: 8.8 mg/dL — AB (ref 8.9–10.3)
CO2: 25 mmol/L (ref 22–32)
CREATININE: 0.91 mg/dL (ref 0.44–1.00)
Chloride: 102 mmol/L (ref 101–111)
Glucose, Bld: 82 mg/dL (ref 65–99)
Potassium: 4.2 mmol/L (ref 3.5–5.1)
Sodium: 135 mmol/L (ref 135–145)
Total Protein: 7.8 g/dL (ref 6.5–8.1)

## 2016-09-11 LAB — URINALYSIS, ROUTINE W REFLEX MICROSCOPIC
BILIRUBIN URINE: NEGATIVE
Bacteria, UA: NONE SEEN
Glucose, UA: NEGATIVE mg/dL
Ketones, ur: NEGATIVE mg/dL
LEUKOCYTES UA: NEGATIVE
Nitrite: NEGATIVE
PH: 6 (ref 5.0–8.0)
Protein, ur: 30 mg/dL — AB
SPECIFIC GRAVITY, URINE: 1.023 (ref 1.005–1.030)

## 2016-09-11 LAB — WET PREP, GENITAL
Sperm: NONE SEEN
TRICH WET PREP: NONE SEEN
YEAST WET PREP: NONE SEEN

## 2016-09-11 LAB — POCT PREGNANCY, URINE: Preg Test, Ur: NEGATIVE

## 2016-09-11 MED ORDER — METRONIDAZOLE 500 MG PO TABS: 500.0000 mg | ORAL_TABLET | Freq: Two times a day (BID) | ORAL | 0 refills | Status: DC

## 2016-09-11 MED ORDER — IBUPROFEN 800 MG PO TABS: 800.0000 mg | ORAL_TABLET | Freq: Three times a day (TID) | ORAL | 1 refills | Status: DC | PRN

## 2016-09-11 NOTE — Discharge Instructions (Signed)
Dysfunctional Uterine Bleeding Introduction Dysfunctional uterine bleeding is abnormal bleeding from the uterus. Dysfunctional uterine bleeding includes:  A period that comes earlier or later than usual.  A period that is lighter, heavier, or has blood clots.  Bleeding between periods.  Skipping one or more periods.  Bleeding after sexual intercourse.  Bleeding after menopause. Follow these instructions at home: Pay attention to any changes in your symptoms. Follow these instructions to help with your condition: Eating and drinking  Eat well-balanced meals. Include foods that are high in iron, such as liver, meat, shellfish, green leafy vegetables, and eggs.  If you become constipated:  Drink plenty of water.  Eat fruits and vegetables that are high in water and fiber, such as spinach, carrots, raspberries, apples, and mango. Medicines  Take over-the-counter and prescription medicines only as told by your health care provider.  Do not change medicines without talking with your health care provider.  Aspirin or medicines that contain aspirin may make the bleeding worse. Do not take those medicines:  During the week before your period.  During your period.  If you were prescribed iron pills, take them as told by your health care provider. Iron pills help to replace iron that your body loses because of this condition. Activity  If you need to change your sanitary pad or tampon more than one time every 2 hours:  Lie in bed with your feet raised (elevated).  Place a cold pack on your lower abdomen.  Rest as much as possible until the bleeding stops or slows down.  Do not try to lose weight until the bleeding has stopped and your blood iron level is back to normal. Other Instructions  For two months, write down:  When your period starts.  When your period ends.  When any abnormal bleeding occurs.  What problems you notice.  Keep all follow up visits as told by  your health care provider. This is important. Contact a health care provider if:  You get light-headed or weak.  You have nausea and vomiting.  You cannot eat or drink without vomiting.  You feel dizzy or have diarrhea while you are taking medicines.  You are taking birth control pills or hormones, and you want to change them or stop taking them. Get help right away if:  You develop a fever or chills.  You need to change your sanitary pad or tampon more than one time per hour.  Your bleeding becomes heavier, or your flow contains clots more often.  You develop pain in your abdomen.  You lose consciousness.  You develop a rash. This information is not intended to replace advice given to you by your health care provider. Make sure you discuss any questions you have with your health care provider. Document Released: 07/18/2000 Document Revised: 12/27/2015 Document Reviewed: 10/16/2014  2017 Elsevier

## 2016-09-11 NOTE — MAU Provider Note (Signed)
History     CSN: [id number]  Arrival date and time: 09/11/16 1631   First Provider Initiated Contact with Patient 09/11/16 1720      Chief Complaint  Patient presents with  . Dysmenorrhea   HPI: Paula Giles is 31 yo G1P1 who presents to the MAU with c/o vaginal bleeding for the last 8 months. She also felt tried and weak today. She reports some degree of bleeding every day for the last 8 months. Thanks her last normal cycle was in May. Cycles are usually monthly and last @ 7 days.  She saw her PCP a few weeks ago and reports her labs were normal. Did not mention the bleeding because it was not bad that day.  Not sexual active at present. Last intercourse over 8 months ago. No contraception  Last GYN exam with pap smear and STD 3-4 months ago at the [facility]. All negative except for some BV which was treated. She reports using OCP's in the past for her bleeding but was unable to tolerate d/t increase blood pressure.  She has a h/o GAD and depression  POB C section 2009    Past Medical History:  Diagnosis Date  . Anxiety   . BV (bacterial vaginosis)   . Irregular menstrual bleeding   . Mood disorder ([place])   . Seasonal allergies     Past Surgical History:  Procedure Laterality Date  . CESAREAN SECTION  2009  . WISDOM TOOTH EXTRACTION      History reviewed. No pertinent family history.  Social History  Substance Use Topics  . Smoking status: Never Smoker  . Smokeless tobacco: Never Used  . Alcohol use Yes     Comment: socially    Allergies: No Known Allergies  Prescriptions Prior to Admission  Medication Sig Dispense Refill Last Dose  . acetaminophen (TYLENOL) 500 MG tablet Take 2 tablets (1,000 mg total) by mouth every 6 (six) hours as needed. 30 tablet 0   . clonazePAM (KLONOPIN) 0.5 MG tablet Take 1 tablet (0.5 mg total) by mouth 3 (three) times daily as needed for anxiety. 15 tablet 0 04/27/2015 at Unknown time  . DULoxetine HCl (CYMBALTA PO) Take by mouth.     Marland Kitchen  ibuprofen (ADVIL,MOTRIN) 800 MG tablet Take 1 tablet (800 mg total) by mouth 3 (three) times daily. 21 tablet 0   . penicillin v potassium (VEETID) 500 MG tablet Take 1 tablet (500 mg total) by mouth 3 (three) times daily. 30 tablet 0   . traMADol (ULTRAM) 50 MG tablet Take 1 tablet (50 mg total) by mouth every 6 (six) hours as needed. 15 tablet 0   . vortioxetine HBr (TRINTELLIX) 5 MG TABS Take by mouth.       Review of Systems Physical Exam   Blood pressure 116/76, pulse 76, temperature 99.1 F (37.3 C), resp. rate 18, height 5\' 5"  (1.651 m), weight 256 lb 1.9 oz (116.2 kg), last menstrual period 09/08/2016.  Physical Exam  Constitutional: She appears well-developed and well-nourished.  Cardiovascular: Normal rate and regular rhythm.   Respiratory: Effort normal and breath sounds normal.  Genitourinary:  Genitourinary Comments: Nl EGBUS, cervix without visible lesion, small amount of bleeding noted, cultures obtained, uterus 10 wk size non tender, no adnexal masses or tenderness    MAU Course  Procedures Hgb 11.2 Wet prep BV    Assessment and Plan  DUB BV  Will schedule outpt GYN U/S and f/u with GYN clinic Flagyl for BV  Chancy Milroy  09/11/2016, 5:21 PM

## 2016-09-11 NOTE — MAU Note (Signed)
Pt reports she has been having irregular bleeding for the past 8 month or so. Told she had fibroids. C/o hot and cold flashes, Difficulty sleeping and abdomila pain. Pt reports vaginal bleeding since Monday.

## 2016-09-11 NOTE — MAU Note (Signed)
Pt had additional questions and I asked Dr Rip Harbour to go back into room to answer questions. He had previously been in room to explain discharge instructions.

## 2016-09-12 LAB — GC/CHLAMYDIA PROBE AMP (~~LOC~~) NOT AT ARMC
Chlamydia: NEGATIVE
Neisseria Gonorrhea: NEGATIVE

## 2016-09-18 ENCOUNTER — Ambulatory Visit (HOSPITAL_COMMUNITY)
Admission: RE | Admit: 2016-09-18 | Discharge: 2016-09-18 | Disposition: A | Discharge status: Home / Self Care | Payer: Commercial Managed Care - PPO | Source: Ambulatory Visit | Attending: Obstetrics and Gynecology | Admitting: Obstetrics and Gynecology

## 2016-09-18 DIAGNOSIS — N938 Other specified abnormal uterine and vaginal bleeding: Secondary | ICD-10-CM | POA: Insufficient documentation

## 2016-09-25 ENCOUNTER — Telehealth: Payer: Self-pay

## 2016-09-25 NOTE — Telephone Encounter (Signed)
Spoke with patient concerning u/s results. I have advised patient to follow up here in our office to discuss treatment options.

## 2016-09-25 NOTE — Telephone Encounter (Signed)
-----   Message from Chancy Milroy, MD sent at 09/24/2016  5:32 PM EST ----- Please let pt know that her U/S showed possible small fibroid @ 1 cm. Most likely not the cause of her bleeding. Recommend make appt with a physician to discuss treatment options Thanks Legrand Como

## 2017-06-19 ENCOUNTER — Emergency Department (HOSPITAL_BASED_OUTPATIENT_CLINIC_OR_DEPARTMENT_OTHER)
Admission: EM | Admit: 2017-06-19 | Discharge: 2017-06-19 | Disposition: A | Discharge status: Home / Self Care | Payer: BLUE CROSS/BLUE SHIELD | Attending: Emergency Medicine | Admitting: Emergency Medicine

## 2017-06-19 ENCOUNTER — Encounter (HOSPITAL_BASED_OUTPATIENT_CLINIC_OR_DEPARTMENT_OTHER): Payer: Self-pay

## 2017-06-19 ENCOUNTER — Other Ambulatory Visit: Payer: Self-pay

## 2017-06-19 DIAGNOSIS — K0889 Other specified disorders of teeth and supporting structures: Secondary | ICD-10-CM

## 2017-06-19 DIAGNOSIS — Z79899 Other long term (current) drug therapy: Secondary | ICD-10-CM | POA: Diagnosis not present

## 2017-06-19 MED ORDER — CLINDAMYCIN HCL 150 MG PO CAPS
450.0000 mg | ORAL_CAPSULE | Freq: Once | ORAL | Status: AC
  Administered 2017-06-19: 450 mg via ORAL
  Filled 2017-06-19: qty 3

## 2017-06-19 MED ORDER — BUPIVACAINE-EPINEPHRINE (PF) 0.5% -1:200000 IJ SOLN
1.8000 mL | Freq: Once | INTRAMUSCULAR | Status: AC
  Administered 2017-06-19: 1.8 mL
  Filled 2017-06-19: qty 1.8

## 2017-06-19 MED ORDER — IBUPROFEN 800 MG PO TABS
800.0000 mg | ORAL_TABLET | Freq: Once | ORAL | Status: AC
  Administered 2017-06-19: 800 mg via ORAL
  Filled 2017-06-19: qty 1

## 2017-06-19 MED ORDER — ACETAMINOPHEN 500 MG PO TABS
1000.0000 mg | ORAL_TABLET | Freq: Once | ORAL | Status: DC
  Filled 2017-06-19: qty 2

## 2017-06-19 MED ORDER — CLINDAMYCIN HCL 150 MG PO CAPS: 450.0000 mg | ORAL_CAPSULE | Freq: Four times a day (QID) | ORAL | 0 refills | Status: AC

## 2017-06-19 NOTE — ED Triage Notes (Signed)
Pt c/o facial swelling and and abscessed tooth, no dentist, pain started yesterday

## 2017-06-19 NOTE — ED Provider Notes (Addendum)
[facility] EMERGENCY DEPARTMENT Provider Note   CSN: [id number] Arrival date & time: 06/19/17  0107     History   Chief Complaint Chief Complaint  Patient presents with  . Dental Pain    HPI Paula Giles is a 31 y.o. female.  31 yo F with a chief complaint of left upper dental pain.  This is been going on since yesterday.  It is gotten significantly worse with some left-sided facial swelling as well.  She denies fevers or chills.  Worse with chewing or palpation.  Has not seen a dentist in some time.  Denies trauma.  Denies difficulty swallowing.   The history is provided by the patient.  Dental Pain   This is a new problem. The current episode started yesterday. The problem occurs constantly. The problem has been rapidly worsening. The pain is at a severity of 9/10. The pain is severe. She has tried nothing for the symptoms. The treatment provided no relief.    Past Medical History:  Diagnosis Date  . Anxiety   . BV (bacterial vaginosis)   . Irregular menstrual bleeding   . Mood disorder ([place])   . Seasonal allergies     There are no active problems to display for this patient.   Past Surgical History:  Procedure Laterality Date  . CESAREAN SECTION  2009  . WISDOM TOOTH EXTRACTION      OB History    Gravida Para Term Preterm AB Living   1 1 1          SAB TAB Ectopic Multiple Live Births                   Home Medications    Prior to Admission medications   Medication Sig Start Date End Date Taking? Authorizing Provider  acetaminophen (TYLENOL) 500 MG tablet Take 2 tablets (1,000 mg total) by mouth every 6 (six) hours as needed. Patient taking differently: Take 1,000 mg by mouth every 6 (six) hours as needed for mild pain or headache.  07/01/16   Gloriann Loan, PA-C  clindamycin (CLEOCIN) 150 MG capsule Take 3 capsules (450 mg total) every 6 (six) hours for 7 days by mouth. 06/19/17 06/26/17  Deno Etienne, DO  clonazePAM (KLONOPIN) 0.5 MG tablet  Take 1 tablet (0.5 mg total) by mouth 3 (three) times daily as needed for anxiety. 08/20/13   Harden Mo, MD  ibuprofen (ADVIL,MOTRIN) 800 MG tablet Take 1 tablet (800 mg total) by mouth 3 (three) times daily. Patient not taking: Reported on 09/11/2016 07/01/16   Gloriann Loan, PA-C  ibuprofen (ADVIL,MOTRIN) 800 MG tablet Take 1 tablet (800 mg total) by mouth 3 (three) times daily with meals as needed for headache or moderate pain. 09/11/16   Chancy Milroy, MD  metroNIDAZOLE (FLAGYL) 500 MG tablet Take 1 tablet (500 mg total) by mouth 2 (two) times daily. 09/11/16   Chancy Milroy, MD  oxymetazoline (AFRIN) 0.05 % nasal spray Place 1 spray into both nostrils 2 (two) times daily as needed for congestion.    [provider]  Pseudoephedrine HCl (SUDAFED PO) Take 2 tablets by mouth daily as needed (for congestion).    [provider]  traMADol (ULTRAM) 50 MG tablet Take 1 tablet (50 mg total) by mouth every 6 (six) hours as needed. Patient taking differently: Take 50 mg by mouth every 6 (six) hours as needed for moderate pain.  07/13/16   Veryl Speak, MD  vortioxetine HBr (TRINTELLIX) 5  MG TABS Take 5 mg by mouth daily.     [provider]    Family History No family history on file.  Social History Social History   Tobacco Use  . Smoking status: Never Smoker  . Smokeless tobacco: Never Used  Substance Use Topics  . Alcohol use: Yes    Comment: socially  . Drug use: No     Allergies   Latex   Review of Systems Review of Systems  Constitutional: Negative for chills and fever.  HENT: Positive for dental problem and facial swelling. Negative for congestion and rhinorrhea.   Eyes: Negative for redness and visual disturbance.  Respiratory: Negative for shortness of breath and wheezing.   Cardiovascular: Negative for chest pain and palpitations.  Gastrointestinal: Negative for nausea and vomiting.  Genitourinary: Negative for dysuria and urgency.    Musculoskeletal: Negative for arthralgias and myalgias.  Skin: Negative for pallor and wound.  Neurological: Negative for dizziness and headaches.     Physical Exam Updated Vital Signs BP (!) 159/88 (BP Location: Right Arm)   Pulse 84   Temp 98 F (36.7 C) (Oral)   Resp 16   Ht 5\' 4"  (1.626 m)   Wt 117.9 kg (260 lb)   LMP 06/15/2017   SpO2 98%   BMI 44.63 kg/m   Physical Exam  Constitutional: She is oriented to person, place, and time. She appears well-developed and well-nourished. No distress.  HENT:  Head: Normocephalic and atraumatic.  Mild swelling to the right maxillary region.  She has some pain to the root of the left canine.  No noted fluctuance.  Tolerating secretions without difficulty.  Eyes: EOM are normal. Pupils are equal, round, and reactive to light.  Neck: Normal range of motion. Neck supple.  Cardiovascular: Normal rate and regular rhythm. Exam reveals no gallop and no friction rub.  No murmur heard. Pulmonary/Chest: Effort normal. She has no wheezes. She has no rales.  Abdominal: Soft. She exhibits no distension. There is no tenderness.  Musculoskeletal: She exhibits no edema or tenderness.  Neurological: She is alert and oriented to person, place, and time.  Skin: Skin is warm and dry. She is not diaphoretic.  Psychiatric: She has a normal mood and affect. Her behavior is normal.  Nursing note and vitals reviewed.    ED Treatments / Results  Labs (all labs ordered are listed, but only abnormal results are displayed) Labs Reviewed - No data to display  EKG  EKG Interpretation None       Radiology No results found.  Procedures Dental Block Date/Time: 06/19/2017 3:07 AM Performed by: Deno Etienne, DO Authorized by: Deno Etienne, DO   Consent:    Consent obtained:  Verbal   Consent given by:  Patient   Risks discussed:  Allergic reaction, infection, intravascular injection, hematoma, pain, nerve damage, swelling and unsuccessful block    Alternatives discussed:  No treatment, delayed treatment and alternative treatment Indications:    Indications: dental pain   Location:    Block type:  Anterior superior alveolar   Laterality:  Left Procedure details (see MAR for exact dosages):    Topical anesthetic:  Benzocaine spray   Syringe type:  Aspirating dental syringe   Needle gauge:  27 G   Anesthetic injected:  Bupivacaine 0.25% WITH epi   Injection procedure:  Anatomic landmarks identified, anatomic landmarks palpated, negative aspiration for blood and incremental injection Post-procedure details:    Outcome:  Anesthesia achieved   Patient tolerance of procedure:  Tolerated well, no immediate complications    (including critical care time)  Medications Ordered in ED Medications  acetaminophen (TYLENOL) tablet 1,000 mg (1,000 mg Oral Not Given 06/19/17 0239)  bupivacaine-epinephrine (MARCAINE W/ EPI) 0.5% -1:200000 injection 1.8 mL (1.8 mLs Infiltration Given by Other 06/19/17 0242)  ibuprofen (ADVIL,MOTRIN) tablet 800 mg (800 mg Oral Given 06/19/17 0239)  clindamycin (CLEOCIN) capsule 450 mg (450 mg Oral Given 06/19/17 0239)     Initial Impression / Assessment and Plan / ED Course  I have reviewed the triage vital signs and the nursing notes.  Pertinent labs & imaging results that were available during my care of the patient were reviewed by me and considered in my medical decision making (see chart for details).     31 yo F with a chief complaint of left upper dental pain.  Dental block performed with significant improvement of her symptoms.  Given dental follow-up.  With facial swelling will start on antibiotics.  3:08 AM:  I have discussed the diagnosis/risks/treatment options with the patient and believe the pt to be eligible for discharge home to follow-up with PCP, dentist. We also discussed returning to the ED immediately if new or worsening sx occur. We discussed the sx which are most concerning (e.g., sudden  worsening pain, fever, inability to tolerate by mouth) that necessitate immediate return. Medications administered to the patient during their visit and any new prescriptions provided to the patient are listed below.  Medications given during this visit Medications  acetaminophen (TYLENOL) tablet 1,000 mg (1,000 mg Oral Not Given 06/19/17 0239)  bupivacaine-epinephrine (MARCAINE W/ EPI) 0.5% -1:200000 injection 1.8 mL (1.8 mLs Infiltration Given by Other 06/19/17 0242)  ibuprofen (ADVIL,MOTRIN) tablet 800 mg (800 mg Oral Given 06/19/17 0239)  clindamycin (CLEOCIN) capsule 450 mg (450 mg Oral Given 06/19/17 0239)     The patient appears reasonably screen and/or stabilized for discharge and I doubt any other medical condition or other [facility] requiring further screening, evaluation, or treatment in the ED at this time prior to discharge.    Final Clinical Impressions(s) / ED Diagnoses   Final diagnoses:  Pain, dental    ED Discharge Orders        Ordered    clindamycin (CLEOCIN) 150 MG capsule  Every 6 hours     06/19/17 0244       Deno Etienne, DO 06/19/17 [place], [place], DO 06/19/17 [id number]

## 2017-06-19 NOTE — Discharge Instructions (Signed)
Take 4 over the counter ibuprofen tablets 3 times a day or 2 over-the-counter naproxen tablets twice a day for pain. Also take tylenol 1000mg(2 extra strength) four times a day.    

## 2017-08-28 ENCOUNTER — Emergency Department (HOSPITAL_BASED_OUTPATIENT_CLINIC_OR_DEPARTMENT_OTHER)
Admission: EM | Admit: 2017-08-28 | Discharge: 2017-08-28 | Disposition: A | Discharge status: Home / Self Care | Payer: BLUE CROSS/BLUE SHIELD | Attending: Emergency Medicine | Admitting: Emergency Medicine

## 2017-08-28 ENCOUNTER — Emergency Department (HOSPITAL_BASED_OUTPATIENT_CLINIC_OR_DEPARTMENT_OTHER): Payer: BLUE CROSS/BLUE SHIELD

## 2017-08-28 ENCOUNTER — Encounter (HOSPITAL_BASED_OUTPATIENT_CLINIC_OR_DEPARTMENT_OTHER): Payer: Self-pay | Admitting: *Deleted

## 2017-08-28 ENCOUNTER — Other Ambulatory Visit: Payer: Self-pay

## 2017-08-28 DIAGNOSIS — Z79899 Other long term (current) drug therapy: Secondary | ICD-10-CM | POA: Diagnosis not present

## 2017-08-28 DIAGNOSIS — Z91040 Latex allergy status: Secondary | ICD-10-CM | POA: Diagnosis not present

## 2017-08-28 DIAGNOSIS — J069 Acute upper respiratory infection, unspecified: Secondary | ICD-10-CM | POA: Insufficient documentation

## 2017-08-28 DIAGNOSIS — B9789 Other viral agents as the cause of diseases classified elsewhere: Secondary | ICD-10-CM | POA: Insufficient documentation

## 2017-08-28 DIAGNOSIS — R002 Palpitations: Secondary | ICD-10-CM | POA: Diagnosis present

## 2017-08-28 LAB — COMPREHENSIVE METABOLIC PANEL
ALT: 35 U/L (ref 14–54)
AST: 36 U/L (ref 15–41)
Albumin: 4 g/dL (ref 3.5–5.0)
Alkaline Phosphatase: 70 U/L (ref 38–126)
Anion gap: 8 (ref 5–15)
BUN: 13 mg/dL (ref 6–20)
CHLORIDE: 102 mmol/L (ref 101–111)
CO2: 26 mmol/L (ref 22–32)
CREATININE: 0.87 mg/dL (ref 0.44–1.00)
Calcium: 9.1 mg/dL (ref 8.9–10.3)
GFR calc Af Amer: >60 mL/min (ref >60)
Glucose, Bld: 103 mg/dL — ABNORMAL HIGH (ref 65–99)
Potassium: 3.6 mmol/L (ref 3.5–5.1)
Sodium: 136 mmol/L (ref 135–145)
Total Bilirubin: 0.5 mg/dL (ref 0.3–1.2)
Total Protein: 7.7 g/dL (ref 6.5–8.1)

## 2017-08-28 LAB — CBC
HCT: 37.8 % (ref 36.0–46.0)
Hemoglobin: 13 g/dL (ref 12.0–15.0)
MCH: 31.6 pg (ref 26.0–34.0)
MCHC: 34.4 g/dL (ref 30.0–36.0)
MCV: 92 fL (ref 78.0–100.0)
Platelets: 319 10*3/uL (ref 150–400)
RBC: 4.11 MIL/uL (ref 3.87–5.11)
RDW: 12.4 % (ref 11.5–15.5)
WBC: 8.7 10*3/uL (ref 4.0–10.5)

## 2017-08-28 MED ORDER — GI COCKTAIL ~~LOC~~
30.0000 mL | Freq: Once | ORAL | Status: AC
  Administered 2017-08-28: 30 mL via ORAL
  Filled 2017-08-28: qty 30

## 2017-08-28 NOTE — ED Triage Notes (Signed)
Feels rumbling in chest  Which causes cough  X 6 days    States takes a asa and it calms down till next day,  Denies pain

## 2017-08-28 NOTE — ED Provider Notes (Signed)
[facility] EMERGENCY DEPARTMENT Provider Note   CSN: [id number] Arrival date & time: 08/28/17  0111     History   Chief Complaint Chief Complaint  Patient presents with  . Cough    HPI Paula Giles is a 32 y.o. female.  HPI Patient reports intermittent palpitations of the past 6 days.  No new medications.  She denies excessive caffeine intake.  She reports a feeling of "fluttering that is transient and no prior history of arrhythmia.  Denies fevers and chills.  Denies productive cough.  Is never had symptoms like this before which is what concerned her.  She has not checked her own pulse.  No family history of early cardiac death.   Past Medical History:  Diagnosis Date  . Anxiety   . BV (bacterial vaginosis)   . Irregular menstrual bleeding   . Mood disorder ([place])   . Seasonal allergies     There are no active problems to display for this patient.   Past Surgical History:  Procedure Laterality Date  . CESAREAN SECTION  2009  . WISDOM TOOTH EXTRACTION      OB History    Gravida Para Term Preterm AB Living   1 1 1          SAB TAB Ectopic Multiple Live Births                   Home Medications    Prior to Admission medications   Medication Sig Start Date End Date Taking? Authorizing Provider  acetaminophen (TYLENOL) 500 MG tablet Take 2 tablets (1,000 mg total) by mouth every 6 (six) hours as needed. Patient taking differently: Take 1,000 mg by mouth every 6 (six) hours as needed for mild pain or headache.  07/01/16   Gloriann Loan, PA-C  clonazePAM (KLONOPIN) 0.5 MG tablet Take 1 tablet (0.5 mg total) by mouth 3 (three) times daily as needed for anxiety. 08/20/13   Harden Mo, MD  ibuprofen (ADVIL,MOTRIN) 800 MG tablet Take 1 tablet (800 mg total) by mouth 3 (three) times daily. Patient not taking: Reported on 09/11/2016 07/01/16   Gloriann Loan, PA-C  ibuprofen (ADVIL,MOTRIN) 800 MG tablet Take 1 tablet (800 mg total) by mouth 3 (three) times daily  with meals as needed for headache or moderate pain. 09/11/16   Chancy Milroy, MD  metroNIDAZOLE (FLAGYL) 500 MG tablet Take 1 tablet (500 mg total) by mouth 2 (two) times daily. 09/11/16   Chancy Milroy, MD  oxymetazoline (AFRIN) 0.05 % nasal spray Place 1 spray into both nostrils 2 (two) times daily as needed for congestion.    [provider]  Pseudoephedrine HCl (SUDAFED PO) Take 2 tablets by mouth daily as needed (for congestion).    [provider]  traMADol (ULTRAM) 50 MG tablet Take 1 tablet (50 mg total) by mouth every 6 (six) hours as needed. Patient taking differently: Take 50 mg by mouth every 6 (six) hours as needed for moderate pain.  07/13/16   Veryl Speak, MD  vortioxetine HBr (TRINTELLIX) 5 MG TABS Take 5 mg by mouth daily.     [provider]    Family History No family history on file.  Social History Social History   Tobacco Use  . Smoking status: Never Smoker  . Smokeless tobacco: Never Used  Substance Use Topics  . Alcohol use: Yes    Comment: socially  . Drug use: No     Allergies   Latex  Review of Systems Review of Systems  All other systems reviewed and are negative.    Physical Exam Updated Vital Signs BP (!) 92/56   Pulse 82   Temp 98.2 F (36.8 C)   Resp 19   Ht 5\' 5"  (1.651 m)   Wt 117.9 kg (260 lb)   SpO2 100%   BMI 43.27 kg/m   Physical Exam  Constitutional: She is oriented to person, place, and time. She appears well-developed and well-nourished.  HENT:  Head: Normocephalic.  Eyes: EOM are normal.  Neck: Normal range of motion.  Cardiovascular: Normal rate, regular rhythm and normal heart sounds.  Pulmonary/Chest: Effort normal and breath sounds normal. No stridor. No respiratory distress. She has no wheezes.  Abdominal: Soft. She exhibits no distension. There is no tenderness.  Musculoskeletal: Normal range of motion.  Neurological: She is alert and oriented to person, place, and time.    Psychiatric: She has a normal mood and affect.  Nursing note and vitals reviewed.    ED Treatments / Results  Labs (all labs ordered are listed, but only abnormal results are displayed) Labs Reviewed  COMPREHENSIVE METABOLIC PANEL - Abnormal; Notable for the following components:      Result Value   Glucose, Bld 103 (*)    All other components within normal limits  CBC    EKG  EKG Interpretation  Date/Time:  Friday August 28 2017 01:18:53 EST Ventricular Rate:  85 PR Interval:    QRS Duration: 86 QT Interval:  362 QTC Calculation: 431 R Axis:   51 Text Interpretation:  Sinus rhythm Low voltage, precordial leads Borderline T abnormalities, inferior leads Baseline wander in lead(s) II III aVF No significant change was found Confirmed by Jola Schmidt [phone]) on 08/28/2017 2:22:48 AM       Radiology Dg Chest 2 View  Result Date: 08/28/2017 CLINICAL DATA:  Cough with chest discomfort EXAM: CHEST  2 VIEW COMPARISON:  02/12/2016 FINDINGS: The heart size and mediastinal contours are within normal limits. Both lungs are clear. The visualized skeletal structures are unremarkable. IMPRESSION: No active cardiopulmonary disease. Electronically Signed   By: Donavan Foil M.D.   On: 08/28/2017 02:17    Procedures Procedures (including critical care time)  Medications Ordered in ED Medications  gi cocktail (Maalox,Lidocaine,Donnatal) (30 mLs Oral Given 08/28/17 0304)     Initial Impression / Assessment and Plan / ED Course  I have reviewed the triage vital signs and the nursing notes.  Pertinent labs & imaging results that were available during my care of the patient were reviewed by me and considered in my medical decision making (see chart for details).     Overall the patient is well-appearing.  Her electrolytes and chest x-ray and EKG are without significant abnormality.  No ectopy or arrhythmias noted while on monitor here in the emergency department.  Plan for discharge  home primary care follow-up.  She understands to return to the emergency department for new or worsening symptoms  Final Clinical Impressions(s) / ED Diagnoses   Final diagnoses:  Palpitations  Viral URI with cough    ED Discharge Orders    None       Jola Schmidt, MD 08/28/17 [phone]

## 2018-01-04 IMAGING — US US PELVIS COMPLETE
1 series · 15 of 25 positions shown · non-contrast
Comparison: Pelvic ultrasound 04/01/2013.

CLINICAL DATA: Patient with irregular menstrual cycles and
dysfunctional uterine bleeding.



[Series 1: us pelvis complete · 15 of 76 slices shown]
[im 1/76]
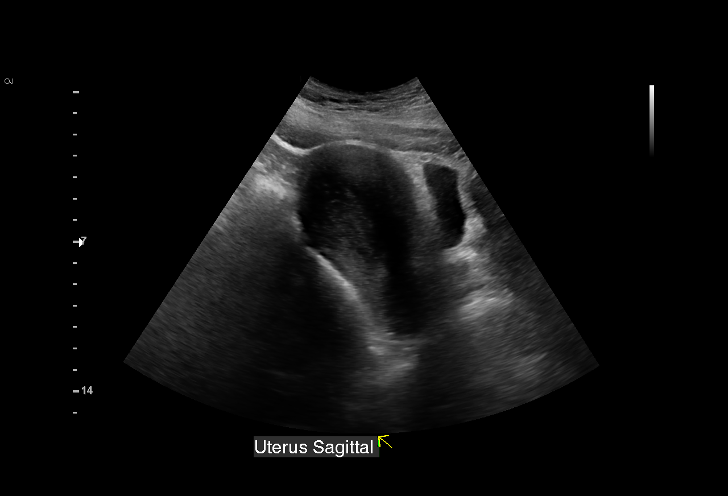
[im 7/76]
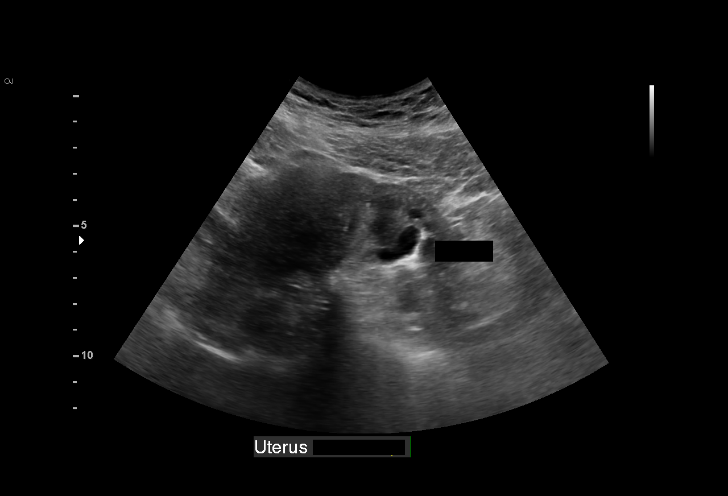
[im 13/76]
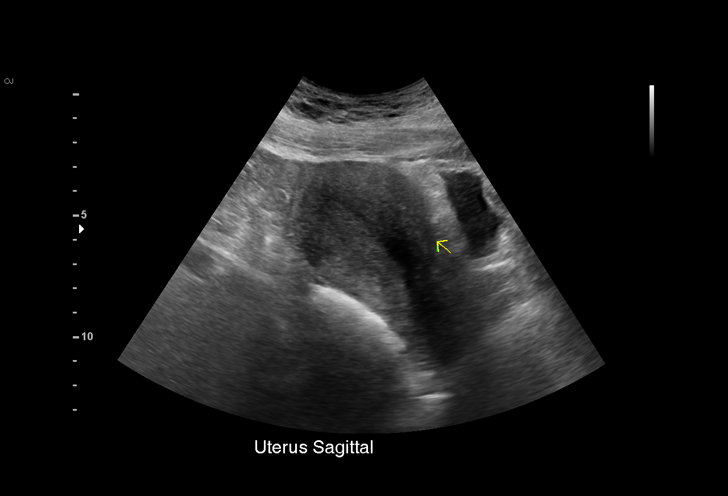
[im 16/76]
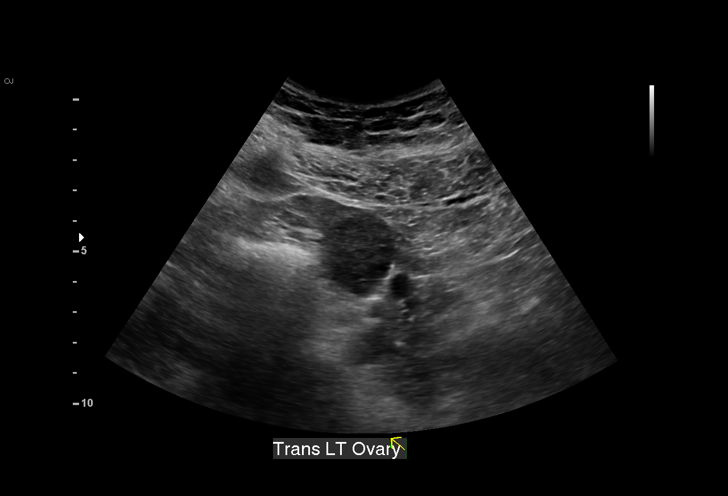
[im 22/76]
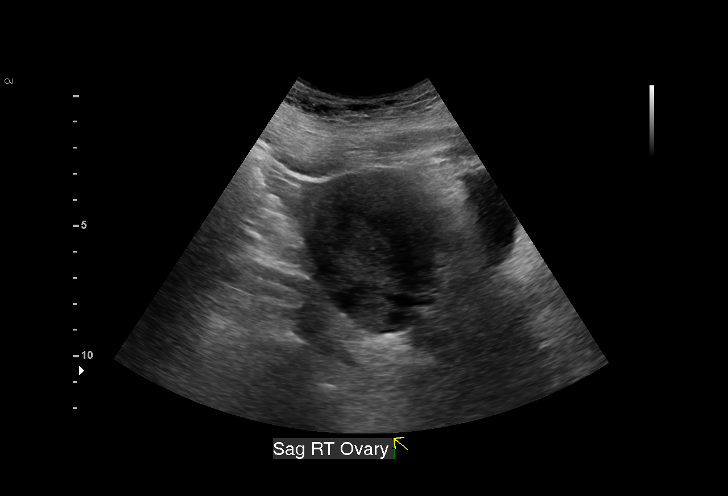
[im 29/76]
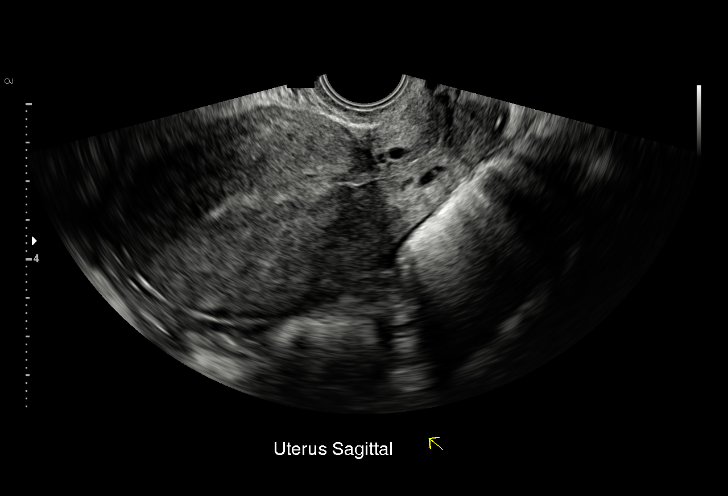
[im 32/76]
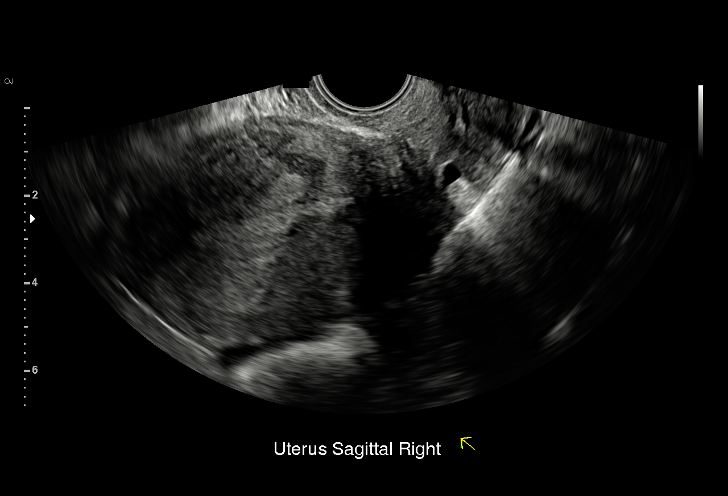
[im 38/76]
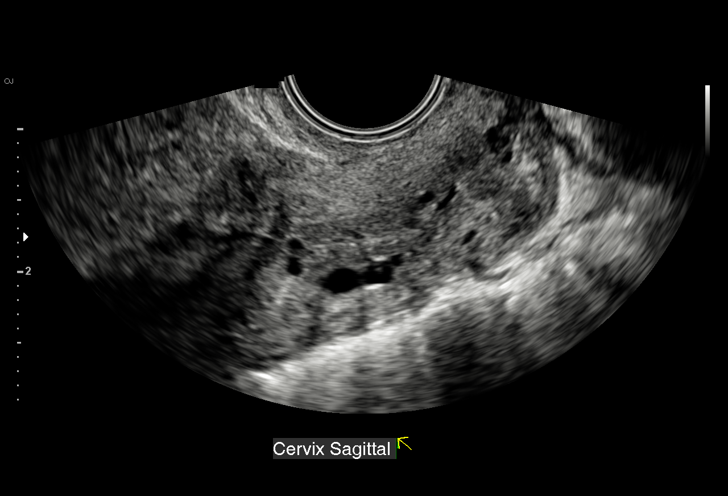
[im 44/76]
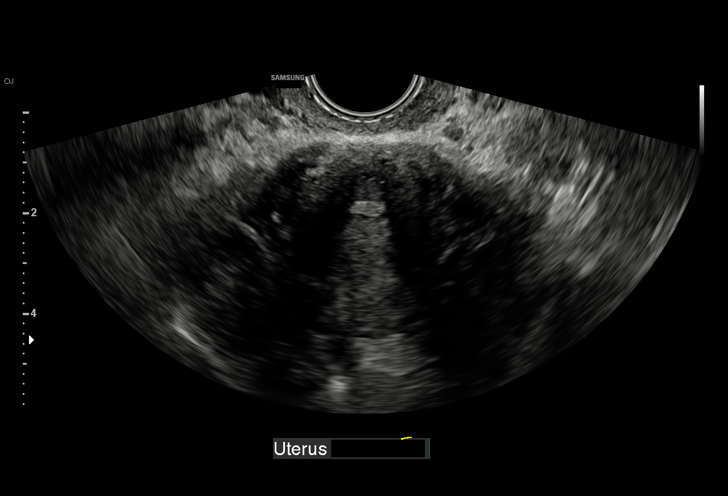
[im 47/76]
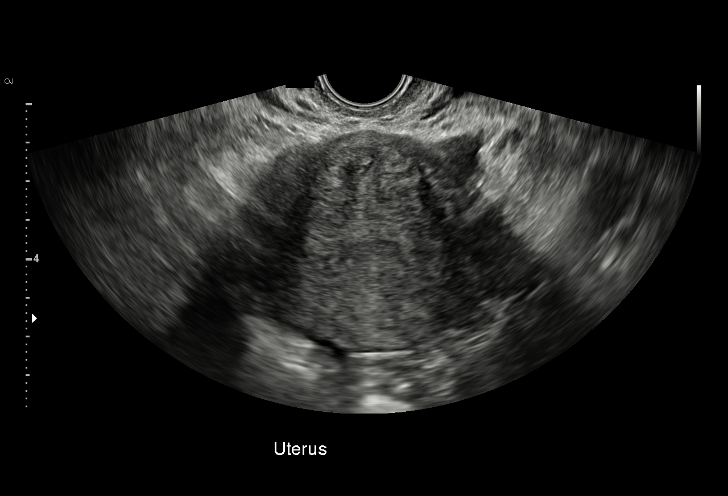
[im 54/76]
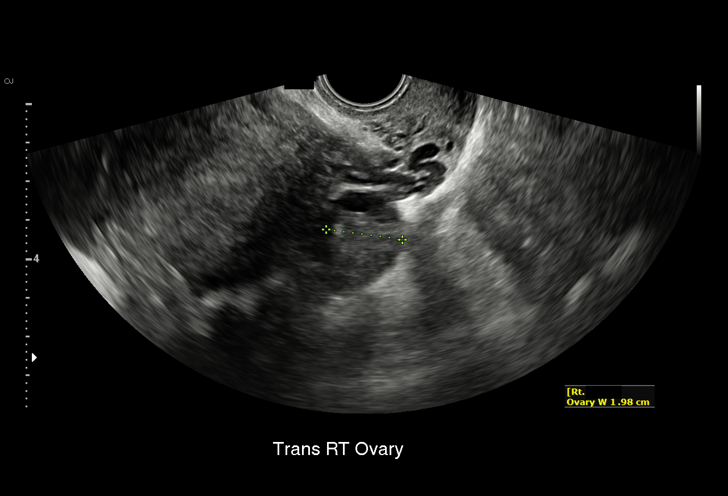
[im 60/76]
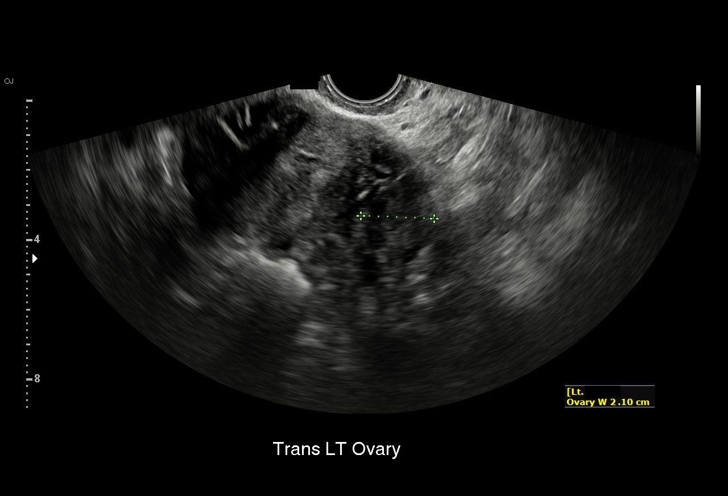
[im 63/76]
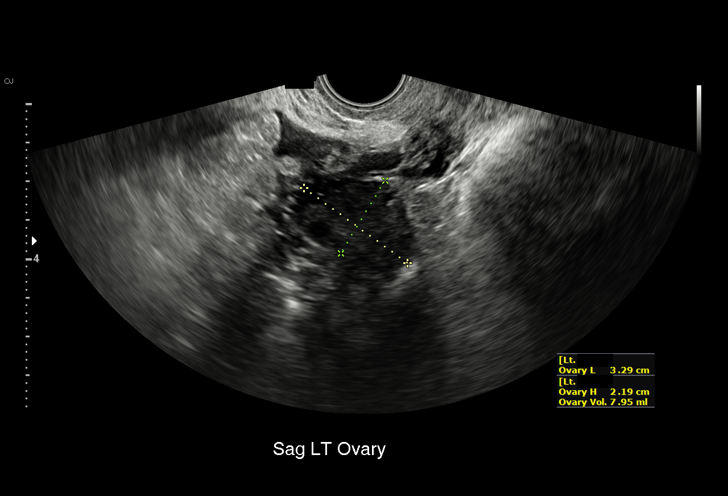
[im 69/76]
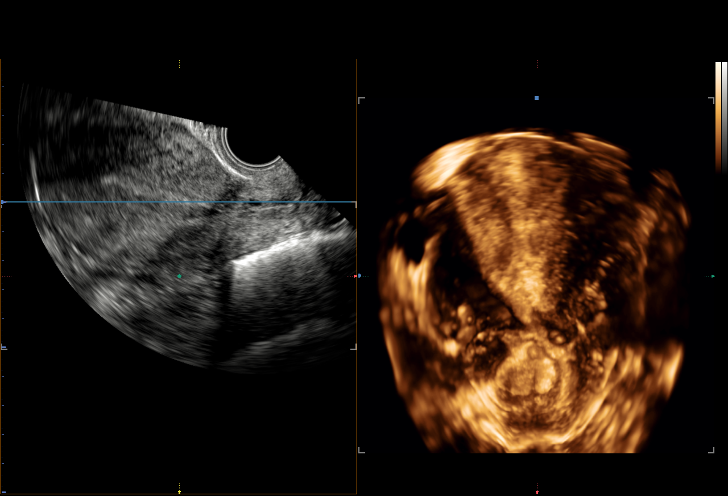
[im 76/76]
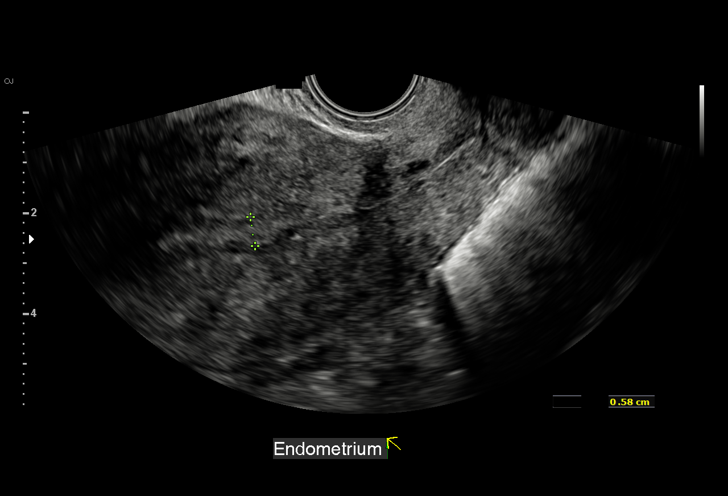

[15 of 25 positions shown; findings below may reference images not displayed]

FINDINGS: Uterus

Measurements: 10.6 x 5.3 x 5.6 cm. Small 11 mm mass within the
anterior uterine body potentially representing a small fibroid.

Endometrium

Thickness: 6 mm.  No focal abnormality visualized.

Right ovary

Measurements: 3.2 x 2.1 x 2.0 cm. Normal appearance/no adnexal mass.

Left ovary

Measurements: 3.4 x 2.7 x 2.5 cm. Normal appearance/no adnexal mass.

Other findings

No abnormal free fluid.
IMPRESSION: Endometrium measures 6 mm. If bleeding remains unresponsive to
hormonal or medical therapy, sonohysterogram should be considered
for focal lesion work-up. (Ref: Radiological Reasoning: Algorithmic
Workup of Abnormal Vaginal Bleeding with Endovaginal Sonography and
Sonohysterography. [id number]; [id number])

## 2018-01-14 ENCOUNTER — Emergency Department (HOSPITAL_BASED_OUTPATIENT_CLINIC_OR_DEPARTMENT_OTHER)
Admission: EM | Admit: 2018-01-14 | Discharge: 2018-01-14 | Disposition: A | Discharge status: Home / Self Care | Payer: Self-pay | Attending: Emergency Medicine | Admitting: Emergency Medicine

## 2018-01-14 ENCOUNTER — Encounter (HOSPITAL_BASED_OUTPATIENT_CLINIC_OR_DEPARTMENT_OTHER): Payer: Self-pay | Admitting: *Deleted

## 2018-01-14 ENCOUNTER — Emergency Department (HOSPITAL_BASED_OUTPATIENT_CLINIC_OR_DEPARTMENT_OTHER): Payer: Self-pay

## 2018-01-14 ENCOUNTER — Other Ambulatory Visit: Payer: Self-pay

## 2018-01-14 DIAGNOSIS — R319 Hematuria, unspecified: Secondary | ICD-10-CM | POA: Insufficient documentation

## 2018-01-14 DIAGNOSIS — M545 Low back pain, unspecified: Secondary | ICD-10-CM

## 2018-01-14 LAB — URINALYSIS, MICROSCOPIC (REFLEX): RBC / HPF: >50 RBC/hpf (ref 0–5)

## 2018-01-14 LAB — PREGNANCY, URINE: Preg Test, Ur: NEGATIVE

## 2018-01-14 LAB — URINALYSIS, ROUTINE W REFLEX MICROSCOPIC
BILIRUBIN URINE: NEGATIVE
GLUCOSE, UA: NEGATIVE mg/dL
KETONES UR: NEGATIVE mg/dL
Leukocytes, UA: NEGATIVE
Nitrite: NEGATIVE
PH: 5.5 (ref 5.0–8.0)
PROTEIN: 100 mg/dL — AB
Specific Gravity, Urine: >1.03 — ABNORMAL HIGH (ref 1.005–1.030)

## 2018-01-14 LAB — CBC
HCT: 35.7 % — ABNORMAL LOW (ref 36.0–46.0)
Hemoglobin: 12.2 g/dL (ref 12.0–15.0)
MCH: 31 pg (ref 26.0–34.0)
MCHC: 34.2 g/dL (ref 30.0–36.0)
MCV: 90.6 fL (ref 78.0–100.0)
Platelets: 288 10*3/uL (ref 150–400)
RBC: 3.94 MIL/uL (ref 3.87–5.11)
RDW: 13 % (ref 11.5–15.5)
WBC: 7.6 10*3/uL (ref 4.0–10.5)

## 2018-01-14 LAB — BASIC METABOLIC PANEL
Anion gap: 3 — ABNORMAL LOW (ref 5–15)
BUN: 11 mg/dL (ref 6–20)
CHLORIDE: 108 mmol/L (ref 101–111)
CO2: 25 mmol/L (ref 22–32)
CREATININE: 0.88 mg/dL (ref 0.44–1.00)
Calcium: 8.1 mg/dL — ABNORMAL LOW (ref 8.9–10.3)
GFR calc Af Amer: >60 mL/min (ref >60)
GFR calc non Af Amer: >60 mL/min (ref >60)
GLUCOSE: 90 mg/dL (ref 65–99)
Potassium: 3.9 mmol/L (ref 3.5–5.1)
Sodium: 136 mmol/L (ref 135–145)

## 2018-01-14 MED ORDER — CEPHALEXIN 250 MG PO CAPS
500.0000 mg | ORAL_CAPSULE | Freq: Once | ORAL | Status: AC
  Administered 2018-01-14: 500 mg via ORAL
  Filled 2018-01-14: qty 2

## 2018-01-14 MED ORDER — HYDROCODONE-ACETAMINOPHEN 5-325 MG PO TABS: 1.0000 | ORAL_TABLET | Freq: Four times a day (QID) | ORAL | 0 refills | Status: DC | PRN

## 2018-01-14 MED ORDER — ONDANSETRON 4 MG PO TBDP
4.0000 mg | ORAL_TABLET | Freq: Once | ORAL | Status: AC
  Administered 2018-01-14: 4 mg via ORAL
  Filled 2018-01-14: qty 1

## 2018-01-14 MED ORDER — CEPHALEXIN 500 MG PO CAPS: 500.0000 mg | ORAL_CAPSULE | Freq: Four times a day (QID) | ORAL | 0 refills | Status: DC

## 2018-01-14 MED ORDER — ONDANSETRON 4 MG PO TBDP: 4.0000 mg | ORAL_TABLET | Freq: Three times a day (TID) | ORAL | 1 refills | Status: DC | PRN

## 2018-01-14 MED ORDER — HYDROMORPHONE HCL 1 MG/ML IJ SOLN
2.0000 mg | Freq: Once | INTRAMUSCULAR | Status: AC
  Administered 2018-01-14: 2 mg via INTRAMUSCULAR
  Filled 2018-01-14: qty 2

## 2018-01-14 NOTE — ED Triage Notes (Signed)
Hematuria today. Pain in her lower back yesterday.

## 2018-01-14 NOTE — ED Provider Notes (Signed)
[facility] Provider Note   CSN: [id number] Arrival date & time: 01/14/18  1649     History   Chief Complaint Chief Complaint  Patient presents with  . Hematuria    HPI Paula Giles is a 32 y.o. female.  Patient presenting with a complaint of blood in urine and left-sided low back pain that started yesterday.  Patient states been having difficulty urinating since last night.  Back pain does not radiate into the leg.  No history of kidney stones.  Patient's had nausea but no vomiting no fevers.  Difficulty urinating but no dysuria.  The back pain is made worse with movement.     Past Medical History:  Diagnosis Date  . Anxiety   . BV (bacterial vaginosis)   . Irregular menstrual bleeding   . Mood disorder ([place])   . Seasonal allergies     There are no active problems to display for this patient.   Past Surgical History:  Procedure Laterality Date  . CESAREAN SECTION  2009  . WISDOM TOOTH EXTRACTION       OB History    Gravida  1   Para  1   Term  1   Preterm      AB      Living        SAB      TAB      Ectopic      Multiple      Live Births               Home Medications    Prior to Admission medications   Medication Sig Start Date End Date Taking? Authorizing Provider  acetaminophen (TYLENOL) 500 MG tablet Take 2 tablets (1,000 mg total) by mouth every 6 (six) hours as needed. Patient taking differently: Take 1,000 mg by mouth every 6 (six) hours as needed for mild pain or headache.  07/01/16   Gloriann Loan, PA-C  cephALEXin (KEFLEX) 500 MG capsule Take 1 capsule (500 mg total) by mouth 4 (four) times daily. 01/14/18   Fredia Sorrow, MD  clonazePAM (KLONOPIN) 0.5 MG tablet Take 1 tablet (0.5 mg total) by mouth 3 (three) times daily as needed for anxiety. 08/20/13   Harden Mo, MD  HYDROcodone-acetaminophen (NORCO/VICODIN) 5-325 MG tablet Take 1-2 tablets by mouth every 6 (six) hours as needed for  moderate pain. 01/14/18   Fredia Sorrow, MD  ibuprofen (ADVIL,MOTRIN) 800 MG tablet Take 1 tablet (800 mg total) by mouth 3 (three) times daily. Patient not taking: Reported on 09/11/2016 07/01/16   Gloriann Loan, PA-C  ibuprofen (ADVIL,MOTRIN) 800 MG tablet Take 1 tablet (800 mg total) by mouth 3 (three) times daily with meals as needed for headache or moderate pain. 09/11/16   Chancy Milroy, MD  metroNIDAZOLE (FLAGYL) 500 MG tablet Take 1 tablet (500 mg total) by mouth 2 (two) times daily. 09/11/16   Chancy Milroy, MD  ondansetron (ZOFRAN ODT) 4 MG disintegrating tablet Take 1 tablet (4 mg total) by mouth every 8 (eight) hours as needed. 01/14/18   Fredia Sorrow, MD  oxymetazoline (AFRIN) 0.05 % nasal spray Place 1 spray into both nostrils 2 (two) times daily as needed for congestion.    [provider]  Pseudoephedrine HCl (SUDAFED PO) Take 2 tablets by mouth daily as needed (for congestion).    [provider]  traMADol (ULTRAM) 50 MG tablet Take 1 tablet (50 mg total) by mouth every 6 (six) hours  as needed. Patient taking differently: Take 50 mg by mouth every 6 (six) hours as needed for moderate pain.  07/13/16   Veryl Speak, MD  vortioxetine HBr (TRINTELLIX) 5 MG TABS Take 5 mg by mouth daily.     [provider]    Family History No family history on file.  Social History Social History   Tobacco Use  . Smoking status: Never Smoker  . Smokeless tobacco: Never Used  Substance Use Topics  . Alcohol use: Yes    Comment: socially  . Drug use: No     Allergies   Latex   Review of Systems Review of Systems  Constitutional: Negative for fever.  HENT: Negative for congestion.   Eyes: Negative for visual disturbance.  Respiratory: Negative for shortness of breath.   Cardiovascular: Negative for chest pain.  Gastrointestinal: Positive for nausea. Negative for abdominal pain and vomiting.  Genitourinary: Positive for difficulty urinating and  hematuria.  Musculoskeletal: Positive for back pain.  Skin: Negative for rash.  Neurological: Negative for weakness and numbness.  Hematological: Does not bruise/bleed easily.  Psychiatric/Behavioral: Negative for confusion.     Physical Exam Updated Vital Signs BP (!) 142/73 (BP Location: Right Arm)   Pulse 75   Temp 97.6 F (36.4 C) (Oral)   Resp 18   Ht 1.651 m (5\' 5" )   Wt 117.5 kg (259 lb)   LMP 12/21/2017 Comment: neg preg test  SpO2 95%   BMI 43.10 kg/m   Physical Exam  Constitutional: She is oriented to person, place, and time. She appears well-developed and well-nourished. No distress.  HENT:  Head: Normocephalic and atraumatic.  Mouth/Throat: Oropharynx is clear and moist.  Eyes: Pupils are equal, round, and reactive to light. Conjunctivae and EOM are normal.  Neck: Neck supple.  Cardiovascular: Normal rate, regular rhythm and normal heart sounds.  Pulmonary/Chest: Effort normal and breath sounds normal.  Abdominal: Soft. Bowel sounds are normal. There is no tenderness.  Musculoskeletal: Normal range of motion.  Some tenderness to palpation to left lumbar area not specifically over the CVA area.  Neurological: She is alert and oriented to person, place, and time. No cranial nerve deficit. She exhibits normal muscle tone. Coordination normal.  Skin: Skin is warm.  Nursing note and vitals reviewed.    ED Treatments / Results  Labs (all labs ordered are listed, but only abnormal results are displayed) Labs Reviewed  URINALYSIS, ROUTINE W REFLEX MICROSCOPIC - Abnormal; Notable for the following components:      Result Value   APPearance CLOUDY (*)    Specific Gravity, Urine >1.030 (*)    Hgb urine dipstick LARGE (*)    Protein, ur 100 (*)    All other components within normal limits  BASIC METABOLIC PANEL - Abnormal; Notable for the following components:   Calcium 8.1 (*)    Anion gap 3 (*)    All other components within normal limits  CBC - Abnormal;  Notable for the following components:   HCT 35.7 (*)    All other components within normal limits  URINALYSIS, MICROSCOPIC (REFLEX) - Abnormal; Notable for the following components:   Bacteria, UA FEW (*)    All other components within normal limits  URINE CULTURE  PREGNANCY, URINE    EKG None  Radiology Ct Renal Stone Study  Result Date: 01/14/2018 CLINICAL DATA:  Hematuria with nausea, left lower quadrant pain today. EXAM: CT ABDOMEN AND PELVIS WITHOUT CONTRAST TECHNIQUE: Multidetector CT imaging of the abdomen and  pelvis was performed following the standard protocol without IV contrast. COMPARISON:  None. FINDINGS: Lower chest: Normal. Hepatobiliary: Mild diffuse low-attenuation of the liver without focal mass. Gallbladder and biliary tree are within normal. Pancreas: Normal. Spleen: Normal. Adrenals/Urinary Tract: Adrenal glands are normal. Kidneys are normal in size without hydronephrosis or nephrolithiasis. Ureters are within normal. Bladder is normal. Stomach/Bowel: Stomach and small bowel are within normal. Appendix is normal. Colon is normal. Vascular/Lymphatic: Normal. Reproductive: Normal. Other: No free fluid or focal inflammatory change. Musculoskeletal: Within normal. IMPRESSION: No acute findings in the abdomen/pelvis. No evidence of renal/ureteral stones or obstruction. Electronically Signed   By: Marin Olp M.D.   On: 01/14/2018 18:19    Procedures Procedures (including critical care time)  Medications Ordered in ED Medications  HYDROmorphone (DILAUDID) injection 2 mg (2 mg Intramuscular Given 01/14/18 1741)  ondansetron (ZOFRAN-ODT) disintegrating tablet 4 mg (4 mg Oral Given 01/14/18 1740)  cephALEXin (KEFLEX) capsule 500 mg (500 mg Oral Given 01/14/18 1946)  ondansetron (ZOFRAN-ODT) disintegrating tablet 4 mg (4 mg Oral Given 01/14/18 1920)     Initial Impression / Assessment and Plan / ED Course  I have reviewed the triage vital signs and the nursing  notes.  Pertinent labs & imaging results that were available during my care of the patient were reviewed by me and considered in my medical decision making (see chart for details).     Patient's work-up with finding of hematuria no evidence of any kidney stone.  Patient with left-sided lumbar back pain that seems somewhat musculoskeletal in that it was made worse by bending over and movement.  Urinalysis otherwise not highly suggestive for urinary tract infection.  Sent for culture.  Patient will be treated with Keflex first dose given here and will be continued for the next 7 days.  Patient will follow-up with her primary care doctor to have urine rechecked.  Also culture is there to confirm whether infection is present or not.  Patient also be treated with hydrocodone for the back pain.  Patient was able to keep the Keflex dose down here.  Patient nontoxic no acute distress.  In addition it is possible symptoms and signs could be consistent with some early pyelonephritis.  Final Clinical Impressions(s) / ED Diagnoses   Final diagnoses:  Hematuria, unspecified type  Acute left-sided low back pain without sciatica    ED Discharge Orders        Ordered    cephALEXin (KEFLEX) 500 MG capsule  4 times daily     01/14/18 2028    ondansetron (ZOFRAN ODT) 4 MG disintegrating tablet  Every 8 hours PRN     01/14/18 2028    HYDROcodone-acetaminophen (NORCO/VICODIN) 5-325 MG tablet  Every 6 hours PRN     01/14/18 2028       Fredia Sorrow, MD 01/14/18 2034

## 2018-01-14 NOTE — ED Notes (Signed)
Patient transported to CT 

## 2018-01-14 NOTE — ED Notes (Signed)
Attempted to obtain blood x 1 without success

## 2018-01-14 NOTE — Discharge Instructions (Signed)
Take the antibiotic Keflex as directed.  Zofran provided for the nausea.  Also take the hydrocodone for the back pain.  Return for any new or worse symptoms return for fever return for persistent vomiting.  Otherwise make an appointment to follow-up with your doctor next week.  Work note provided to be out of work until Monday.

## 2018-01-16 LAB — URINE CULTURE

## 2018-05-03 ENCOUNTER — Emergency Department (HOSPITAL_BASED_OUTPATIENT_CLINIC_OR_DEPARTMENT_OTHER)
Admission: EM | Admit: 2018-05-03 | Discharge: 2018-05-03 | Disposition: A | Discharge status: Home / Self Care | Payer: Medicaid Other | Attending: Emergency Medicine | Admitting: Emergency Medicine

## 2018-05-03 ENCOUNTER — Other Ambulatory Visit: Payer: Self-pay

## 2018-05-03 ENCOUNTER — Emergency Department (HOSPITAL_BASED_OUTPATIENT_CLINIC_OR_DEPARTMENT_OTHER): Payer: Medicaid Other

## 2018-05-03 ENCOUNTER — Encounter (HOSPITAL_BASED_OUTPATIENT_CLINIC_OR_DEPARTMENT_OTHER): Payer: Self-pay | Admitting: *Deleted

## 2018-05-03 DIAGNOSIS — Z91040 Latex allergy status: Secondary | ICD-10-CM | POA: Diagnosis not present

## 2018-05-03 DIAGNOSIS — R531 Weakness: Secondary | ICD-10-CM | POA: Insufficient documentation

## 2018-05-03 DIAGNOSIS — K219 Gastro-esophageal reflux disease without esophagitis: Secondary | ICD-10-CM | POA: Diagnosis not present

## 2018-05-03 DIAGNOSIS — B349 Viral infection, unspecified: Secondary | ICD-10-CM | POA: Diagnosis not present

## 2018-05-03 DIAGNOSIS — R05 Cough: Secondary | ICD-10-CM | POA: Diagnosis not present

## 2018-05-03 DIAGNOSIS — R12 Heartburn: Secondary | ICD-10-CM | POA: Insufficient documentation

## 2018-05-03 DIAGNOSIS — R059 Cough, unspecified: Secondary | ICD-10-CM

## 2018-05-03 DIAGNOSIS — M79604 Pain in right leg: Secondary | ICD-10-CM

## 2018-05-03 DIAGNOSIS — R0602 Shortness of breath: Secondary | ICD-10-CM | POA: Diagnosis not present

## 2018-05-03 DIAGNOSIS — Z79899 Other long term (current) drug therapy: Secondary | ICD-10-CM | POA: Insufficient documentation

## 2018-05-03 LAB — PREGNANCY, URINE: Preg Test, Ur: NEGATIVE

## 2018-05-03 LAB — COMPREHENSIVE METABOLIC PANEL
ALT: 38 U/L (ref 0–44)
AST: 37 U/L (ref 15–41)
Albumin: 4 g/dL (ref 3.5–5.0)
Alkaline Phosphatase: 64 U/L (ref 38–126)
Anion gap: 9 (ref 5–15)
BUN: 15 mg/dL (ref 6–20)
CO2: 26 mmol/L (ref 22–32)
Calcium: 8.6 mg/dL — ABNORMAL LOW (ref 8.9–10.3)
Chloride: 101 mmol/L (ref 98–111)
Creatinine, Ser: 0.87 mg/dL (ref 0.44–1.00)
GFR calc Af Amer: >60 mL/min (ref >60)
GFR calc non Af Amer: >60 mL/min (ref >60)
Glucose, Bld: 85 mg/dL (ref 70–99)
Potassium: 3.8 mmol/L (ref 3.5–5.1)
Sodium: 136 mmol/L (ref 135–145)
Total Bilirubin: 0.4 mg/dL (ref 0.3–1.2)
Total Protein: 8.1 g/dL (ref 6.5–8.1)

## 2018-05-03 LAB — CBC WITH DIFFERENTIAL/PLATELET
Basophils Absolute: 0 10*3/uL (ref 0.0–0.1)
Basophils Relative: 1 %
Eosinophils Absolute: 0.1 10*3/uL (ref 0.0–0.7)
Eosinophils Relative: 2 %
HCT: 40.1 % (ref 36.0–46.0)
Hemoglobin: 13.7 g/dL (ref 12.0–15.0)
Lymphocytes Relative: 37 %
Lymphs Abs: 2.4 10*3/uL (ref 0.7–4.0)
MCH: 30.6 pg (ref 26.0–34.0)
MCHC: 34.2 g/dL (ref 30.0–36.0)
MCV: 89.5 fL (ref 78.0–100.0)
Monocytes Absolute: 0.5 10*3/uL (ref 0.1–1.0)
Monocytes Relative: 8 %
Neutro Abs: 3.3 10*3/uL (ref 1.7–7.7)
Neutrophils Relative %: 52 %
Platelets: 336 10*3/uL (ref 150–400)
RBC: 4.48 MIL/uL (ref 3.87–5.11)
RDW: 13.7 % (ref 11.5–15.5)
WBC: 6.4 10*3/uL (ref 4.0–10.5)

## 2018-05-03 LAB — LIPASE, BLOOD: Lipase: 19 U/L (ref 11–51)

## 2018-05-03 LAB — CBG MONITORING, ED: Glucose-Capillary: 86 mg/dL (ref 70–99)

## 2018-05-03 LAB — TROPONIN I: Troponin I: <0.03 ng/mL (ref <0.03)

## 2018-05-03 MED ORDER — PANTOPRAZOLE SODIUM 20 MG PO TBEC: 20.0000 mg | DELAYED_RELEASE_TABLET | Freq: Every day | ORAL | 0 refills | Status: DC

## 2018-05-03 MED ORDER — BENZONATATE 100 MG PO CAPS: 100.0000 mg | ORAL_CAPSULE | Freq: Three times a day (TID) | ORAL | 0 refills | Status: DC

## 2018-05-03 MED ORDER — GI COCKTAIL ~~LOC~~
30.0000 mL | Freq: Once | ORAL | Status: AC
  Administered 2018-05-03: 30 mL via ORAL
  Filled 2018-05-03: qty 30

## 2018-05-03 NOTE — ED Provider Notes (Signed)
[facility] DEPART[facility] Provider Note   CSN: [id number] Arrival date & time: 05/03/18  1401     History   Chief Complaint Chief Complaint  Patient presents with  . Leg Pain    HPI Paula Giles is a 32 y.o. female history of anxiety who presents with a 44-month history of right calf pain, 1 week history of heartburn, and a 3-day history of generalized weakness and feeling dehydrated.  Patient reports she has had intermittent pain to her right calf and right posterior thigh intermittently for the past few months.  She thinks she may have hurt it at work, but does not remember a specific incident.  It is worse when she moves it.  She is also had a one-week history of heartburn sensation and epigastric burning.  She reports this occasionally causes her to cough and have shortness of breath.  She has had this in the past.  She has been taking Zantac without relief.  She also reports feeling dehydrated for the past 3 days and just generally weak.  She denies any significant chest pain or pleuritic pain.  She denies any recent long trips, surgeries, history of blood clots, exogenous estrogen use, known cancer.  She has an occasional nausea, but no vomiting.  Normal bowel movements.  HPI  Past Medical History:  Diagnosis Date  . Anxiety   . BV (bacterial vaginosis)   . Irregular menstrual bleeding   . Mood disorder ([place])   . Seasonal allergies     There are no active problems to display for this patient.   Past Surgical History:  Procedure Laterality Date  . CESAREAN SECTION  2009  . WISDOM TOOTH EXTRACTION       OB History    Gravida  1   Para  1   Term  1   Preterm      AB      Living        SAB      TAB      Ectopic      Multiple      Live Births               Home Medications    Prior to Admission medications   Medication Sig Start Date End Date Taking? Authorizing Provider  hydrochlorothiazide (MICROZIDE) 12.5 MG capsule Take  12.5 mg by mouth daily.   Yes [provider]  acetaminophen (TYLENOL) 500 MG tablet Take 2 tablets (1,000 mg total) by mouth every 6 (six) hours as needed. Patient taking differently: Take 1,000 mg by mouth every 6 (six) hours as needed for mild pain or headache.  07/01/16   Gloriann Loan, PA-C  benzonatate (TESSALON) 100 MG capsule Take 1 capsule (100 mg total) by mouth every 8 (eight) hours. 05/03/18   Arris Meyn, Bea Graff, PA-C  clonazePAM (KLONOPIN) 0.5 MG tablet Take 1 tablet (0.5 mg total) by mouth 3 (three) times daily as needed for anxiety. 08/20/13   Harden Mo, MD  HYDROcodone-acetaminophen (NORCO/VICODIN) 5-325 MG tablet Take 1-2 tablets by mouth every 6 (six) hours as needed for moderate pain. 01/14/18   Fredia Sorrow, MD  ibuprofen (ADVIL,MOTRIN) 800 MG tablet Take 1 tablet (800 mg total) by mouth 3 (three) times daily. Patient not taking: Reported on 09/11/2016 07/01/16   Gloriann Loan, PA-C  ibuprofen (ADVIL,MOTRIN) 800 MG tablet Take 1 tablet (800 mg total) by mouth 3 (three) times daily with meals as needed for headache or moderate pain.  09/11/16   Chancy Milroy, MD  pantoprazole (PROTONIX) 20 MG tablet Take 1 tablet (20 mg total) by mouth daily. 05/03/18   Kearstin Learn, Bea Graff, PA-C  Pseudoephedrine HCl (SUDAFED PO) Take 2 tablets by mouth daily as needed (for congestion).    [provider]  traMADol (ULTRAM) 50 MG tablet Take 1 tablet (50 mg total) by mouth every 6 (six) hours as needed. Patient taking differently: Take 50 mg by mouth every 6 (six) hours as needed for moderate pain.  07/13/16   Veryl Speak, MD  vortioxetine HBr (TRINTELLIX) 5 MG TABS Take 5 mg by mouth daily.     [provider]    Family History History reviewed. No pertinent family history.  Social History Social History   Tobacco Use  . Smoking status: Never Smoker  . Smokeless tobacco: Never Used  Substance Use Topics  . Alcohol use: Yes    Comment: socially  . Drug use: No      Allergies   Latex   Review of Systems Review of Systems  Constitutional: Positive for fatigue. Negative for chills and fever.  HENT: Negative for facial swelling and sore throat.   Respiratory: Negative for shortness of breath.   Cardiovascular: Positive for chest pain ("heart burn").  Gastrointestinal: Positive for abdominal pain and nausea. Negative for blood in stool, constipation, diarrhea and vomiting.  Genitourinary: Negative for dysuria.  Musculoskeletal: Positive for myalgias. Negative for back pain.  Skin: Negative for rash and wound.  Neurological: Positive for weakness (generalized). Negative for headaches.  Psychiatric/Behavioral: The patient is not nervous/anxious.      Physical Exam Updated Vital Signs BP (!) 156/74 (BP Location: Right Arm)   Pulse 80   Temp 97.8 F (36.6 C) (Oral)   Resp 16   Ht 5\' 5"  (1.651 m)   Wt 113.4 kg   LMP 04/19/2018   SpO2 100%   BMI 41.60 kg/m   Physical Exam  Constitutional: She appears well-developed and well-nourished. No distress.  HENT:  Head: Normocephalic and atraumatic.  Mouth/Throat: Oropharynx is clear and moist. No oropharyngeal exudate.  Eyes: Pupils are equal, round, and reactive to light. Conjunctivae are normal. Right eye exhibits no discharge. Left eye exhibits no discharge. No scleral icterus.  Neck: Normal range of motion. Neck supple. No thyromegaly present.  Cardiovascular: Normal rate, regular rhythm, normal heart sounds and intact distal pulses. Exam reveals no gallop and no friction rub.  No murmur heard. Pulmonary/Chest: Effort normal and breath sounds normal. No stridor. No respiratory distress. She has no wheezes. She has no rales.  Abdominal: Soft. Bowel sounds are normal. She exhibits no distension. There is tenderness in the epigastric area. There is no rebound, no guarding and no CVA tenderness.  Musculoskeletal: She exhibits no edema.       Legs: Tenderness to the right calf and posterior  thigh, no significant edema noted, no erythema; no tenderness to the right knee or ankle joints; full range of motion  Lymphadenopathy:    She has no cervical adenopathy.  Neurological: She is alert. Coordination normal.  Skin: Skin is warm and dry. No rash noted. She is not diaphoretic. No pallor.  Psychiatric: She has a normal mood and affect.  Nursing note and vitals reviewed.    ED Treatments / Results  Labs (all labs ordered are listed, but only abnormal results are displayed) Labs Reviewed  COMPREHENSIVE METABOLIC PANEL - Abnormal; Notable for the following components:      Result Value  Calcium 8.6 (*)    All other components within normal limits  LIPASE, BLOOD  CBC WITH DIFFERENTIAL/PLATELET  TROPONIN I  PREGNANCY, URINE  CBG MONITORING, ED    EKG None  Radiology Dg Chest 2 View  Result Date: 05/03/2018 CLINICAL DATA:  heartburn x 1 week, generalized weakness x 3 days EXAM: CHEST - 2 VIEW COMPARISON:  08/28/2017 FINDINGS: Heart size is UPPER normal. There are no focal consolidations or pleural effusions. No pulmonary edema. IMPRESSION: No evidence for acute pulmonary abnormality. Electronically Signed   By: Nolon Nations M.D.   On: 05/03/2018 17:25   US Venous Img Lower Unilateral Right  Result Date: 05/03/2018 CLINICAL DATA:  Right calf pain for 4 months EXAM: RIGHT LOWER EXTREMITY VENOUS DOPPLER ULTRASOUND TECHNIQUE: Gray-scale sonography with graded compression, as well as color Doppler and duplex ultrasound were performed to evaluate the lower extremity deep venous systems from the level of the common femoral vein and including the common femoral, femoral, profunda femoral, popliteal and calf veins including the posterior tibial, peroneal and gastrocnemius veins when visible. The superficial great saphenous vein was also interrogated. Spectral Doppler was utilized to evaluate flow at rest and with distal augmentation maneuvers in the common femoral, femoral and  popliteal veins. COMPARISON:  None. FINDINGS: Contralateral Common Femoral Vein: Respiratory phasicity is normal and symmetric with the symptomatic side. No evidence of thrombus. Normal compressibility. Common Femoral Vein: No evidence of thrombus. Normal compressibility, respiratory phasicity and response to augmentation. Saphenofemoral Junction: No evidence of thrombus. Normal compressibility and flow on color Doppler imaging. Profunda Femoral Vein: No evidence of thrombus. Normal compressibility and flow on color Doppler imaging. Femoral Vein: No evidence of thrombus. Normal compressibility, respiratory phasicity and response to augmentation. Popliteal Vein: No evidence of thrombus. Normal compressibility, respiratory phasicity and response to augmentation. Calf Veins: No evidence of thrombus. Normal compressibility and flow on color Doppler imaging. Superficial Great Saphenous Vein: No evidence of thrombus. Normal compressibility. Venous Reflux:  None. Other Findings:  None. IMPRESSION: No evidence of deep venous thrombosis. Electronically Signed   By: Jerilynn Mages.  Shick M.D.   On: 05/03/2018 17:33    Procedures Procedures (including critical care time)  Medications Ordered in ED Medications  gi cocktail (Maalox,Lidocaine,Donnatal) (30 mLs Oral Given 05/03/18 1634)     Initial Impression / Assessment and Plan / ED Course  I have reviewed the triage vital signs and the nursing notes.  Pertinent labs & imaging results that were available during my care of the patient were reviewed by me and considered in my medical decision making (see chart for details).     Patient presenting with multiple complaints.  Most prominently, her right calf pain for the past 4 months.  DVT ultrasound is negative.  Suspect this is a tightness or strain of her leg.  We will treat supportively with NSAIDs, stretching and follow-up to sports medicine as needed.  I also advised better supportive shoes.  Patient's GERD symptoms  improved with GI cocktail in the ED.  Labs are unremarkable.  Patient also with nasal congestion and cough as well as generalized fatigue.  Suspect viral syndrome and will treat cough with Tessalon.  Chest x-ray is negative.  Urine pregnancy negative.  Patient to follow-up and establish care with PCP.  Will add Protonix to Zantac.  Patient also given guidance on food choices.  Return precautions discussed.  Patient understands and agrees with plan.  Patient vitals stable throughout ED course and discharged in satisfactory condition.  Final Clinical  Impressions(s) / ED Diagnoses   Final diagnoses:  Right leg pain  Gastroesophageal reflux disease, esophagitis presence not specified  Cough  Viral syndrome    ED Discharge Orders         Ordered    pantoprazole (PROTONIX) 20 MG tablet  Daily     05/03/18 1854    benzonatate (TESSALON) 100 MG capsule  Every 8 hours     05/03/18 1854           Frederica Kuster, PA-C 05/03/18 Einar Crow    Virgel Manifold, MD 05/03/18 2321

## 2018-05-03 NOTE — ED Triage Notes (Addendum)
Pt  Has multiple complaints and no PMD c/o right calf pain x 4 months, c/o heartburn x 1 week, c/o generalized weakness x 3 days

## 2018-05-03 NOTE — Discharge Instructions (Signed)
Begin taking Protonix daily as prescribed.  You can continue taking Zantac with this medication.  Take Tessalon every 8 hours as needed for cough.  Make sure to drink plenty of fluids.  Take ibuprofen as prescribed over-the-counter, as needed for your leg pain.  Use ice and heat alternating 20 minutes on, 20 minutes off.  Stretch your leg as tolerated.  Make sure to wear supportive shoes.  Please follow-up with Dr. Barbaraann Barthel for further evaluation and treatment of your pain.  Please follow-up with a primary care provider by calling number circled on your discharge report.  Please return the emergency department if you develop any new or worsening symptoms.

## 2018-05-03 NOTE — ED Notes (Signed)
Patient transported to Ultrasound 

## 2019-04-05 IMAGING — US US EXTREM LOW VENOUS*R*
1 series · 13 of 24 positions shown · non-contrast
Comparison: None.

CLINICAL DATA: Right calf pain for 4 months



[Series 1: us extrem low venous*right* · 0.10mm/px · 13 of 33 slices shown]
[im 1/33]
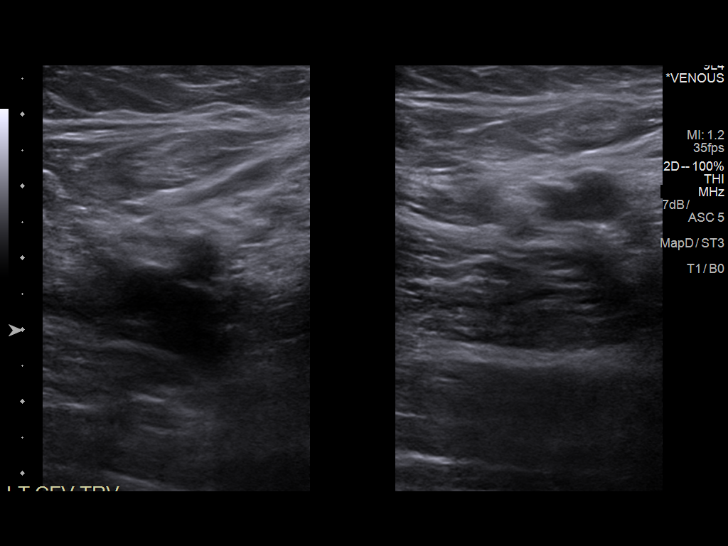
[im 3/33]
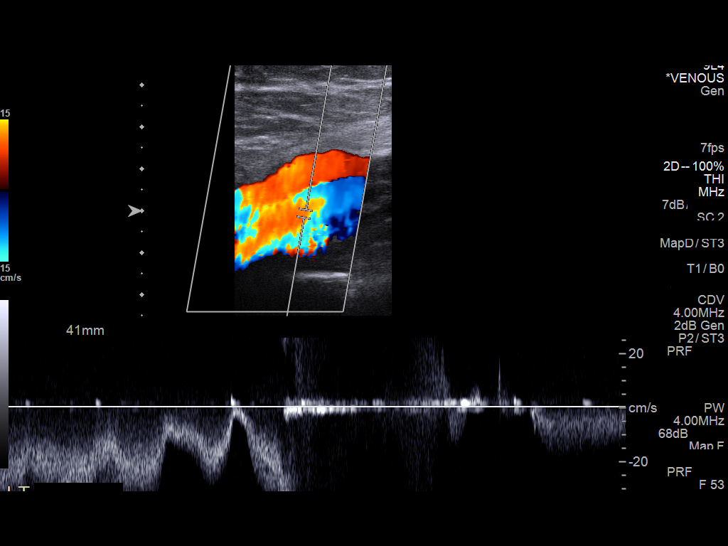
[im 6/33]
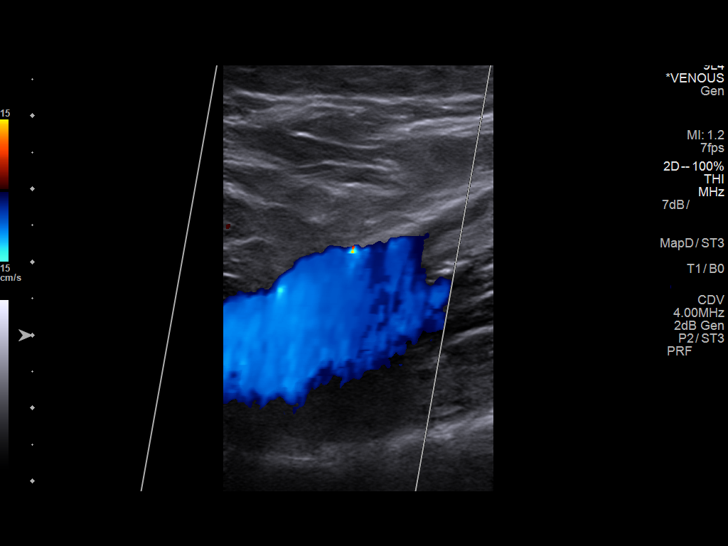
[im 9/33]
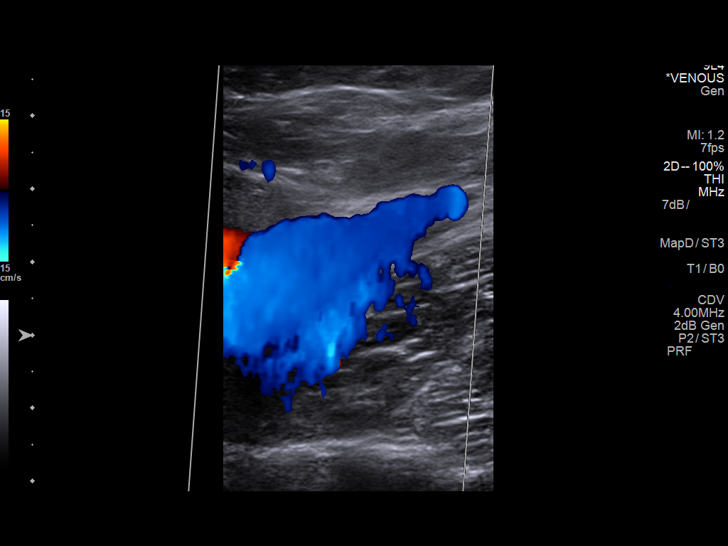
[im 12/33]
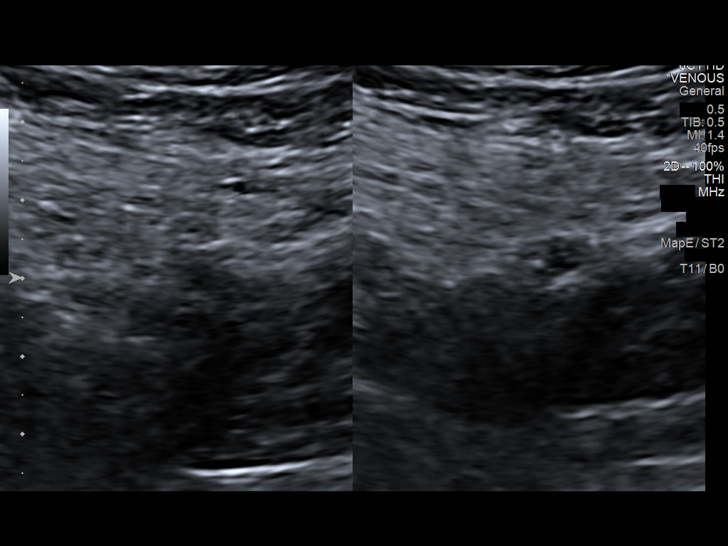
[im 14/33]
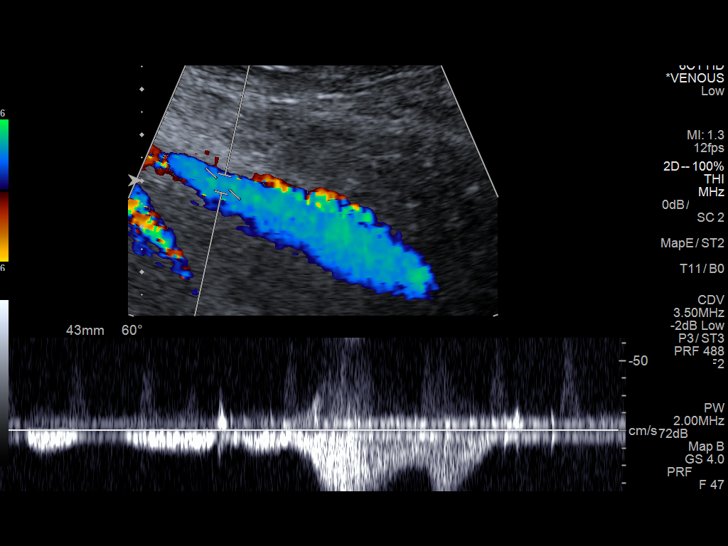
[im 17/33]
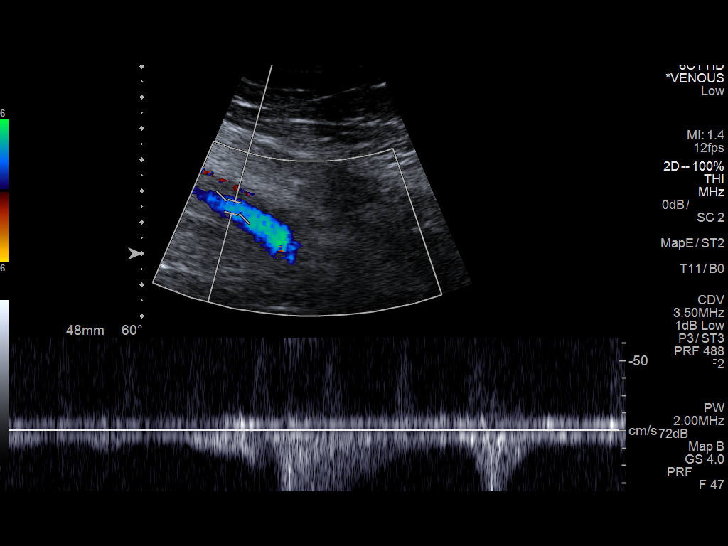
[im 19/33]
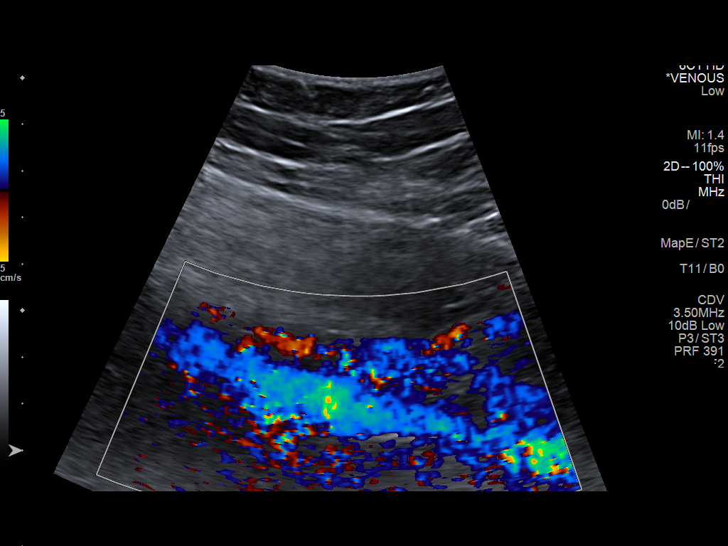
[im 21/33]
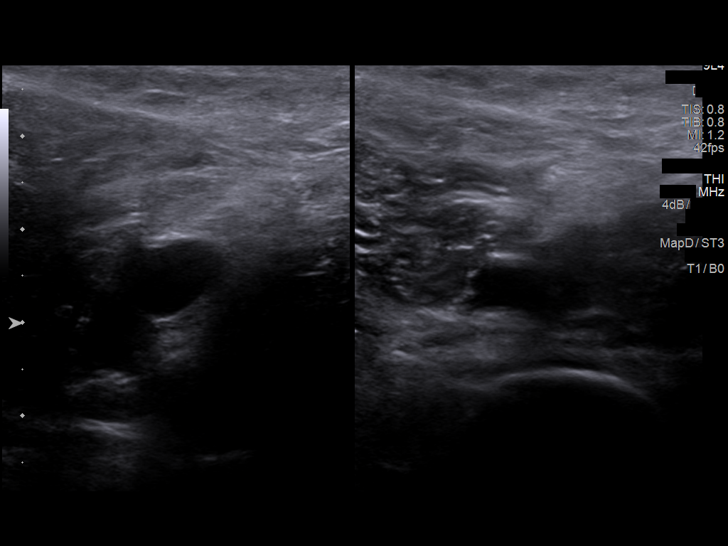
[im 24/33]
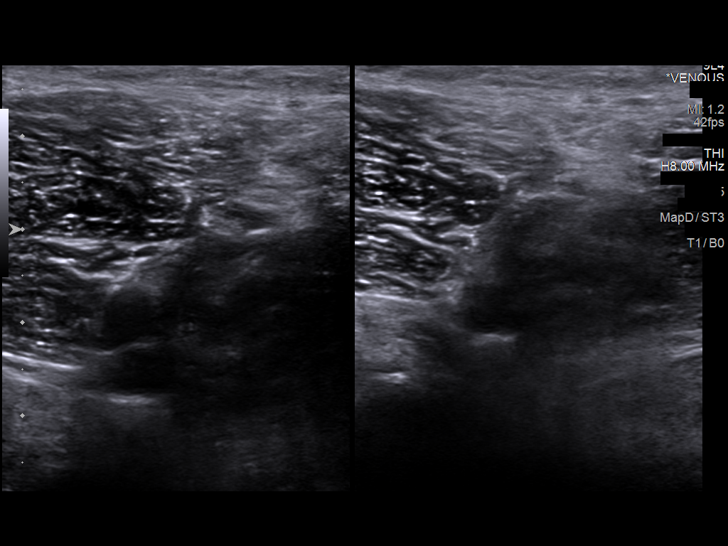
[im 27/33]
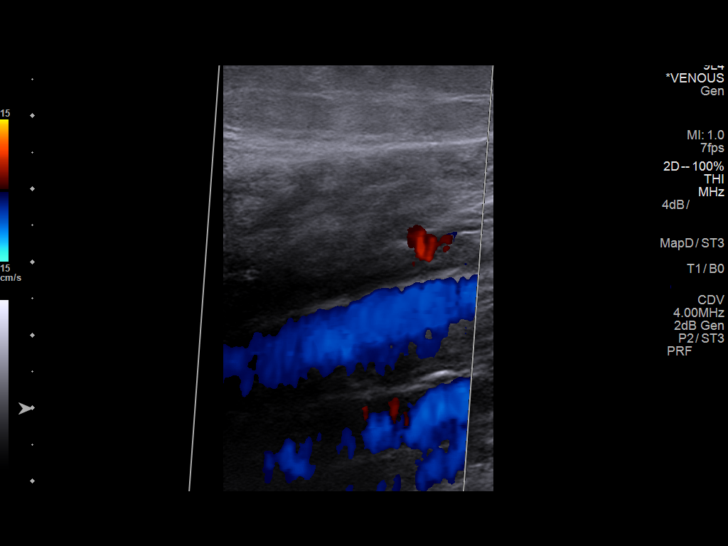
[im 30/33]
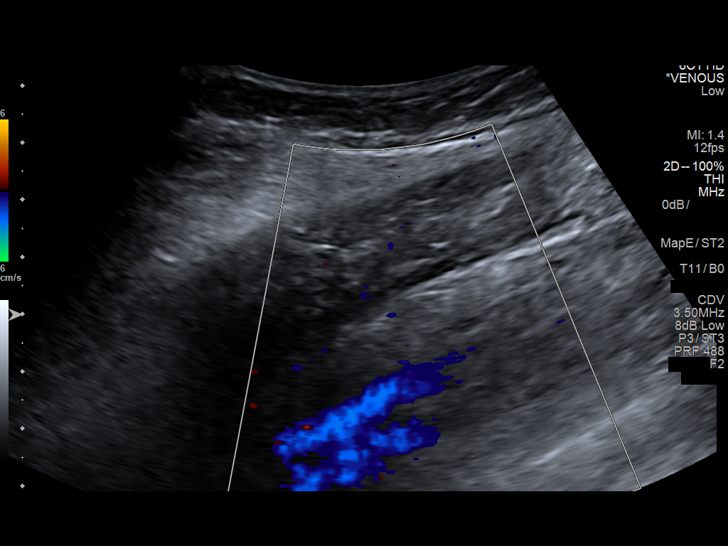
[im 33/33]
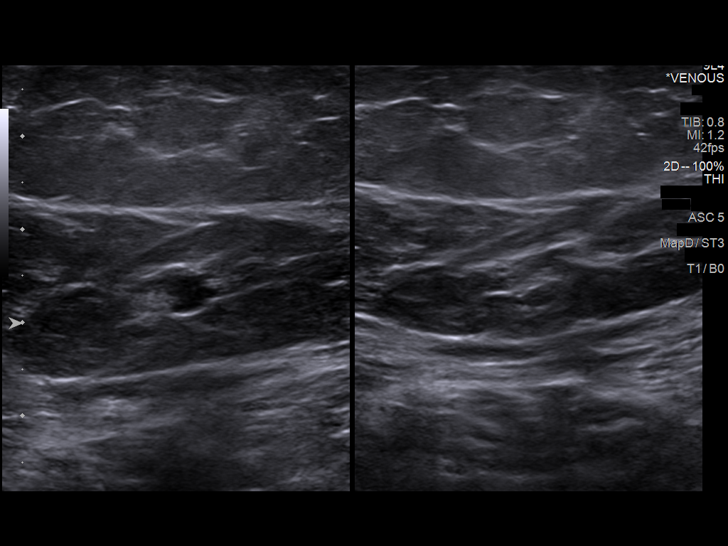

[13 of 24 positions shown; findings below may reference images not displayed]

FINDINGS: Contralateral Common Femoral Vein: Respiratory phasicity is normal
and symmetric with the symptomatic side. No evidence of thrombus.
Normal compressibility.

Common Femoral Vein: No evidence of thrombus. Normal
compressibility, respiratory phasicity and response to augmentation.

Saphenofemoral Junction: No evidence of thrombus. Normal
compressibility and flow on color Doppler imaging.

Profunda Femoral Vein: No evidence of thrombus. Normal
compressibility and flow on color Doppler imaging.

Femoral Vein: No evidence of thrombus. Normal compressibility,
respiratory phasicity and response to augmentation.

Popliteal Vein: No evidence of thrombus. Normal compressibility,
respiratory phasicity and response to augmentation.

Calf Veins: No evidence of thrombus. Normal compressibility and flow
on color Doppler imaging.

Superficial Great Saphenous Vein: No evidence of thrombus. Normal
compressibility.

Venous Reflux:  None.

Other Findings:  None.
IMPRESSION: No evidence of deep venous thrombosis.

## 2019-05-23 ENCOUNTER — Emergency Department (HOSPITAL_BASED_OUTPATIENT_CLINIC_OR_DEPARTMENT_OTHER)
Admission: EM | Admit: 2019-05-23 | Discharge: 2019-05-23 | Disposition: A | Discharge status: Home / Self Care | Payer: Medicaid Other | Attending: Emergency Medicine | Admitting: Emergency Medicine

## 2019-05-23 ENCOUNTER — Emergency Department (HOSPITAL_BASED_OUTPATIENT_CLINIC_OR_DEPARTMENT_OTHER): Payer: Medicaid Other

## 2019-05-23 ENCOUNTER — Encounter (HOSPITAL_BASED_OUTPATIENT_CLINIC_OR_DEPARTMENT_OTHER): Payer: Self-pay | Admitting: Adult Health

## 2019-05-23 ENCOUNTER — Other Ambulatory Visit: Payer: Self-pay

## 2019-05-23 DIAGNOSIS — M7918 Myalgia, other site: Secondary | ICD-10-CM | POA: Diagnosis present

## 2019-05-23 DIAGNOSIS — J101 Influenza due to other identified influenza virus with other respiratory manifestations: Secondary | ICD-10-CM | POA: Diagnosis not present

## 2019-05-23 DIAGNOSIS — N939 Abnormal uterine and vaginal bleeding, unspecified: Secondary | ICD-10-CM

## 2019-05-23 DIAGNOSIS — Z91040 Latex allergy status: Secondary | ICD-10-CM | POA: Diagnosis not present

## 2019-05-23 DIAGNOSIS — J111 Influenza due to unidentified influenza virus with other respiratory manifestations: Secondary | ICD-10-CM

## 2019-05-23 DIAGNOSIS — Z79899 Other long term (current) drug therapy: Secondary | ICD-10-CM | POA: Diagnosis not present

## 2019-05-23 DIAGNOSIS — Z20828 Contact with and (suspected) exposure to other viral communicable diseases: Secondary | ICD-10-CM

## 2019-05-23 DIAGNOSIS — Z20822 Contact with and (suspected) exposure to covid-19: Secondary | ICD-10-CM

## 2019-05-23 LAB — COMPREHENSIVE METABOLIC PANEL
ALT: 47 U/L — ABNORMAL HIGH (ref 0–44)
AST: 43 U/L — ABNORMAL HIGH (ref 15–41)
Albumin: 3.9 g/dL (ref 3.5–5.0)
Alkaline Phosphatase: 52 U/L (ref 38–126)
Anion gap: 12 (ref 5–15)
BUN: 16 mg/dL (ref 6–20)
CO2: 26 mmol/L (ref 22–32)
Calcium: 9 mg/dL (ref 8.9–10.3)
Chloride: 99 mmol/L (ref 98–111)
Creatinine, Ser: 0.96 mg/dL (ref 0.44–1.00)
GFR calc Af Amer: >60 mL/min (ref >60)
GFR calc non Af Amer: >60 mL/min (ref >60)
Glucose, Bld: 105 mg/dL — ABNORMAL HIGH (ref 70–99)
Potassium: 3.6 mmol/L (ref 3.5–5.1)
Sodium: 137 mmol/L (ref 135–145)
Total Bilirubin: 0.4 mg/dL (ref 0.3–1.2)
Total Protein: 7.3 g/dL (ref 6.5–8.1)

## 2019-05-23 LAB — CBC WITH DIFFERENTIAL/PLATELET
Abs Immature Granulocytes: 0.03 10*3/uL (ref 0.00–0.07)
Basophils Absolute: 0 10*3/uL (ref 0.0–0.1)
Basophils Relative: 1 %
Eosinophils Absolute: 0.4 10*3/uL (ref 0.0–0.5)
Eosinophils Relative: 5 %
HCT: 27.1 % — ABNORMAL LOW (ref 36.0–46.0)
Hemoglobin: 8.4 g/dL — ABNORMAL LOW (ref 12.0–15.0)
Immature Granulocytes: 0 %
Lymphocytes Relative: 34 %
Lymphs Abs: 2.7 10*3/uL (ref 0.7–4.0)
MCH: 27 pg (ref 26.0–34.0)
MCHC: 31 g/dL (ref 30.0–36.0)
MCV: 87.1 fL (ref 80.0–100.0)
Monocytes Absolute: 0.8 10*3/uL (ref 0.1–1.0)
Monocytes Relative: 9 %
Neutro Abs: 4.1 10*3/uL (ref 1.7–7.7)
Neutrophils Relative %: 51 %
Platelets: 383 10*3/uL (ref 150–400)
RBC: 3.11 MIL/uL — ABNORMAL LOW (ref 3.87–5.11)
RDW: 20.7 % — ABNORMAL HIGH (ref 11.5–15.5)
WBC: 8 10*3/uL (ref 4.0–10.5)
nRBC: 0 % (ref 0.0–0.2)

## 2019-05-23 LAB — URINALYSIS, ROUTINE W REFLEX MICROSCOPIC

## 2019-05-23 LAB — URINALYSIS, MICROSCOPIC (REFLEX): RBC / HPF: >50 RBC/hpf (ref 0–5)

## 2019-05-23 LAB — PREGNANCY, URINE: Preg Test, Ur: NEGATIVE

## 2019-05-23 MED ORDER — PROCHLORPERAZINE EDISYLATE 10 MG/2ML IJ SOLN
10.0000 mg | Freq: Once | INTRAMUSCULAR | Status: AC
  Administered 2019-05-23: 10 mg via INTRAVENOUS
  Filled 2019-05-23: qty 2

## 2019-05-23 MED ORDER — MEGESTROL ACETATE 40 MG PO TABS: 40.0000 mg | ORAL_TABLET | Freq: Two times a day (BID) | ORAL | 0 refills | Status: DC

## 2019-05-23 MED ORDER — SODIUM CHLORIDE 0.9 % IV BOLUS
1000.0000 mL | Freq: Once | INTRAVENOUS | Status: AC
  Administered 2019-05-23: 11:00:00 1000 mL via INTRAVENOUS

## 2019-05-23 MED ORDER — DIPHENHYDRAMINE HCL 50 MG/ML IJ SOLN
25.0000 mg | Freq: Once | INTRAMUSCULAR | Status: AC
  Administered 2019-05-23: 25 mg via INTRAVENOUS
  Filled 2019-05-23: qty 1

## 2019-05-23 MED FILL — MEGESTROL 40 MG TABLET: 40 | 15 days supply | Qty: 30 | Fill #0

## 2019-05-23 NOTE — Discharge Instructions (Signed)
Take the Megace to help with the bleeding until you follow-up with your OB/GYN. Take Tylenol as needed for body aches and fever. We will contact you with the results of your Covid test. Return to the ED for worsening symptoms, chest pain, shortness of breath, leg swelling, unable to keep anything down.

## 2019-05-23 NOTE — ED Triage Notes (Addendum)
PResents with generalized body aches that began one week ago today, associated with fever of 101.0 off and on. SHe states she feels hot and has headaches but the body aches are severe. She was taken off klonopin1mg ,  2 weeks ago-without a taper. She took klonopin for many years daily.  She has been taking therflu and ibuprofen for fever. She denies cough, abdominal pain and nausea but reports loss of appetite. She also complains of 2 months of vaginal bleeding, she has been seen here for this recently but is still bleeding. SHe has not followed up.

## 2019-05-23 NOTE — ED Provider Notes (Signed)
[facility] EMERGENCY DEPARTMENT Provider Note   CSN: [id number] Arrival date & time: 05/23/19  [id number]     History   Chief Complaint Chief Complaint  Patient presents with   Generalized Body Aches    HPI Paula Giles is a 33 y.o. female with a past medical history of irregular menstrual bleeding, anxiety who presents to ED for evaluation of generalized body aches, intermittent fever, headaches, diarrhea.  She states that symptoms began 2 weeks ago and have slowly progressed.  No sick contacts with similar symptoms.  She has tried NyQuil, TheraFlu, ibuprofen with only mild improvement in her symptoms.  She denies abdominal pain, chest pain, abdominal pain, vomiting, shortness of breath, back pain or cough.  She has not been tested for COVID-19 and has no known exposures.  She does note vaginal bleeding for the past 2 months but states that this happens to her once a year due to her "small fibroids."     HPI  Past Medical History:  Diagnosis Date   Anxiety    BV (bacterial vaginosis)    Irregular menstrual bleeding    Mood disorder (HCC)    Seasonal allergies     There are no active problems to display for this patient.   Past Surgical History:  Procedure Laterality Date   CESAREAN SECTION  2009   WISDOM TOOTH EXTRACTION       OB History    Gravida  1   Para  1   Term  1   Preterm      AB      Living        SAB      TAB      Ectopic      Multiple      Live Births               Home Medications    Prior to Admission medications   Medication Sig Start Date End Date Taking? Authorizing Provider  acetaminophen (TYLENOL) 500 MG tablet Take 2 tablets (1,000 mg total) by mouth every 6 (six) hours as needed. Patient taking differently: Take 1,000 mg by mouth every 6 (six) hours as needed for mild pain or headache.  07/01/16   Gloriann Loan, PA-C  benzonatate (TESSALON) 100 MG capsule Take 1 capsule (100 mg total) by mouth every 8  (eight) hours. 05/03/18   Law, Bea Graff, PA-C  clonazePAM (KLONOPIN) 0.5 MG tablet Take 1 tablet (0.5 mg total) by mouth 3 (three) times daily as needed for anxiety. 08/20/13   Harden Mo, MD  hydrochlorothiazide (MICROZIDE) 12.5 MG capsule Take 12.5 mg by mouth daily.    [provider]  HYDROcodone-acetaminophen (NORCO/VICODIN) 5-325 MG tablet Take 1-2 tablets by mouth every 6 (six) hours as needed for moderate pain. 01/14/18   Fredia Sorrow, MD  ibuprofen (ADVIL,MOTRIN) 800 MG tablet Take 1 tablet (800 mg total) by mouth 3 (three) times daily. Patient not taking: Reported on 09/11/2016 07/01/16   Gloriann Loan, PA-C  ibuprofen (ADVIL,MOTRIN) 800 MG tablet Take 1 tablet (800 mg total) by mouth 3 (three) times daily with meals as needed for headache or moderate pain. 09/11/16   Chancy Milroy, MD  megestrol (MEGACE) 40 MG tablet Take 1 tablet (40 mg total) by mouth 2 (two) times daily. 05/23/19   Liseth Wann, PA-C  pantoprazole (PROTONIX) 20 MG tablet Take 1 tablet (20 mg total) by mouth daily. 05/03/18   Law, Bea Graff, PA-C  Pseudoephedrine HCl (  SUDAFED PO) Take 2 tablets by mouth daily as needed (for congestion).    [provider]  traMADol (ULTRAM) 50 MG tablet Take 1 tablet (50 mg total) by mouth every 6 (six) hours as needed. Patient taking differently: Take 50 mg by mouth every 6 (six) hours as needed for moderate pain.  07/13/16   Veryl Speak, MD  vortioxetine HBr (TRINTELLIX) 5 MG TABS Take 5 mg by mouth daily.     [provider]    Family History History reviewed. No pertinent family history.  Social History Social History   Tobacco Use   Smoking status: Never Smoker   Smokeless tobacco: Never Used  Substance Use Topics   Alcohol use: Yes    Comment: socially   Drug use: No     Allergies   Latex   Review of Systems Review of Systems  Constitutional: Positive for fever. Negative for appetite change and chills.  HENT: Negative  for ear pain, rhinorrhea, sneezing and sore throat.   Eyes: Negative for photophobia and visual disturbance.  Respiratory: Negative for cough, chest tightness, shortness of breath and wheezing.   Cardiovascular: Negative for chest pain and palpitations.  Gastrointestinal: Negative for abdominal pain, blood in stool, constipation, diarrhea, nausea and vomiting.  Genitourinary: Negative for dysuria, hematuria and urgency.  Musculoskeletal: Positive for myalgias.  Skin: Negative for rash.  Neurological: Positive for headaches. Negative for dizziness, weakness and light-headedness.     Physical Exam Updated Vital Signs BP 138/87 (BP Location: Right Arm)    Pulse 95    Temp 98.8 F (37.1 C) (Oral)    Resp 18    Ht 5\' 4"  (1.626 m)    Wt 122.5 kg    LMP 03/16/2019 (Exact Date)    SpO2 99%    BMI 46.35 kg/m   Physical Exam Vitals signs and nursing note reviewed.  Constitutional:      General: She is not in acute distress.    Appearance: She is well-developed.  HENT:     Head: Normocephalic and atraumatic.     Nose: Nose normal.  Eyes:     General: No scleral icterus.       Left eye: No discharge.     Conjunctiva/sclera: Conjunctivae normal.  Neck:     Musculoskeletal: Normal range of motion and neck supple.  Cardiovascular:     Rate and Rhythm: Normal rate and regular rhythm.     Heart sounds: Normal heart sounds. No murmur. No friction rub. No gallop.   Pulmonary:     Effort: Pulmonary effort is normal. No respiratory distress.     Breath sounds: Normal breath sounds.  Abdominal:     General: Bowel sounds are normal. There is no distension.     Palpations: Abdomen is soft.     Tenderness: There is no abdominal tenderness. There is no guarding.  Musculoskeletal: Normal range of motion.  Skin:    General: Skin is warm and dry.     Findings: No rash.  Neurological:     General: No focal deficit present.     Mental Status: She is alert and oriented to person, place, and time.      Cranial Nerves: No cranial nerve deficit.     Sensory: No sensory deficit.     Motor: No weakness or abnormal muscle tone.     Coordination: Coordination normal.      ED Treatments / Results  Labs (all labs ordered are listed, but only abnormal results are  displayed) Labs Reviewed  URINALYSIS, ROUTINE W REFLEX MICROSCOPIC - Abnormal; Notable for the following components:      Result Value   Color, Urine RED (*)    APPearance TURBID (*)    Glucose, UA   (*)    Value: TEST NOT REPORTED DUE TO COLOR INTERFERENCE OF URINE PIGMENT   Hgb urine dipstick   (*)    Value: TEST NOT REPORTED DUE TO COLOR INTERFERENCE OF URINE PIGMENT   Bilirubin Urine   (*)    Value: TEST NOT REPORTED DUE TO COLOR INTERFERENCE OF URINE PIGMENT   Ketones, ur   (*)    Value: TEST NOT REPORTED DUE TO COLOR INTERFERENCE OF URINE PIGMENT   Protein, ur   (*)    Value: TEST NOT REPORTED DUE TO COLOR INTERFERENCE OF URINE PIGMENT   Nitrite   (*)    Value: TEST NOT REPORTED DUE TO COLOR INTERFERENCE OF URINE PIGMENT   Leukocytes,Ua   (*)    Value: TEST NOT REPORTED DUE TO COLOR INTERFERENCE OF URINE PIGMENT   All other components within normal limits  URINALYSIS, MICROSCOPIC (REFLEX) - Abnormal; Notable for the following components:   Bacteria, UA MANY (*)    All other components within normal limits  CBC WITH DIFFERENTIAL/PLATELET - Abnormal; Notable for the following components:   RBC 3.11 (*)    Hemoglobin 8.4 (*)    HCT 27.1 (*)    RDW 20.7 (*)    All other components within normal limits  COMPREHENSIVE METABOLIC PANEL - Abnormal; Notable for the following components:   Glucose, Bld 105 (*)    AST 43 (*)    ALT 47 (*)    All other components within normal limits  NOVEL CORONAVIRUS, NAA (HOSP ORDER, SEND-OUT TO REF LAB; TAT 18-24 HRS)  PREGNANCY, URINE    EKG None  Radiology Dg Chest Portable 1 View  Result Date: 05/23/2019 CLINICAL DATA:  PResents with generalized body aches that began one  week ago today, associated with fever of 101.0 off and on. SHe states she feels hot and has headaches but the body aches are severe. EXAM: PORTABLE CHEST 1 VIEW COMPARISON:  Chest radiograph 05/03/2018 FINDINGS: Stable cardiomediastinal contours. Heart size is upper limits of normal. The lungs are clear. No pneumothorax or large pleural effusion. The visualized skeletal structures are unremarkable. IMPRESSION: No evidence of active disease in the chest. Electronically Signed   By: Audie Pinto M.D.   On: 05/23/2019 10:59    Procedures Procedures (including critical care time)  Medications Ordered in ED Medications  sodium chloride 0.9 % bolus 1,000 mL (1,000 mLs Intravenous New Bag/Given 05/23/19 1114)  prochlorperazine (COMPAZINE) injection 10 mg (10 mg Intravenous Given 05/23/19 1119)  diphenhydrAMINE (BENADRYL) injection 25 mg (25 mg Intravenous Given 05/23/19 1115)     Initial Impression / Assessment and Plan / ED Course  I have reviewed the triage vital signs and the nursing notes.  Pertinent labs & imaging results that were available during my care of the patient were reviewed by me and considered in my medical decision making (see chart for details).        33 year old female presents to ED for multiple complaints.  She complains of generalized body aches, fever, headache, loss of appetite for the past 2 weeks.  Symptoms are gradually worsening.  She also complains of 10-month history of intermittent vaginal bleeding.  She was told that she had fibroids in the past but they are too small to "  do anything about."  She has not followed up with her OB/GYN since this started but was told that she will have this for 2 months every year due to her fibroids.  Denies any abdominal pain.  On my exam patient is overall well-appearing.  No deficits neurological exam noted.  Abdomen is soft, nontender nondistended.  Lab work significant for hemoglobin of 8.4.  Mild elevation in LFTs.  Covid test  is pending.  Pregnancy test is negative.  Patient declined pelvic exam.  Spoke to Dr. Kennon Rounds of OB/GYN who recommends Megace 40 mg twice a day to help with bleeding and follow-up with OB/GYN.  Patient given migraine cocktail here, IV fluids with improvement in her headache but states that she would like to go home and feels "weird."  This could be a reaction to the Compazine.  States that her symptoms have improved.  We will have her return for any worsening symptoms.  Patient is hemodynamically stable, in NAD, and able to ambulate in the ED. Evaluation does not show pathology that would require ongoing emergent intervention or inpatient treatment. I explained the diagnosis to the patient. Pain has been managed and has no complaints prior to discharge. Patient is comfortable with above plan and is stable for discharge at this time. All questions were answered prior to disposition. Strict return precautions for returning to the ED were discussed. Encouraged follow up with PCP.   An After Visit Summary was printed and given to the patient.   Portions of this note were generated with [employer]. Dictation errors may occur despite best attempts at proofreading.   Final Clinical Impressions(s) / ED Diagnoses   Final diagnoses:  Influenza-like illness  Abnormal uterine bleeding  COVID-19 virus test result unknown    ED Discharge Orders         Ordered    megestrol (MEGACE) 40 MG tablet  2 times daily     05/23/19 1205           Delia Heady, PA-C 05/23/19 1208    Charlesetta Shanks, MD 05/25/19 [phone]

## 2019-05-24 LAB — NOVEL CORONAVIRUS, NAA (HOSP ORDER, SEND-OUT TO REF LAB; TAT 18-24 HRS): SARS-CoV-2, NAA: NOT DETECTED

## 2019-08-19 IMAGING — CR DG CHEST 2V
2 series · 2 of 2 positions shown · non-contrast
Comparison: 08/28/2017

CLINICAL DATA: heartburn x 1 week, generalized weakness x 3 days

EXAM:
CHEST - 2 VIEW

[w chest pa]
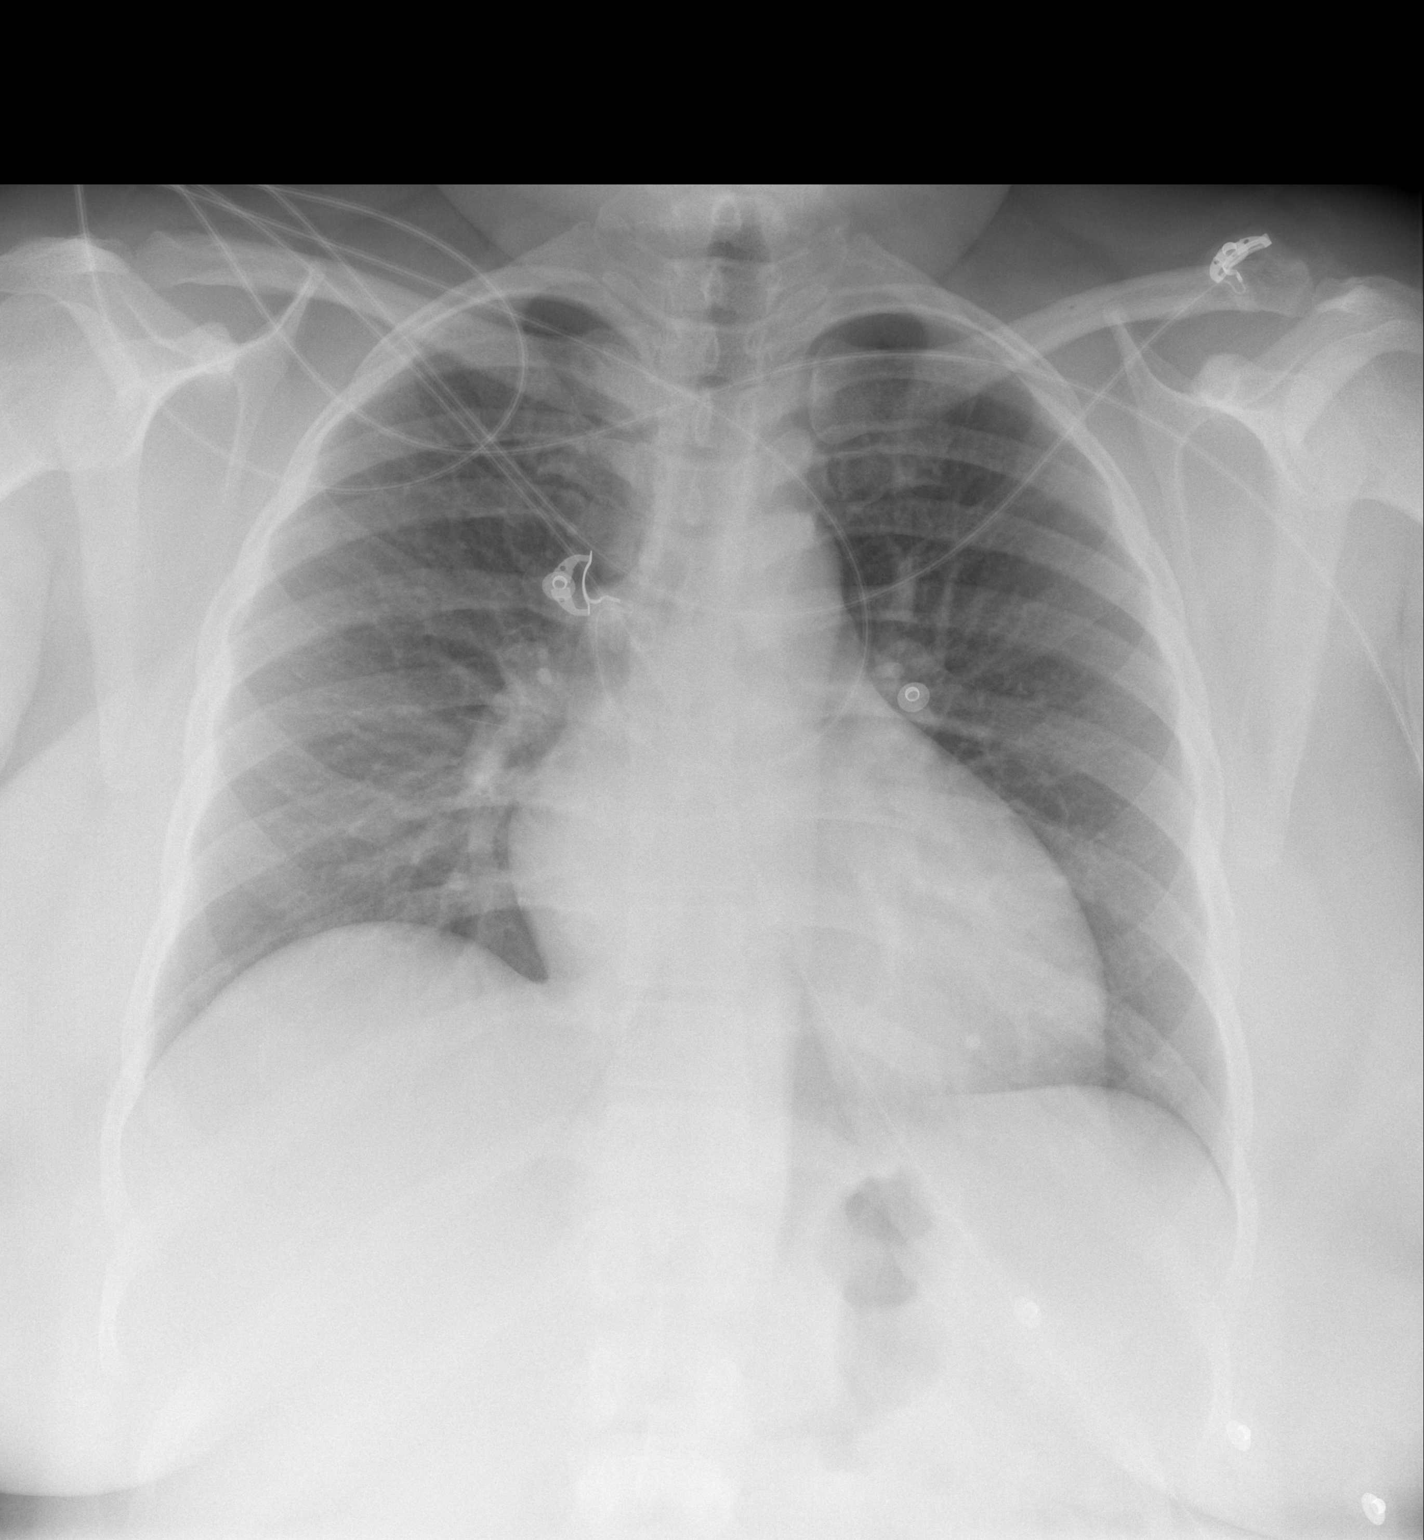

[w chest lat]
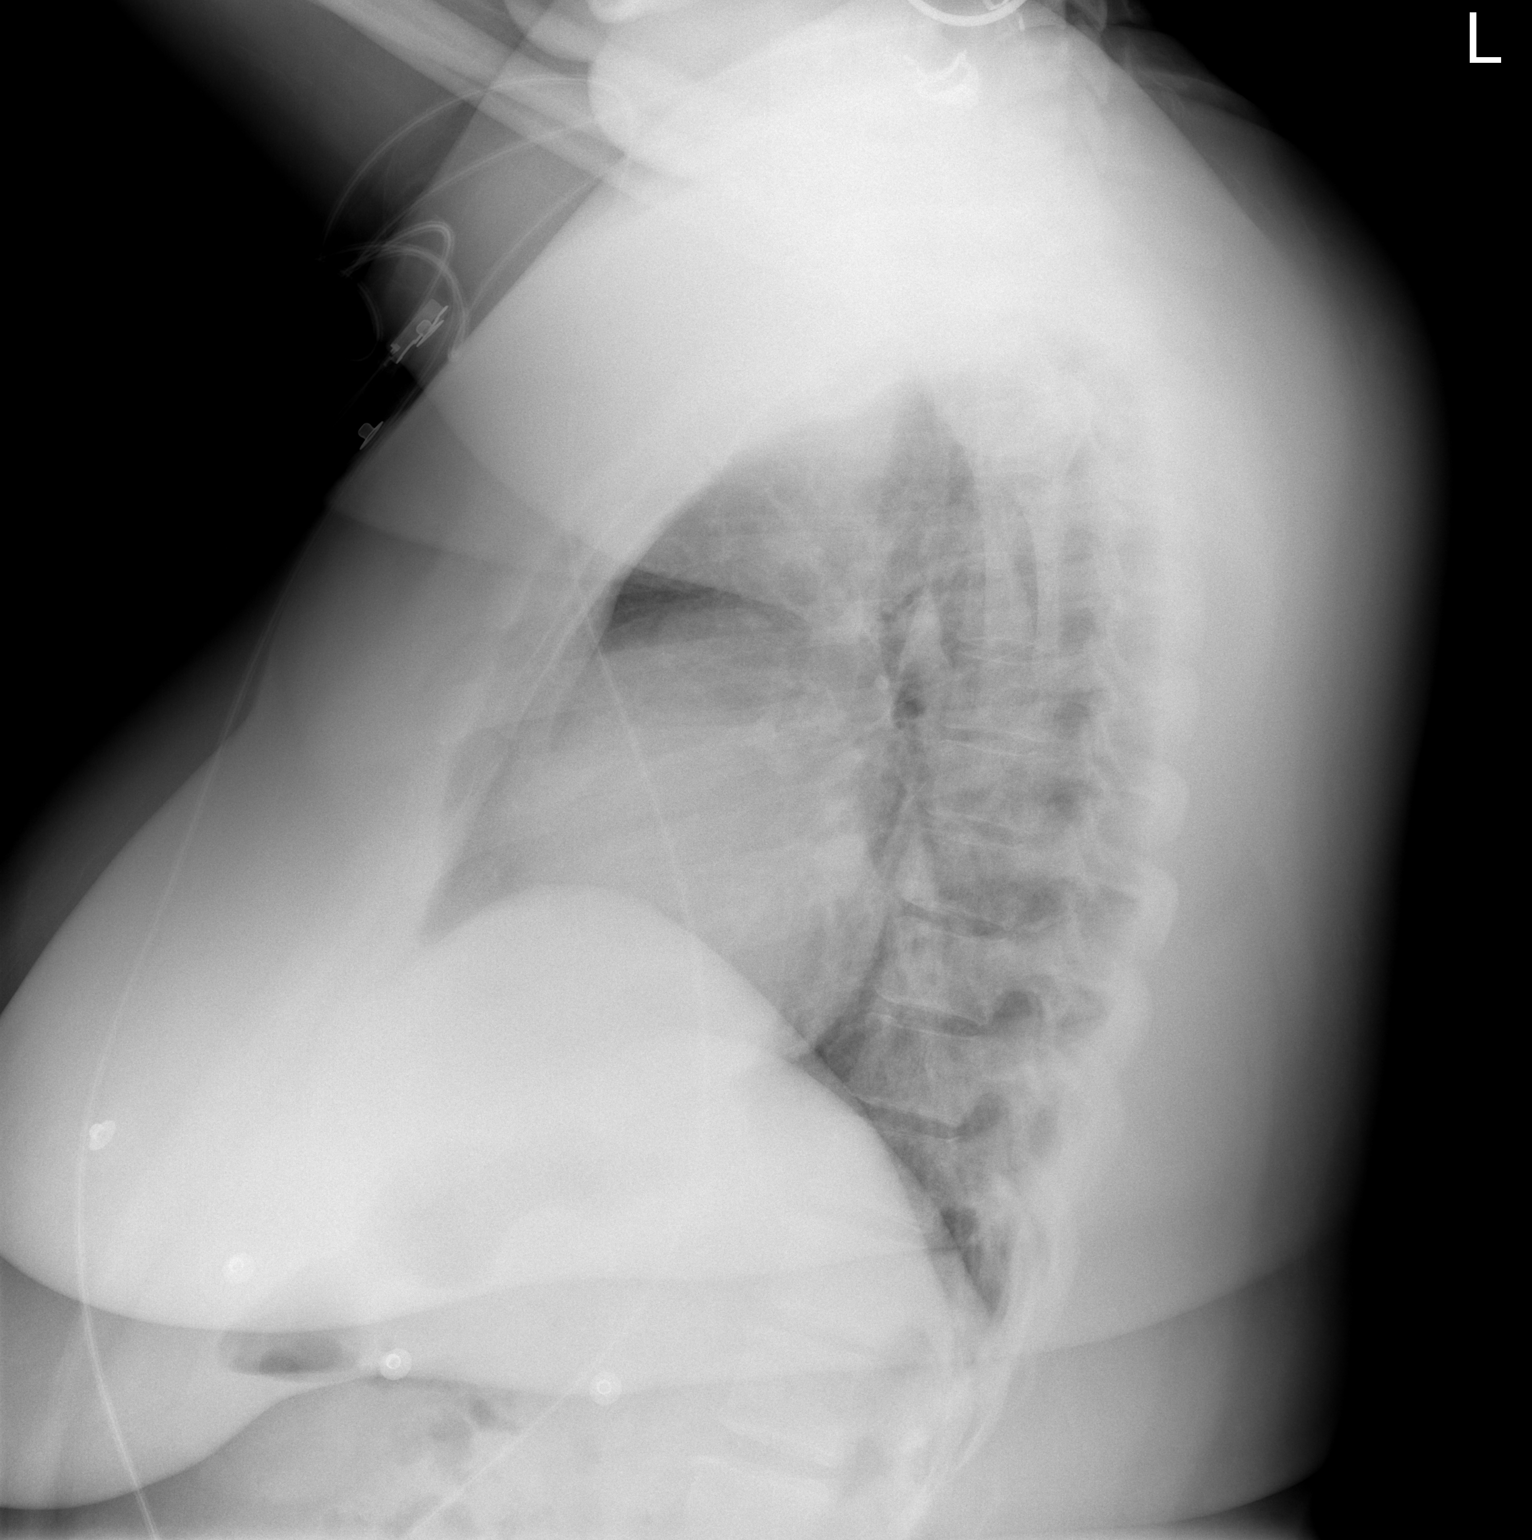

[2 of 2 positions shown; findings below may reference images not displayed]

FINDINGS: Heart size is UPPER normal.. There are no focal consolidations or
pleural effusions. No pulmonary edema.
IMPRESSION: No evidence for acute pulmonary abnormality.

## 2019-10-11 ENCOUNTER — Ambulatory Visit: Payer: Medicaid Other | Admitting: Obstetrics & Gynecology

## 2019-11-01 ENCOUNTER — Encounter: Payer: Self-pay | Admitting: Obstetrics & Gynecology

## 2019-11-01 ENCOUNTER — Other Ambulatory Visit: Payer: Self-pay

## 2019-11-01 ENCOUNTER — Ambulatory Visit: Payer: Medicaid Other | Admitting: Obstetrics & Gynecology

## 2019-11-01 VITALS — BP 163/93 | HR 81 | Wt 268.4 lb

## 2019-11-01 DIAGNOSIS — N938 Other specified abnormal uterine and vaginal bleeding: Secondary | ICD-10-CM

## 2019-11-01 NOTE — Progress Notes (Signed)
Patient ID: Paula Giles, female   DOB: 07/23/1986, 34 y.o.   MRN: [id number] Patient seen and assessed by nursing staff during this encounter. I have reviewed the chart and agree with the documentation and plan. I have also made any necessary editorial changes.  Emeterio Reeve, MD 11/01/2019 9:10 PM

## 2019-11-01 NOTE — Progress Notes (Signed)
New Patient is in the office, transfer from [facility]. Pt reports having menstrual cycles 2 times a month. Reports heavy bleeding and states that she was told that she had small fibroids a few years ago.   Took pt to exam room and pt stated that she wants a female provider and will re-schedule her appt.

## 2019-12-05 ENCOUNTER — Ambulatory Visit: Payer: Medicaid Other | Admitting: Obstetrics and Gynecology

## 2020-06-20 ENCOUNTER — Telehealth: Payer: Self-pay

## 2020-06-20 NOTE — Telephone Encounter (Signed)
I was returning Paula Giles's call to notify her that the office isn't currently taking any new medicaid patients.

## 2020-06-29 ENCOUNTER — Other Ambulatory Visit: Payer: Self-pay

## 2020-06-29 ENCOUNTER — Ambulatory Visit (HOSPITAL_COMMUNITY)
Admission: EM | Admit: 2020-06-29 | Discharge: 2020-06-29 | Disposition: A | Discharge status: Home / Self Care | Payer: Medicaid Other | Attending: Family Medicine | Admitting: Family Medicine

## 2020-06-29 ENCOUNTER — Ambulatory Visit (INDEPENDENT_AMBULATORY_CARE_PROVIDER_SITE_OTHER): Payer: Medicaid Other

## 2020-06-29 ENCOUNTER — Encounter (HOSPITAL_COMMUNITY): Payer: Self-pay

## 2020-06-29 DIAGNOSIS — N939 Abnormal uterine and vaginal bleeding, unspecified: Secondary | ICD-10-CM

## 2020-06-29 DIAGNOSIS — S161XXA Strain of muscle, fascia and tendon at neck level, initial encounter: Secondary | ICD-10-CM | POA: Diagnosis not present

## 2020-06-29 DIAGNOSIS — R0789 Other chest pain: Secondary | ICD-10-CM | POA: Diagnosis not present

## 2020-06-29 DIAGNOSIS — R079 Chest pain, unspecified: Secondary | ICD-10-CM

## 2020-06-29 MED ORDER — NAPROXEN 500 MG PO TABS: 500.0000 mg | ORAL_TABLET | Freq: Two times a day (BID) | ORAL | 0 refills | Status: DC

## 2020-06-29 MED ORDER — CYCLOBENZAPRINE HCL 5 MG PO TABS: 5.0000 mg | ORAL_TABLET | Freq: Two times a day (BID) | ORAL | 0 refills | Status: DC | PRN

## 2020-06-29 MED ORDER — MEGESTROL ACETATE 40 MG PO TABS: 40.0000 mg | ORAL_TABLET | Freq: Two times a day (BID) | ORAL | 0 refills | Status: DC
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
## 2020-06-29 NOTE — Discharge Instructions (Signed)
Xray without fracture MOnitor for continued gradual improvement Use anti-inflammatories for pain/swelling. You may take up to 800 mg Ibuprofen every 8 hours with food. You may supplement Ibuprofen with Tylenol [phone] mg every 8 hours.  OR naprosyn twice daily Gentle stertching Alternate ice and heat Follow up if not improving

## 2020-07-25 ENCOUNTER — Encounter: Payer: Medicaid Other | Admitting: Student

## 2020-07-25 ENCOUNTER — Telehealth: Payer: Self-pay | Admitting: *Deleted

## 2020-07-25 NOTE — Telephone Encounter (Signed)
Call placed to patient for missed appt. No answer and no VMB set up. Hubbard Hartshorn, BSN, RN-BC

## 2020-08-09 ENCOUNTER — Other Ambulatory Visit: Payer: Self-pay

## 2020-08-09 ENCOUNTER — Ambulatory Visit (INDEPENDENT_AMBULATORY_CARE_PROVIDER_SITE_OTHER): Payer: Medicaid Other | Admitting: Student

## 2020-08-09 ENCOUNTER — Encounter: Payer: Self-pay | Admitting: Student

## 2020-08-09 VITALS — BP 154/90 | HR 91 | Wt 281.3 lb

## 2020-08-09 DIAGNOSIS — N939 Abnormal uterine and vaginal bleeding, unspecified: Secondary | ICD-10-CM | POA: Diagnosis not present

## 2020-08-09 DIAGNOSIS — I10 Essential (primary) hypertension: Secondary | ICD-10-CM

## 2020-08-09 DIAGNOSIS — F332 Major depressive disorder, recurrent severe without psychotic features: Secondary | ICD-10-CM | POA: Diagnosis not present

## 2020-08-09 DIAGNOSIS — F410 Panic disorder [episodic paroxysmal anxiety] without agoraphobia: Secondary | ICD-10-CM | POA: Diagnosis not present

## 2020-08-09 DIAGNOSIS — E669 Obesity, unspecified: Secondary | ICD-10-CM | POA: Diagnosis not present

## 2020-08-09 DIAGNOSIS — R319 Hematuria, unspecified: Secondary | ICD-10-CM | POA: Diagnosis not present

## 2020-08-09 DIAGNOSIS — R635 Abnormal weight gain: Secondary | ICD-10-CM

## 2020-08-09 DIAGNOSIS — R7303 Prediabetes: Secondary | ICD-10-CM | POA: Diagnosis not present

## 2020-08-09 DIAGNOSIS — R7989 Other specified abnormal findings of blood chemistry: Secondary | ICD-10-CM | POA: Diagnosis not present

## 2020-08-09 DIAGNOSIS — F329 Major depressive disorder, single episode, unspecified: Secondary | ICD-10-CM | POA: Insufficient documentation

## 2020-08-09 DIAGNOSIS — D649 Anemia, unspecified: Secondary | ICD-10-CM | POA: Diagnosis not present

## 2020-08-09 DIAGNOSIS — F411 Generalized anxiety disorder: Secondary | ICD-10-CM

## 2020-08-09 DIAGNOSIS — E66813 Obesity, class 3: Secondary | ICD-10-CM

## 2020-08-09 DIAGNOSIS — F419 Anxiety disorder, unspecified: Secondary | ICD-10-CM

## 2020-08-09 LAB — POCT GLYCOSYLATED HEMOGLOBIN (HGB A1C): Hemoglobin A1C: 5.7 % — AB (ref 4.0–5.6)

## 2020-08-09 LAB — GLUCOSE, CAPILLARY: Glucose-Capillary: 96 mg/dL (ref 70–99)

## 2020-08-09 MED ORDER — TELMISARTAN-HCTZ 80-12.5 MG PO TABS: 1.0000 | ORAL_TABLET | Freq: Every day | ORAL | 2 refills | Status: DC

## 2020-08-09 MED ORDER — VENLAFAXINE HCL ER 75 MG PO CP24: 75.0000 mg | ORAL_CAPSULE | Freq: Every day | ORAL | 2 refills | Status: DC

## 2020-08-09 MED ORDER — CITALOPRAM HYDROBROMIDE 20 MG PO TABS: 20.0000 mg | ORAL_TABLET | Freq: Every day | ORAL | 0 refills | Status: DC

## 2020-08-09 NOTE — Assessment & Plan Note (Signed)
-   See "normocytic anemia"

## 2020-08-09 NOTE — Assessment & Plan Note (Addendum)
A: Patient endorses depressed mood, anhedonia, increased appetite, weight gain, insomnia, and psychomotor agitation, without active or passive SI or HI currently. Did have SI once in the past 1 year ago without plan. States symptoms are likely exacerbated by death of several family members/friends recently. See HPI for more details. PHQ9 today of 24.  P:  - Stop citalopram given no response (has been off this for 2 weeks)  - Start Effexor 75mg  daily  - For now, continue Trazodone for sleep - May need additional medication for insomnia given trazodone's limited effectiveness for her in future - Consider need for psychiatry referral if no response to SNRI

## 2020-08-09 NOTE — Assessment & Plan Note (Addendum)
A: Patient endorses weight gain of 25lbs over the past month. Does endorse increased appetite and overeating. She is on megestrol which could be attributing, although takes this infrequently. Likely worsened in the setting of worsened depression over the past several months.   P:  - Check TSH - Check lipid panel

## 2020-08-09 NOTE — Progress Notes (Signed)
   CC: Anxiety   HPI:  [name] is a 35 y.o. lady with PMHx dysfunctional uterine bleeding, normocytic anemia, UTI, elevated LFT's, anxiety, and reported depression, presenting to establish care, with chief complaints of anxiety and depression. She states that she did previously follow with a PCP, although wishes to establish with a new provider. She states that she has been struggling with anxiety lifelong as well as more recent depression over the last few months. She states that she always over-thinks, worried about multiple things including finances, her own health, her kids. She states that she feels panicked when leaving the home and when in large crowds intermittently and more recently has been avoiding her own family and friends, "scared of what might happen" if she goes anywhere. She also endorses feeling down and depressed with anhedonia consistently over the past couple of months, with 25lb weight gain in 1 month, over-eating, difficulties falling and staying asleep, decreased energy, and hyper-alertness since the death of several of her family members and friends over the last several months. She did have SI last year although without clear plan and denies any current HI, SI, visual or auditory hallucinations currently. She is bothered that she has no motivation to do her usual activities. She enjoys going to her same psychologist she has gone to for 10 years, although prefers not to see a psychiatrist. She notes frustration with being taken off Xanax two years ago for her anxiety/panic attacks, which she notes worked well for her. She stopped taking her Citalopram, which was recently increased to 40mg  daily, cold 2 weeks ago. She has not had success with Prozac, Lexapro, and many other medications in the past and notes she has been told to try Lithium previously although denies any current or prior hx of manic symptoms.   Also notes urinary frequency, dry mouth, intermittent  stabbing pains in her feet, left upper abdominal pain, and intermittent constipation.   Past Medical History:  Diagnosis Date  . Anxiety   . BV (bacterial vaginosis)   . Irregular menstrual bleeding   . Mood disorder ([facility])   . Seasonal allergies    Family Hx: grandmother and father had early heart disease. Grandmother's twin had anxiety. No other psychiatric hx in family. Father had HTN.   Surgical Hx: C-section.   Social Hx: Patient drinks wine nightly (up to 1 bottle at times). Denies ever smoking or any other illicit drug use.   Review of Systems:  All others negative except as noted above in HPI.   Physical Exam:  Vitals:   08/09/20 1433 08/09/20 1511  BP: (!) 182/106 (!) 154/90  Pulse: (!) 103 91  SpO2: 98%   Weight: 281 lb 4.8 oz (127.6 kg)    General: Patient is obese. She appears well. No acute distress. Eyes: Sclera non-icteric. No conjunctival injection.  HENT: Neck is supple. No nasal discharge. Respiratory: Lungs are CTA, bilaterally. No wheezes, rales, or rhonchi.  Cardiovascular: Regular rate and rhythm. No murmurs, rubs, or gallops. No lower extremity edema. Abdominal: Soft and non-tender to palpation. Bowel sounds intact. No rebound or guarding. Skin: No lesions. No rashes.  Psych: Normal affect. Normal tone of voice.   Assessment & Plan:   See Encounters Tab for problem based charting.  Patient discussed with Dr. 10/07/20.  Criselda Peaches, MD 08/09/2020, 9:08 PM Pager: [phone]

## 2020-08-09 NOTE — Patient Instructions (Addendum)
Paula Giles,  I am sorry to hear that you have been struggling with anxiety and depression. It sounds as if Citalopram is not working for you, so we are going to try Effexor.   Please DECREASE citalopram to 20mg  daily for 7 days. I have filled a new prescription for this if your pills cannot be cut in half.   While taking your lower dose of Citalopram,  Please START taking Effexor 1 tablet daily.   It is very important that you continue taking these medications daily even though you may not notice a response for a couple of weeks.   We will also check your liver and kidney function, cholesterol, blood sugars, urine, thyroid, and blood counts.   I will call you with results and notify our front desk to call to schedule you a follow up appointment in 2 weeks to see how you are doing.   We can do you with results and notify our front desk to call to schedule you a follow up appointment in 2 weeks to see how you are doing.   We can do your PAP smear at a future visit and readdress any concerns you have. If you have any questions in the meantime or any new or concerning symptoms please call [phone].   Thank you and take care,  Dr. Konrad Penta  Agoraphobia Agoraphobia is a mental health disorder in which a person fears going out in public places where he or she may feel helpless, trapped, or embarrassed in the event of a panic attack. People with this condition have a fear of losing control during a panic attack, and they often start to avoid the situations that they fear or insist on having another person go with them. Agoraphobia may interfere with normal daily activities and personal relationships. People with severe agoraphobia may become completely homebound and dependent on others for daily tasks, such as grocery shopping and taking care of errands. Agoraphobia is a type of anxiety. It usually begins before age 72, but it can start in older adult years. People with agoraphobia are at risk  for other anxiety disorders, depression, and substance abuse. What are the causes? The cause of this condition is not known. A variety of factors such as fear of sensations and emotions in anxiety (anxiety sensitivity), family history of anxiety (genetics), and stressful events may contribute to this condition. What increases the risk? You are more likely to develop this condition if:  You are a woman.  You have a panic disorder.  You have family members with agoraphobia. What are the signs or symptoms? You may have agoraphobia if you have any of the following symptoms for 6 months or longer:  Intense fear arising from two or more of the following: ? Using public transportation, such as cars, buses, planes, trains, or ships. ? Being in open spaces, such as parking lots, shopping malls, or bridges. ? Being in enclosed spaces, such as shops, theaters, or elevators. ? Standing in line or being in a crowd. ? Being outside the home alone.  Fear of being unable to escape or get help if feared events occur. These events include: ? Panic attack. ? Loss of bowel control in older adults.  Reacting to feared situations by: ? Avoiding them. ? Requiring the presence of a companion. ? Enduring them with intense fear or anxiety.  Fear or anxiety that is out of proportion to the actual danger that is posed by the event and the situation. How is  this diagnosed? This condition is diagnosed based on:  Your symptoms. You will be asked questions about your fears and how they have affected you.  Your medical history and your use of medicines, alcohol, or drugs.  Physical exam and lab tests. These are usually ordered to rule out other problems that may be causing your symptoms. You may be referred to a mental health specialist (psychiatrist or psychologist). How is this treated? This condition is usually treated using a combination of counseling and medicines.  Counseling or talk therapy. Talk  therapy is provided by mental health specialists. The following forms of talk therapy can be especially helpful: ? Cognitive behavioral therapy (CBT). CBT helps you to recognize and change unrealistic thoughts and beliefs that contribute to your fears. You will learn that body changes associated with anxiety (such as increased heart rate and breathing) are completely normal and expected. ? Exposure therapy. This type of therapy helps you to face and overcome your fears in a relaxed state and in a safe environment. Exposures are usually approached in a systematic way, starting with situations that provoke less fear and building up to situations that provoke more intense fear. Exposure therapy includes:  Imagined exposure. You will imagine fearful situations and expose yourself to them in your mind.  In vivo exposure. You will face your fears in the real world, such as by standing in a crowded place for a few minutes.  Interoceptive exposure. In a safe environment, you will practice experiencing body changes that are associated with panic attacks. One example is breathing through a straw to experience breathlessness.  Medicines. The following types of medicines may be helpful: ? Antidepressants. These can decrease general levels of anxiety and can help to prevent panic attacks. ? Benzodiazepines. These medicines block feelings of anxiety and panic. ? Beta-blockers. Beta blockers can reduce physical symptoms of anxiety, such as sweating, tremors, and a racing heart. They may help you to feel less tense and anxious. Follow these instructions at home: Lifestyle  Try to exercise. Get 150 or more minutes of physical activity each week. Also aim to do strengthening exercises two or more times a week.  Eat a healthy diet that includes plenty of vegetables, fruits, whole grains, low-fat dairy products, and lean protein. Do not eat a lot of foods that are high in solid fats, added sugars, or salt  (sodium).  Get the right amount and quality of sleep. Most adults need 7-9 hours of sleep each night.  Do not drink alcohol.  Do not use illegal drugs. General instructions  Take over-the-counter and prescription medicines only as told by your health care provider.  Keep all follow-up visits as told by your health care provider. This is important. Where to find more information  For more information, visit the website of the Anxiety and Depression Association of [place] (ADAA): https://www.clark.net/ Contact a health care provider if:  Your fear or anxiety gets worse.  You have new fears or anxieties. Get help right away if:  You have trouble breathing or have chest pain that you believe may not be part of a panic attack.  You have serious thoughts about hurting yourself or someone else. If you ever feel like you may hurt yourself or others, or have thoughts about taking your own life, get help right away. You can go to your nearest emergency department or call:  Your local emergency services (911 in the [place]  A suicide crisis helpline, such as the [facility]  at 720 365 7849. This is open 24 hours a day. Summary  Agoraphobia is a type of anxiety disorder that causes a person to avoid situations that he or she fears, such as being in public or being in crowded spaces.  People with agoraphobia often have panic attacks. They may avoid situations in which they feel escape is difficult or panic attacks are likely to occur.  Agoraphobia is treated with medicines or cognitive behavioral therapy (CBT). This information is not intended to replace advice given to you by your health care provider. Make sure you discuss any questions you have with your health care provider. Document Revised: 11/09/2018 Document Reviewed: 03/12/2017 Elsevier Patient Education  [place].  Major Depressive Disorder, Adult Major depressive disorder (MDD) is a mental health  condition. MDD often makes you feel sad, hopeless, or helpless. MDD can also cause symptoms in your body. MDD can affect your:  Work.  School.  Relationships.  Other normal activities. MDD can range from mild to very bad. It may occur once (single episode MDD). It can also occur many times (recurrent MDD). The main symptoms of MDD often include:  Feeling sad, depressed, or irritable most of the time.  Loss of interest. MDD symptoms also include:  Sleeping too much or too little.  Eating too much or too little.  A change in your weight.  Feeling tired (fatigue) or having low energy.  Feeling worthless.  Feeling guilty.  Trouble making decisions.  Trouble thinking clearly.  Thoughts of suicide or harming others.  Feeling weak.  Feeling agitated.  Keeping yourself from being around other people (isolation). Follow these instructions at home: Activity  Do these things as told by your doctor: ? Go back to your normal activities. ? Exercise regularly. ? Spend time outdoors. Alcohol  Talk with your doctor about how alcohol can affect your antidepressant medicines.  Do not drink alcohol. Or, limit how much alcohol you drink. ? This means no more than 1 drink a day for nonpregnant women and 2 drinks a day for men. One drink equals one of these:  12 oz of beer.  5 oz of wine.  1 oz of hard liquor. General instructions  Take over-the-counter and prescription medicines only as told by your doctor.  Eat a healthy diet.  Get plenty of sleep.  Find activities that you enjoy. Make time to do them.  Think about joining a support group. Your doctor may be able to suggest a group for you.  Keep all follow-up visits as told by your doctor. This is important. Where to find more information:  [employer] on Mental Illness: ? www.nami.[place]: ? https://carter.com/  National Suicide Prevention  Lifeline: ? 405-580-9713. This is free, 24-hour help. Contact a doctor if:  Your symptoms get worse.  You have new symptoms. Get help right away if:  You self-harm.  You see, hear, taste, smell, or feel things that are not present (hallucinate). If you ever feel like you may hurt yourself or others, or have thoughts about taking your own life, get help right away. You can go to your nearest emergency department or call:  Your local emergency services (911 in the [place]  A suicide crisis helpline, such as the National Suicide Prevention Lifeline: ? 416 205 1765. This is open 24 hours a day. This information is not intended to replace advice given to you by your health care provider. Make sure you discuss any questions you have with your  health care provider. Document Revised: 07/03/2017 Document Reviewed: 04/06/2016 Elsevier Patient Education  2020 [employer].

## 2020-08-09 NOTE — Assessment & Plan Note (Signed)
A: Patient endorses chronic history of anxiety with excessive worry regarding multiple aspects of life (finances, insurance, health, children) with psychomotor agitation and social avoidance / panic attacks, which interfere with daily living. She responded very well to Xanax in the past although this was discontinued 2 years ago. Does follow with counseling. Reluctant to see psychiatrist.   P:  - Start Effexor 75mg  daily  - Follow up visit in 2 weeks; consider uptitrating

## 2020-08-09 NOTE — Assessment & Plan Note (Signed)
A: Last CBC in chart shows Hgb 8.4 with normal MCV. Likely due to blood loss anemia in setting of menorrhagia, for which patient follows with gynecology. She had been prescribed ferrous sulfate pills in the distant past, which she has run out of. Vaginal symptoms currently resolved, although patient wondering if we may perform PAP.   P:  - Repeat CBC w/ diff - Consider repeat iron studies next visit if continues to be anemic/symptomatic  - Due for PAP smear next visit  - Continue to monitor for AUB

## 2020-08-09 NOTE — Assessment & Plan Note (Addendum)
A: Patient has a prolonged hx of chronic hematuria, observed on multiple urinalyses in the past. Denies urinary symptoms other than urinary frequency.   P: - Check urinalysis  - Consider referral to urology if hematuria present

## 2020-08-09 NOTE — Assessment & Plan Note (Addendum)
A: Patient endorses polydipsia, polyuria, and burning sensation in bilateral feet. Hb A1c this visit consistent with pre-diabetes with no known hx.   P: - Will assess for weight loss with control of depression/anxiety - Recommend avoidance of simple sugars and regular exercise  - Check CMP (given hx of elevated LFTs)

## 2020-08-09 NOTE — Assessment & Plan Note (Signed)
A: Patient's blood pressure improved on repeat measurement to 154/90. Patient attributes this to recent stressors and current anxiety. States she ran out of home Micardis HCT.   P: - Continue home telmisartan-HCTZ 80-12.5mg  daily  - Continue to monitor

## 2020-08-10 ENCOUNTER — Telehealth: Payer: Self-pay | Admitting: Student

## 2020-08-10 ENCOUNTER — Other Ambulatory Visit: Payer: Self-pay | Admitting: Student

## 2020-08-10 DIAGNOSIS — D509 Iron deficiency anemia, unspecified: Secondary | ICD-10-CM

## 2020-08-10 DIAGNOSIS — R7989 Other specified abnormal findings of blood chemistry: Secondary | ICD-10-CM

## 2020-08-10 DIAGNOSIS — R82998 Other abnormal findings in urine: Secondary | ICD-10-CM

## 2020-08-10 LAB — CMP14 + ANION GAP
ALT: 138 IU/L — ABNORMAL HIGH (ref 0–32)
AST: 139 IU/L — ABNORMAL HIGH (ref 0–40)
Albumin/Globulin Ratio: 1.4 (ref 1.2–2.2)
Albumin: 4.5 g/dL (ref 3.8–4.8)
Alkaline Phosphatase: 69 IU/L (ref 44–121)
Anion Gap: 15 mmol/L (ref 10.0–18.0)
BUN/Creatinine Ratio: 17 (ref 9–23)
BUN: 12 mg/dL (ref 6–20)
Bilirubin Total: 0.3 mg/dL (ref 0.0–1.2)
CO2: 19 mmol/L — ABNORMAL LOW (ref 20–29)
Calcium: 9.4 mg/dL (ref 8.7–10.2)
Chloride: 105 mmol/L (ref 96–106)
Creatinine, Ser: 0.7 mg/dL (ref 0.57–1.00)
GFR calc Af Amer: 130 mL/min/{1.73_m2} (ref >59)
GFR calc non Af Amer: 113 mL/min/{1.73_m2} (ref >59)
Globulin, Total: 3.3 g/dL (ref 1.5–4.5)
Glucose: 98 mg/dL (ref 65–99)
Potassium: 4.3 mmol/L (ref 3.5–5.2)
Sodium: 139 mmol/L (ref 134–144)
Total Protein: 7.8 g/dL (ref 6.0–8.5)

## 2020-08-10 LAB — CBC WITH DIFFERENTIAL/PLATELET
Basophils Absolute: 0 10*3/uL (ref 0.0–0.2)
Basos: 0 %
EOS (ABSOLUTE): 0.1 10*3/uL (ref 0.0–0.4)
Eos: 2 %
Hematocrit: 38.3 % (ref 34.0–46.6)
Hemoglobin: 12.6 g/dL (ref 11.1–15.9)
Immature Grans (Abs): 0 10*3/uL (ref 0.0–0.1)
Immature Granulocytes: 0 %
Lymphocytes Absolute: 1.5 10*3/uL (ref 0.7–3.1)
Lymphs: 23 %
MCH: 31.7 pg (ref 26.6–33.0)
MCHC: 32.9 g/dL (ref 31.5–35.7)
MCV: 97 fL (ref 79–97)
Monocytes Absolute: 0.7 10*3/uL (ref 0.1–0.9)
Monocytes: 10 %
Neutrophils Absolute: 4.3 10*3/uL (ref 1.4–7.0)
Neutrophils: 65 %
Platelets: 374 10*3/uL (ref 150–450)
RBC: 3.97 x10E6/uL (ref 3.77–5.28)
RDW: 12.4 % (ref 11.7–15.4)
WBC: 6.7 10*3/uL (ref 3.4–10.8)

## 2020-08-10 LAB — URINALYSIS, COMPLETE
Bilirubin, UA: NEGATIVE
Glucose, UA: NEGATIVE
Leukocytes,UA: NEGATIVE
Nitrite, UA: NEGATIVE
RBC, UA: NEGATIVE
Specific Gravity, UA: 1.026 (ref 1.005–1.030)
Urobilinogen, Ur: 0.2 mg/dL (ref 0.2–1.0)
pH, UA: 6 (ref 5.0–7.5)

## 2020-08-10 LAB — MICROSCOPIC EXAMINATION
Casts: NONE SEEN /lpf
Epithelial Cells (non renal): >10 /hpf — AB (ref 0–10)
WBC, UA: >30 /hpf — AB (ref 0–5)

## 2020-08-10 LAB — LIPID PANEL
Chol/HDL Ratio: 3.3 ratio (ref 0.0–4.4)
Cholesterol, Total: 138 mg/dL (ref 100–199)
HDL: 42 mg/dL (ref >39)
LDL Chol Calc (NIH): 83 mg/dL (ref 0–99)
Triglycerides: 65 mg/dL (ref 0–149)
VLDL Cholesterol Cal: 13 mg/dL (ref 5–40)

## 2020-08-10 LAB — TSH: TSH: 1.02 u[IU]/mL (ref 0.450–4.500)

## 2020-08-10 NOTE — Telephone Encounter (Signed)
Spoke with patient over the phone, informing her that her urine continued to have some blood in it, and with hx of chronic hematuria with calcium oxalate stones yesterday is concerning for kidney stones. She endorses lower back pain L > R but throbbing, worse on standing, which sounds more MSK without urinary sx's other than frequency. Also discussed that her liver enzymes are more elevated than in the past. She does note heavy alcohol use, although was informed other causes such as chronic hepatitis, autoimmune disease, NASH, or iron overload can cause similar elevations. She was notified her Hb A1c was 5.7, consistent with pre-diabetes, and was told her cholesterol, TSH, and blood counts are within normal limits. She says she has run out of ferrous sulfate that she had taken daily for 8 months, but notes she more recently has started taking "Huma-Flex". Has not yet picked up Effexor but states she will today.   Plan: - complete abdominal ultrasound (to assess for NASH / hydronephrosis / hydroureter)  - HbsAg/antibody, anti-HCV antibody  - AI hepatitis workup  - Check iron studies including ferritin  - Will notify front desk to schedule patient in 2 weeks for follow up with me for above  All questions answered and patient instructed to call [facility] with any questions.   Jeralyn Bennett, PGY1 08/10/2020, 10:40 AM Pager: [phone]

## 2020-08-13 NOTE — Progress Notes (Signed)
Internal Medicine Clinic Attending  Case discussed with Dr. Speakman  At the time of the visit.  We reviewed the resident's history and exam and pertinent patient test results.  I agree with the assessment, diagnosis, and plan of care documented in the resident's note.  

## 2020-08-14 ENCOUNTER — Telehealth: Payer: Self-pay | Admitting: *Deleted

## 2020-08-14 NOTE — Telephone Encounter (Signed)
Patient called in stating anti-depression med was not at pharmacy. Explained Effexor was sent on 08/09/2020 and we have a confirmed receipt. [facility] placed to [facility] States this med was filled at [facility] on [address] yesterday. She was not able to see who sent that Rx to [facility]. Call placed to [facility] but they are currently closed for lunch. Notified patient. Reviewed pharmacy list and 5 pharmacies removed. Patient is requesting med be cancelled at [facility] and [facility]. Will call [facility] after 1400 when they reopen. Hubbard Hartshorn, BSN, RN-BC

## 2020-08-23 ENCOUNTER — Encounter: Payer: Medicaid Other | Admitting: Student

## 2020-08-31 ENCOUNTER — Encounter: Payer: Medicaid Other | Admitting: Student

## 2020-09-07 IMAGING — DX DG CHEST 1V PORT
1 series · 1 of 1 positions shown · non-contrast
Comparison: Chest radiograph 05/03/2018

CLINICAL DATA: PResents with generalized body aches that began one
week ago today, associated with fever of 101.0 off and on. SHe
states she feels hot and has headaches but the body aches are
severe.

EXAM:
PORTABLE CHEST 1 VIEW

[chest ap]
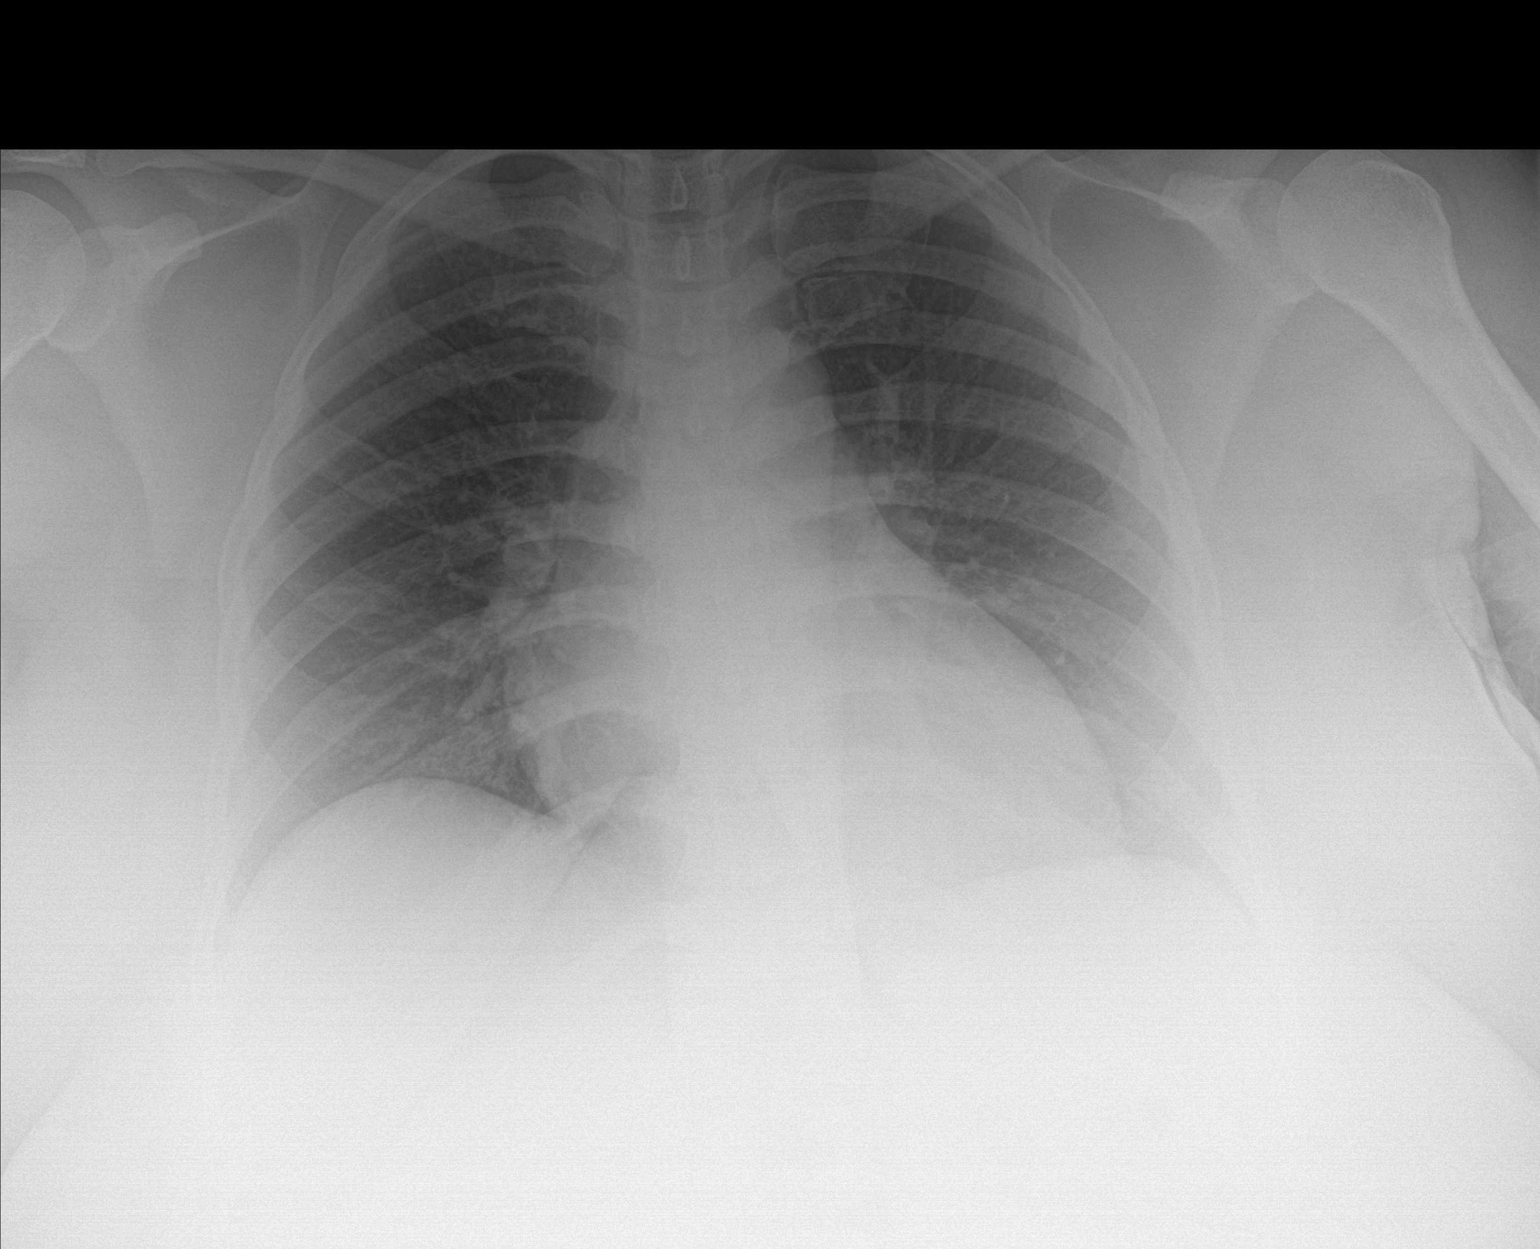

[1 of 1 positions shown; findings below may reference images not displayed]

FINDINGS: Stable cardiomediastinal contours. Heart size is upper limits of
normal. The lungs are clear. No pneumothorax or large pleural
effusion. The visualized skeletal structures are unremarkable.
IMPRESSION: No evidence of active disease in the chest.

## 2020-09-19 ENCOUNTER — Encounter: Payer: Medicaid Other | Admitting: Internal Medicine

## 2020-10-02 ENCOUNTER — Encounter: Payer: Medicaid Other | Admitting: Student

## 2020-10-04 ENCOUNTER — Encounter: Payer: Medicaid Other | Admitting: Student

## 2020-11-06 ENCOUNTER — Encounter: Payer: Medicaid Other | Admitting: Internal Medicine

## 2020-11-14 ENCOUNTER — Other Ambulatory Visit: Payer: Self-pay

## 2020-11-14 ENCOUNTER — Encounter: Payer: Medicaid Other | Admitting: Internal Medicine

## 2020-11-14 ENCOUNTER — Other Ambulatory Visit: Payer: Self-pay | Admitting: Student

## 2020-11-14 DIAGNOSIS — I10 Essential (primary) hypertension: Secondary | ICD-10-CM

## 2020-11-14 MED ORDER — TELMISARTAN-HCTZ 80-12.5 MG PO TABS: 1.0000 | ORAL_TABLET | Freq: Every day | ORAL | 2 refills | Status: DC

## 2020-11-14 NOTE — Telephone Encounter (Signed)
Called pt about refill but no answer.

## 2020-11-14 NOTE — Telephone Encounter (Signed)
Pt is calling again about her HTN medicine telmisartan-hydrochlorothiazide (MICARDIS HCT) 80-12.5 MG tablet sent to  [id number] [place] [place] [address] Phone:  [phone]  Fax:  [phone]

## 2020-11-18 ENCOUNTER — Other Ambulatory Visit: Payer: Self-pay | Admitting: Internal Medicine

## 2020-11-18 DIAGNOSIS — I10 Essential (primary) hypertension: Secondary | ICD-10-CM

## 2020-11-19 ENCOUNTER — Ambulatory Visit (INDEPENDENT_AMBULATORY_CARE_PROVIDER_SITE_OTHER): Payer: Medicaid Other | Admitting: Internal Medicine

## 2020-11-19 ENCOUNTER — Encounter: Payer: Self-pay | Admitting: Internal Medicine

## 2020-11-19 ENCOUNTER — Other Ambulatory Visit (HOSPITAL_COMMUNITY)
Admission: RE | Admit: 2020-11-19 | Discharge: 2020-11-19 | Disposition: A | Discharge status: Home / Self Care | Payer: Medicaid Other | Source: Ambulatory Visit | Attending: Student in an Organized Health Care Education/Training Program | Admitting: Student in an Organized Health Care Education/Training Program

## 2020-11-19 VITALS — BP 168/106 | HR 86 | Temp 98.2°F | Ht 65.0 in | Wt 282.5 lb

## 2020-11-19 DIAGNOSIS — F410 Panic disorder [episodic paroxysmal anxiety] without agoraphobia: Secondary | ICD-10-CM | POA: Diagnosis not present

## 2020-11-19 DIAGNOSIS — F411 Generalized anxiety disorder: Secondary | ICD-10-CM | POA: Diagnosis not present

## 2020-11-19 DIAGNOSIS — R7989 Other specified abnormal findings of blood chemistry: Secondary | ICD-10-CM | POA: Insufficient documentation

## 2020-11-19 DIAGNOSIS — R300 Dysuria: Secondary | ICD-10-CM | POA: Insufficient documentation

## 2020-11-19 DIAGNOSIS — N898 Other specified noninflammatory disorders of vagina: Secondary | ICD-10-CM | POA: Insufficient documentation

## 2020-11-19 DIAGNOSIS — I10 Essential (primary) hypertension: Secondary | ICD-10-CM | POA: Diagnosis not present

## 2020-11-19 DIAGNOSIS — B9689 Other specified bacterial agents as the cause of diseases classified elsewhere: Secondary | ICD-10-CM

## 2020-11-19 DIAGNOSIS — D509 Iron deficiency anemia, unspecified: Secondary | ICD-10-CM

## 2020-11-19 DIAGNOSIS — F419 Anxiety disorder, unspecified: Secondary | ICD-10-CM

## 2020-11-19 DIAGNOSIS — N760 Acute vaginitis: Secondary | ICD-10-CM

## 2020-11-19 MED ORDER — VENLAFAXINE HCL ER 75 MG PO CP24: 150.0000 mg | ORAL_CAPSULE | Freq: Every day | ORAL | 2 refills | Status: DC

## 2020-11-19 MED ORDER — VENLAFAXINE HCL ER 150 MG PO CP24: 150.0000 mg | ORAL_CAPSULE | Freq: Every day | ORAL | 2 refills | Status: DC

## 2020-11-19 MED ORDER — LOSARTAN POTASSIUM-HCTZ 50-12.5 MG PO TABS: 1.0000 | ORAL_TABLET | Freq: Every day | ORAL | 0 refills | Status: DC

## 2020-11-19 NOTE — Assessment & Plan Note (Signed)
Patient endorses dysuria intermittently since January.  States that she has lower abdominal pain, nausea and sometimes flank pain.  Describes a burning sensation with urination and increased frequency.  Denies any increased urgency.  At times her urine is pink-tinged.  The symptoms have been intermittent since January.  We will collect a UA today.  She is also having vaginal pain and discharge.  Symptoms may be overlapping.  We will check Pap smear and cervicovaginal testing today as well.

## 2020-11-19 NOTE — Progress Notes (Signed)
   CC: Vaginal discharge, dysuria, anxiety  HPI:  Paula Giles is a 35 y.o. with a past medical history listed below presenting for evaluation of her vaginal discharge, dysuria and anxiety. For details of today's visit and the status of his chronic medical issues please refer to the assessment and plan.   Past Medical History:  Diagnosis Date  . Anxiety   . BV (bacterial vaginosis)   . Irregular menstrual bleeding   . Mood disorder ([place])   . Seasonal allergies    Review of Systems:   Review of Systems  Constitutional: Negative for chills and fever.  Gastrointestinal: Positive for abdominal pain and nausea. Negative for constipation, diarrhea and vomiting.  Genitourinary: Positive for dysuria, flank pain, frequency and hematuria. Negative for urgency.     Physical Exam:  Vitals:   11/19/20 1530  BP: (!) 168/106  Pulse: 86  Temp: 98.2 F (36.8 C)  TempSrc: Oral  SpO2: 98%  Weight: 282 lb 8 oz (128.1 kg)  Height: 5\' 5"  (1.651 m)   Physical Exam Vitals reviewed.  Constitutional:      Appearance: She is well-developed.  Cardiovascular:     Rate and Rhythm: Normal rate and regular rhythm.     Heart sounds: Normal heart sounds.  Pulmonary:     Effort: Pulmonary effort is normal.     Breath sounds: Normal breath sounds.  Abdominal:     General: Abdomen is flat. Bowel sounds are normal. There is no distension.     Palpations: Abdomen is soft.     Tenderness: There is no abdominal tenderness. There is no right CVA tenderness or left CVA tenderness.  Genitourinary:    Vagina: Normal. No signs of injury. No vaginal discharge, erythema or tenderness.     Cervix: Normal.     Adnexa: Right adnexa normal and left adnexa normal.       Right: No mass, tenderness or fullness.         Left: No mass, tenderness or fullness.    Skin:    General: Skin is warm and dry.  Neurological:     Mental Status: She is alert and oriented to person, place, and time.  Psychiatric:         Mood and Affect: Mood normal. Mood is not anxious.        Behavior: Behavior normal.     Assessment & Plan:   See Encounters Tab for problem based charting.  Patient seen with Dr. Dareen Piano

## 2020-11-19 NOTE — Patient Instructions (Signed)
Thank you for allowing [place] to provide your care today. Today we discussed your lab results.   I have ordered labs for you. I will call if any are abnormal.    Today we made some changes to your medications.  Increase your effexor to two tablets daily.  Please follow-up in 2 months.    Should you have any questions or concerns please call the internal medicine clinic at [phone].

## 2020-11-19 NOTE — Assessment & Plan Note (Addendum)
Patient reports abnormal vaginal discharge and pain on and off since January.  States the discharge is increased in frequency and sometimes discolored and malodorous. She states she has been sexually active with 1 partner however did practice unsafe sex.  She is due for a Pap smear and is also interested in STI testing.  We will obtain a Pap smear with STI testing today.  Plan: Follow-up Pap smear and cervicovaginal ancillary test   ADDENDUM: + BV, will send in a prescription for metronidazole 500 mg bid for 7 days.

## 2020-11-19 NOTE — Assessment & Plan Note (Signed)
Vitals:   11/19/20 1530  BP: (!) 168/106  Pulse: 86  Temp: 98.2 F (36.8 C)  SpO2: 98%   Patient states that her insurance no longer covers her Micardis HCTZ.  Also states that her blood pressure is usually high when she comes into the clinic.  Will change her medication to losartan-hydrochlorothiazide 50-12.5 mg and follow-up in about 4 weeks.  Denies any chest pain, shortness of breath or headaches today.  Plan: Switch telmisartan-HCTZ to losartan-HCTZ 50-12.5 mg daily Follow-up in 4 weeks

## 2020-11-19 NOTE — Assessment & Plan Note (Signed)
Patient was recently started on Effexor 75 mg daily in January thousand 22.  She states that her anxiety has not improved.  Discussed uptitrating this dose to 2 tablets daily.  Patient is reluctant to see a psychiatrist however can revisit this if symptoms still do not improve  Plan: Increase Effexor to 150 mg Follow-up in 4-week

## 2020-11-19 NOTE — Assessment & Plan Note (Addendum)
During her last visit in January LFTs were elevated.  At that time work-up was ordered including lab work and abdominal ultrasound neither was completed.  Will reorder abdominal ultrasound as well as ferritin, hepatitis B, hepatitis C, HIV, mitochondrial antibodies and IBC panel.  Plan: Abdominal ultrasound Follow-up lab work  ADDENDUM: Adding on LFTs

## 2020-11-20 LAB — URINALYSIS, ROUTINE W REFLEX MICROSCOPIC
Bilirubin, UA: NEGATIVE
Glucose, UA: NEGATIVE
Ketones, UA: NEGATIVE
Leukocytes,UA: NEGATIVE
Nitrite, UA: NEGATIVE
Protein,UA: NEGATIVE
RBC, UA: NEGATIVE
Specific Gravity, UA: 1.015 (ref 1.005–1.030)
Urobilinogen, Ur: 0.2 mg/dL (ref 0.2–1.0)
pH, UA: 5.5 (ref 5.0–7.5)

## 2020-11-20 LAB — IRON AND TIBC
Iron Saturation: 16 % (ref 15–55)
Iron: 61 ug/dL (ref 27–159)
Total Iron Binding Capacity: 386 ug/dL (ref 250–450)
UIBC: 325 ug/dL (ref 131–425)

## 2020-11-20 LAB — CERVICOVAGINAL ANCILLARY ONLY
Bacterial Vaginitis (gardnerella): POSITIVE — AB
Chlamydia: NEGATIVE
Comment: NEGATIVE
Comment: NEGATIVE
Comment: NEGATIVE
Comment: NORMAL
Neisseria Gonorrhea: NEGATIVE
Trichomonas: NEGATIVE

## 2020-11-20 LAB — MITOCHONDRIAL ANTIBODIES: Mitochondrial Ab: <20 Units (ref 0.0–20.0)

## 2020-11-20 LAB — HIV ANTIBODY (ROUTINE TESTING W REFLEX): HIV Screen 4th Generation wRfx: NONREACTIVE

## 2020-11-20 LAB — HEPATITIS B SURFACE ANTIGEN: Hepatitis B Surface Ag: NEGATIVE

## 2020-11-20 LAB — FERRITIN: Ferritin: 64 ng/mL (ref 15–150)

## 2020-11-20 LAB — HEPATITIS C ANTIBODY: Hep C Virus Ab: <0.1 s/co ratio (ref 0.0–0.9)

## 2020-11-20 LAB — ANTI-SMOOTH MUSCLE ANTIBODY, IGG: Smooth Muscle Ab: 21 Units — ABNORMAL HIGH (ref 0–19)

## 2020-11-20 LAB — HEPATITIS B SURFACE ANTIBODY,QUALITATIVE: Hep B Surface Ab, Qual: REACTIVE

## 2020-11-21 LAB — CYTOLOGY - PAP
Adequacy: ABSENT
Comment: NEGATIVE
Diagnosis: NEGATIVE
High risk HPV: NEGATIVE

## 2020-11-22 MED ORDER — METRONIDAZOLE 500 MG PO TABS: 500.0000 mg | ORAL_TABLET | Freq: Two times a day (BID) | ORAL | 0 refills | Status: AC

## 2020-11-22 NOTE — Addendum Note (Signed)
Addended by: Mike Craze on: 11/22/2020 11:01 AM   Modules accepted: Orders

## 2020-11-22 NOTE — Progress Notes (Signed)
[facility] Medicine Clinic Attending ° °Case discussed with Dr. Rehman  At the time of the visit.  We reviewed the resident’s history and exam and pertinent patient test results.  I agree with the assessment, diagnosis, and plan of care documented in the resident’s note.  ° °

## 2020-11-26 LAB — CMP14 + ANION GAP
ALT: 112 [IU]/L — ABNORMAL HIGH (ref 0–32)
AST: 84 [IU]/L — ABNORMAL HIGH (ref 0–40)
Albumin/Globulin Ratio: 1.4 (ref 1.2–2.2)
Albumin: 4.4 g/dL (ref 3.8–4.8)
Alkaline Phosphatase: 77 [IU]/L (ref 44–121)
Anion Gap: 16 mmol/L (ref 10.0–18.0)
BUN/Creatinine Ratio: 11 (ref 9–23)
BUN: 11 mg/dL (ref 6–20)
Bilirubin Total: 0.4 mg/dL (ref 0.0–1.2)
CO2: 22 mmol/L (ref 20–29)
Calcium: 9.6 mg/dL (ref 8.7–10.2)
Chloride: 102 mmol/L (ref 96–106)
Creatinine, Ser: 1 mg/dL (ref 0.57–1.00)
Globulin, Total: 3.1 g/dL (ref 1.5–4.5)
Glucose: 88 mg/dL (ref 65–99)
Potassium: 4.6 mmol/L (ref 3.5–5.2)
Sodium: 140 mmol/L (ref 134–144)
Total Protein: 7.5 g/dL (ref 6.0–8.5)
eGFR: 75 mL/min/{1.73_m2}

## 2020-11-26 LAB — SPECIMEN STATUS REPORT

## 2020-12-05 ENCOUNTER — Telehealth: Payer: Self-pay | Admitting: Internal Medicine

## 2020-12-05 NOTE — Telephone Encounter (Signed)
Pt requesting a call back about her lab Results that were done on 11/19/2020.

## 2020-12-06 NOTE — Telephone Encounter (Signed)
Dr.Rehman is not in clinic today, but would like to follow this up personally. She plans to call patient this afternoon.

## 2021-01-03 ENCOUNTER — Ambulatory Visit (HOSPITAL_COMMUNITY): Payer: Medicaid Other

## 2021-01-11 NOTE — Addendum Note (Signed)
Addended by: Truddie Crumble on: 01/11/2021 12:05 PM   Modules accepted: Orders

## 2021-02-10 ENCOUNTER — Other Ambulatory Visit: Payer: Self-pay | Admitting: Internal Medicine

## 2021-02-10 DIAGNOSIS — I10 Essential (primary) hypertension: Secondary | ICD-10-CM

## 2021-02-28 ENCOUNTER — Telehealth: Payer: Self-pay

## 2021-02-28 NOTE — Telephone Encounter (Signed)
90 day supply was sent to [facility] on 02/11/21. Patient notified. She will call [facility] to get this ready.

## 2021-02-28 NOTE — Telephone Encounter (Signed)
losartan-hydrochlorothiazide (HYZAAR) 50-12.5 MG tablet, REFILL REQUEST @ [id number]- [place], [place] - [address]

## 2021-03-07 ENCOUNTER — Encounter: Payer: Medicaid Other | Admitting: Internal Medicine

## 2021-05-21 ENCOUNTER — Other Ambulatory Visit: Payer: Self-pay | Admitting: Student

## 2021-05-21 DIAGNOSIS — I10 Essential (primary) hypertension: Secondary | ICD-10-CM

## 2021-07-02 ENCOUNTER — Encounter (INDEPENDENT_AMBULATORY_CARE_PROVIDER_SITE_OTHER): Payer: Self-pay | Admitting: Primary Care

## 2021-07-02 ENCOUNTER — Ambulatory Visit (INDEPENDENT_AMBULATORY_CARE_PROVIDER_SITE_OTHER): Payer: Medicaid Other | Admitting: Primary Care

## 2021-07-02 ENCOUNTER — Other Ambulatory Visit: Payer: Self-pay

## 2021-07-02 VITALS — BP 140/90 | HR 100 | Temp 97.5°F | Ht 64.0 in | Wt 265.6 lb

## 2021-07-02 DIAGNOSIS — M5442 Lumbago with sciatica, left side: Secondary | ICD-10-CM

## 2021-07-02 DIAGNOSIS — F5101 Primary insomnia: Secondary | ICD-10-CM

## 2021-07-02 DIAGNOSIS — K2100 Gastro-esophageal reflux disease with esophagitis, without bleeding: Secondary | ICD-10-CM | POA: Diagnosis not present

## 2021-07-02 DIAGNOSIS — I10 Essential (primary) hypertension: Secondary | ICD-10-CM | POA: Diagnosis not present

## 2021-07-02 DIAGNOSIS — Z131 Encounter for screening for diabetes mellitus: Secondary | ICD-10-CM

## 2021-07-02 DIAGNOSIS — D649 Anemia, unspecified: Secondary | ICD-10-CM

## 2021-07-02 DIAGNOSIS — Z135 Encounter for screening for eye and ear disorders: Secondary | ICD-10-CM

## 2021-07-02 DIAGNOSIS — F4323 Adjustment disorder with mixed anxiety and depressed mood: Secondary | ICD-10-CM

## 2021-07-02 DIAGNOSIS — Z13220 Encounter for screening for lipoid disorders: Secondary | ICD-10-CM

## 2021-07-02 DIAGNOSIS — Z6841 Body Mass Index (BMI) 40.0 and over, adult: Secondary | ICD-10-CM

## 2021-07-02 DIAGNOSIS — L309 Dermatitis, unspecified: Secondary | ICD-10-CM

## 2021-07-02 DIAGNOSIS — Z7689 Persons encountering health services in other specified circumstances: Secondary | ICD-10-CM

## 2021-07-02 DIAGNOSIS — M5441 Lumbago with sciatica, right side: Secondary | ICD-10-CM

## 2021-07-02 LAB — POCT GLYCOSYLATED HEMOGLOBIN (HGB A1C): Hemoglobin A1C: 5.3 % (ref 4.0–5.6)

## 2021-07-02 MED ORDER — TRIAMCINOLONE ACETONIDE 0.5 % EX OINT: 1.0000 "application " | TOPICAL_OINTMENT | Freq: Two times a day (BID) | CUTANEOUS | 1 refills | Status: DC

## 2021-07-02 MED ORDER — PANTOPRAZOLE SODIUM 20 MG PO TBEC: DELAYED_RELEASE_TABLET | ORAL | 0 refills | Status: DC

## 2021-07-02 MED ORDER — VALSARTAN-HYDROCHLOROTHIAZIDE 160-25 MG PO TABS: 1.0000 | ORAL_TABLET | Freq: Every day | ORAL | 3 refills | Status: DC

## 2021-07-02 MED ORDER — BUSPIRONE HCL 5 MG PO TABS: 5.0000 mg | ORAL_TABLET | Freq: Two times a day (BID) | ORAL | 1 refills | Status: DC

## 2021-07-02 MED ORDER — TRAZODONE HCL 50 MG PO TABS: 25.0000 mg | ORAL_TABLET | Freq: Every evening | ORAL | 3 refills | Status: DC | PRN

## 2021-07-02 NOTE — Patient Instructions (Addendum)
Generalized Anxiety Disorder, Adult ?Generalized anxiety disorder (GAD) is a mental health condition. Unlike normal worries, anxiety related to GAD is not triggered by a specific event. These worries do not fade or get better with time. GAD interferes with relationships, work, and school. ?GAD symptoms can vary from mild to severe. People with severe GAD can have intense waves of anxiety with physical symptoms that are similar to panic attacks. ?What are the causes? ?The exact cause of GAD is not known, but the following are believed to have an impact: ?Differences in natural brain chemicals. ?Genes passed down from parents to children. ?Differences in the way threats are perceived. ?Development and stress during childhood. ?Personality. ?What increases the risk? ?The following factors may make you more likely to develop this condition: ?Being female. ?Having a family history of anxiety disorders. ?Being very shy. ?Experiencing very stressful life events, such as the death of a loved one. ?Having a very stressful family environment. ?What are the signs or symptoms? ?People with GAD often worry excessively about many things in their lives, such as their health and family. Symptoms may also include: ?Mental and emotional symptoms: ?Worrying excessively about natural disasters. ?Fear of being late. ?Difficulty concentrating. ?Fears that others are judging your performance. ?Physical symptoms: ?Fatigue. ?Headaches, muscle tension, muscle twitches, trembling, or feeling shaky. ?Feeling like your heart is pounding or beating very fast. ?Feeling out of breath or like you cannot take a deep breath. ?Having trouble falling asleep or staying asleep, or experiencing restlessness. ?Sweating. ?Nausea, diarrhea, or irritable bowel syndrome (IBS). ?Behavioral symptoms: ?Experiencing erratic moods or irritability. ?Avoidance of new situations. ?Avoidance of people. ?Extreme difficulty making decisions. ?How is this diagnosed? ?This  condition is diagnosed based on your symptoms and medical history. You will also have a physical exam. Your health care provider may perform tests to rule out other possible causes of your symptoms. ?To be diagnosed with GAD, a person must have anxiety that: ?Is out of his or her control. ?Affects several different aspects of his or her life, such as work and relationships. ?Causes distress that makes him or her unable to take part in normal activities. ?Includes at least three symptoms of GAD, such as restlessness, fatigue, trouble concentrating, irritability, muscle tension, or sleep problems. ?Before your health care provider can confirm a diagnosis of GAD, these symptoms must be present more days than they are not, and they must last for 6 months or longer. ?How is this treated? ?This condition may be treated with: ?Medicine. Antidepressant medicine is usually prescribed for long-term daily control. Anti-anxiety medicines may be added in severe cases, especially when panic attacks occur. ?Talk therapy (psychotherapy). Certain types of talk therapy can be helpful in treating GAD by providing support, education, and guidance. Options include: ?Cognitive behavioral therapy (CBT). People learn coping skills and self-calming techniques to ease their physical symptoms. They learn to identify unrealistic thoughts and behaviors and to replace them with more appropriate thoughts and behaviors. ?Acceptance and commitment therapy (ACT). This treatment teaches people how to be mindful as a way to cope with unwanted thoughts and feelings. ?Biofeedback. This process trains you to manage your body's response (physiological response) through breathing techniques and relaxation methods. You will work with a therapist while machines are used to monitor your physical symptoms. ?Stress management techniques. These include yoga, meditation, and exercise. ?A mental health specialist can help determine which treatment is best for you.  Some people see improvement with one type of therapy. However, other people require   a combination of therapies. Follow these instructions at home: Lifestyle Maintain a consistent routine and schedule. Anticipate stressful situations. Create a plan and allow extra time to work with your plan. Practice stress management or self-calming techniques that you have learned from your therapist or your health care provider. Exercise regularly and spend time outdoors. Eat a healthy diet that includes plenty of vegetables, fruits, whole grains, low-fat dairy products, and lean protein. Do not eat a lot of foods that are high in fat, added sugar, or salt (sodium). Drink plenty of water. Avoid alcohol. Alcohol can increase anxiety. Avoid caffeine and certain over-the-counter cold medicines. These may make you feel worse. Ask your pharmacist which medicines to avoid. General instructions Take over-the-counter and prescription medicines only as told by your health care provider. Understand that you are likely to have setbacks. Accept this and be kind to yourself as you persist to take better care of yourself. Anticipate stressful situations. Create a plan and allow extra time to work with your plan. Recognize and accept your accomplishments, even if you judge them as small. Spend time with people who care about you. Keep all follow-up visits. This is important. Where to find more information [place]: https://carter.com/ Substance Abuse and Mental Health Services: ktimeonline.com Contact a health care provider if: Your symptoms do not get better. Your symptoms get worse. You have signs of depression, such as: A persistently sad or irritable mood. Loss of enjoyment in activities that used to bring you joy. Change in weight or eating. Changes in sleeping habits. Get help right away if: You have thoughts about hurting yourself or others. If you ever feel like you may hurt  yourself or others, or have thoughts about taking your own life, get help right away. Go to your nearest emergency department or: Call your local emergency services (911 in the [place] Call a suicide crisis helpline, such as the [place] at (574)828-1890 or 988 in the [place]. This is open 24 hours a day in the [place] Text the Crisis Text Line at 442-487-5972 (in the [place].). Summary Generalized anxiety disorder (GAD) is a mental health condition that involves worry that is not triggered by a specific event. People with GAD often worry excessively about many things in their lives, such as their health and family. GAD may cause symptoms such as restlessness, trouble concentrating, sleep problems, frequent sweating, nausea, diarrhea, headaches, and trembling or muscle twitching. A mental health specialist can help determine which treatment is best for you. Some people see improvement with one type of therapy. However, other people require a combination of therapies. This information is not intended to replace advice given to you by your health care provider. Make sure you discuss any questions you have with your health care provider. Document Revised: 02/13/2021 Document Reviewed: 11/11/2020 Elsevier Patient Education  2022 [place]. Hypertension, Adult High blood pressure (hypertension) is when the force of blood pumping through the arteries is too strong. The arteries are the blood vessels that carry blood from the heart throughout the body. Hypertension forces the heart to work harder to pump blood and may cause arteries to become narrow or stiff. Untreated or uncontrolled hypertension can cause a heart attack, heart failure, a stroke, kidney disease, and other problems. A blood pressure reading consists of a higher number over a lower number. Ideally, your blood pressure should be below 120/80. The first ("top") number is called the systolic pressure. It is a measure of the  pressure in your arteries as your heart beats. The second ("bottom") number is called the diastolic pressure. It is a measure of the pressure in your arteries as the heart relaxes. What are the causes? The exact cause of this condition is not known. There are some conditions that result in or are related to high blood pressure. What increases the risk? Some risk factors for high blood pressure are under your control. The following factors may make you more likely to develop this condition: Smoking. Having type 2 diabetes mellitus, high cholesterol, or both. Not getting enough exercise or physical activity. Being overweight. Having too much fat, sugar, calories, or salt (sodium) in your diet. Drinking too much alcohol. Some risk factors for high blood pressure may be difficult or impossible to change. Some of these factors include: Having chronic kidney disease. Having a family history of high blood pressure. Age. Risk increases with age. Race. You may be at higher risk if you are African American. Gender. Men are at higher risk than women before age 27. After age 17, women are at higher risk than men. Having obstructive sleep apnea. Stress. What are the signs or symptoms? High blood pressure may not cause symptoms. Very high blood pressure (hypertensive crisis) may cause: Headache. Anxiety. Shortness of breath. Nosebleed. Nausea and vomiting. Vision changes. Severe chest pain. Seizures. How is this diagnosed? This condition is diagnosed by measuring your blood pressure while you are seated, with your arm resting on a flat surface, your legs uncrossed, and your feet flat on the floor. The cuff of the blood pressure monitor will be placed directly against the skin of your upper arm at the level of your heart. It should be measured at least twice using the same arm. Certain conditions can cause a difference in blood pressure between your right and left arms. Certain factors can cause  blood pressure readings to be lower or higher than normal for a short period of time: When your blood pressure is higher when you are in a health care provider's office than when you are at home, this is called white coat hypertension. Most people with this condition do not need medicines. When your blood pressure is higher at home than when you are in a health care provider's office, this is called masked hypertension. Most people with this condition may need medicines to control blood pressure. If you have a high blood pressure reading during one visit or you have normal blood pressure with other risk factors, you may be asked to: Return on a different day to have your blood pressure checked again. Monitor your blood pressure at home for 1 week or longer. If you are diagnosed with hypertension, you may have other blood or imaging tests to help your health care provider understand your overall risk for other conditions. How is this treated? This condition is treated by making healthy lifestyle changes, such as eating healthy foods, exercising more, and reducing your alcohol intake. Your health care provider may prescribe medicine if lifestyle changes are not enough to get your blood pressure under control, and if: Your systolic blood pressure is above 130. Your diastolic blood pressure is above 80. Your personal target blood pressure may vary depending on your medical conditions, your age, and other factors. Follow these instructions at home: Eating and drinking  Eat a diet that is high in fiber and potassium, and low in sodium, added sugar, and fat. An example eating plan is called the DASH (Dietary Approaches to Stop  Hypertension) diet. To eat this way: Eat plenty of fresh fruits and vegetables. Try to fill one half of your plate at each meal with fruits and vegetables. Eat whole grains, such as whole-wheat pasta, brown rice, or whole-grain bread. Fill about one fourth of your plate with whole  grains. Eat or drink low-fat dairy products, such as skim milk or low-fat yogurt. Avoid fatty cuts of meat, processed or cured meats, and poultry with skin. Fill about one fourth of your plate with lean proteins, such as fish, chicken without skin, beans, eggs, or tofu. Avoid pre-made and processed foods. These tend to be higher in sodium, added sugar, and fat. Reduce your daily sodium intake. Most people with hypertension should eat less than 1,500 mg of sodium a day. Do not drink alcohol if: Your health care provider tells you not to drink. You are pregnant, may be pregnant, or are planning to become pregnant. If you drink alcohol: Limit how much you use to: 0-1 drink a day for women. 0-2 drinks a day for men. Be aware of how much alcohol is in your drink. In the [place] one drink equals one 12 oz bottle of beer (355 mL), one 5 oz glass of wine (148 mL), or one 1 oz glass of hard liquor (44 mL). Lifestyle  Work with your health care provider to maintain a healthy body weight or to lose weight. Ask what an ideal weight is for you. Get at least 30 minutes of exercise most days of the week. Activities may include walking, swimming, or biking. Include exercise to strengthen your muscles (resistance exercise), such as Pilates or lifting weights, as part of your weekly exercise routine. Try to do these types of exercises for 30 minutes at least 3 days a week. Do not use any products that contain nicotine or tobacco, such as cigarettes, e-cigarettes, and chewing tobacco. If you need help quitting, ask your health care provider. Monitor your blood pressure at home as told by your health care provider. Keep all follow-up visits as told by your health care provider. This is important. Medicines Take over-the-counter and prescription medicines only as told by your health care provider. Follow directions carefully. Blood pressure medicines must be taken as prescribed. Do not skip doses of blood pressure  medicine. Doing this puts you at risk for problems and can make the medicine less effective. Ask your health care provider about side effects or reactions to medicines that you should watch for. Contact a health care provider if you: Think you are having a reaction to a medicine you are taking. Have headaches that keep coming back (recurring). Feel dizzy. Have swelling in your ankles. Have trouble with your vision. Get help right away if you: Develop a severe headache or confusion. Have unusual weakness or numbness. Feel faint. Have severe pain in your chest or abdomen. Vomit repeatedly. Have trouble breathing. Summary Hypertension is when the force of blood pumping through your arteries is too strong. If this condition is not controlled, it may put you at risk for serious complications. Your personal target blood pressure may vary depending on your medical conditions, your age, and other factors. For most people, a normal blood pressure is less than 120/80. Hypertension is treated with lifestyle changes, medicines, or a combination of both. Lifestyle changes include losing weight, eating a healthy, low-sodium diet, exercising more, and limiting alcohol. This information is not intended to replace advice given to you by your health care provider. Make sure you discuss any  questions you have with your health care provider. Document Revised: 03/31/2018 Document Reviewed: 03/31/2018 Elsevier Patient Education  [place].

## 2021-07-02 NOTE — Progress Notes (Signed)
New Patient Office Visit  Subjective:  Patient ID: Paula Giles, female    DOB: 24-Aug-1985  Age: 35 y.o. MRN: [id number]  CC:  Chief Complaint  Patient presents with   New Patient (Initial Visit)    Patient states she is having more issues with anxiety since last visit with dr   Medication Refill  Body mass index is 45.59 kg/m.   HPI Ms. Paula Giles is a 35 year old will be celebrating her birthday tomorrow morbid obese female morbid who presents for establishment of care.  Blood pressure is elevated 140/90 discussed blood pressure goal.  She is also experiencing a lot anxiety and stress PHQ elevated.  Past Medical History:  Diagnosis Date   Anxiety    BV (bacterial vaginosis)    Irregular menstrual bleeding    Mood disorder (HCC)    Seasonal allergies     Past Surgical History:  Procedure Laterality Date   CESAREAN SECTION  2009   WISDOM TOOTH EXTRACTION      No family history on file.  Social History   Socioeconomic History   Marital status: Single    Spouse name: Not on file   Number of children: Not on file   Years of education: Not on file   Highest education level: Not on file  Occupational History   Not on file  Tobacco Use   Smoking status: Never   Smokeless tobacco: Never  Substance and Sexual Activity   Alcohol use: Yes    Comment: socially   Drug use: No   Sexual activity: Yes    Birth control/protection: None  Other Topics Concern   Not on file  Social History Narrative   Not on file   Social Determinants of Health   Financial Resource Strain: Not on file  Food Insecurity: Not on file  Transportation Needs: Not on file  Physical Activity: Not on file  Stress: Not on file  Social Connections: Not on file  Intimate Partner Violence: Not on file   ROS Complete ROS pertinent positive and negative noted HPI  Objective:   Today's Vitals: BP 140/90 (BP Location: Right Arm, Patient Position: Sitting, Cuff Size: Large)   Pulse 100    Temp (!) 97.5 F (36.4 C) (Temporal)   Ht 5' 4"  (1.626 m)   Wt 265 lb 9.6 oz (120.5 kg)   LMP 05/17/2021 (Approximate)   SpO2 96%   BMI 45.59 kg/m   Physical exam: General: Vital signs reviewed.  Patient is well-developed and well-nourished, xx in no acute distress and cooperative with exam. Head: Normocephalic and atraumatic. Eyes: EOMI, conjunctivae normal, no scleral icterus. Neck: Supple, trachea midline, normal ROM, no JVD, masses, thyromegaly, or carotid bruit present. Cardiovascular: RRR, S1 normal, S2 normal, no murmurs, gallops, or rubs. Pulmonary/Chest: Clear to auscultation bilaterally, no wheezes, rales, or rhonchi. Abdominal: Soft, non-tender, non-distended, BS +, no masses, organomegaly, or guarding present. Musculoskeletal: No joint deformities, erythema, or stiffness, ROM full and nontender. Extremities: No lower extremity edema bilaterally,  pulses symmetric and intact bilaterally. No cyanosis or clubbing. Neurological: A&O x3, Strength is normal Skin: Warm, dry and intact. No rashes or erythema. Psychiatric: Normal mood and affect. speech and behavior is normal. Cognition and memory are normal.    Assessment & Plan:  Paula Giles was seen today for new patient (initial visit) and medication refill.  Diagnoses and all orders for this visit:  Encounter to establish care Establish care with PCP  Diabetic screening -  POCT glycosylated hemoglobin (Hb A1C) 5.3   Adjustment disorder with mixed anxiety and depressed mood BusPIRone (BUSPAR) 5 MG tablet; Take 1 tablet (5 mg total) by mouth 2 (two) times daily. -      traZODone (DESYREL) 50 MG tablet; Take 0.5-1 tablets (25-50 mg total) by mouth at bedtime as needed for sleep. Normocytic anemia -     CBC with Differential  Screening, lipid -     Lipid Panel  Morbidly obese (HCC) Morbid Obesity is > 40 indicating an excess in caloric intake or underlining conditions. This may lead to other co-morbidities. Lifestyle  modifications of diet and exercise may reduce obesity.   -     TSH + free T4  Primary hypertension Counseled on blood pressure goal of less than 130/80, low-sodium, DASH diet, medication compliance, 150 minutes of moderate intensity exercise per week. Discussed medication compliance, adverse effects.  -     valsartan-hydrochlorothiazide (DIOVAN-HCT) 160-25 MG tablet; Take 1 tablet by mouth daily. -     CMP14+EGFR  Gastroesophageal reflux disease with esophagitis without hemorrhage Discussed eating small frequent meal, reduction in acidic foods, fried foods ,spicy foods, alcohol caffeine and tobacco and certain medications. Avoid laying down after eating 74mns-1hour, elevated head of the bed.  -     pantoprazole (PROTONIX) 20 MG tablet; Take as needed for indigestion once daly  Primary insomnia  Insomnia    Difficulty going to sleep:     Started on Tranzodone 258mamy increase to 5071mt bedtime  Eczema, unspecified type -     triamcinolone ointment (KENALOG) 0.5 %; Apply 1 application topically 2 (two) times daily.  Midline low back pain with bilateral sciatica, unspecified chronicity Aggravating factors: certain movements and prolonged walking/standing. Alleviating factors: rest. Progressive LE weakness or saddle anesthesia: none. Extremity sensation changes or weakness: none. Ambulatory without difficulty. Normal bowel/bladder habits: yes; without urinary retention. Normal PO intake without n/v. No associated abdominal pain/n/v. Self treatment: has OTC analgesics, with minimal relief   Other orders -     busPIRone (BUSPAR) 5 MG tablet; Take 1 tablet (5 mg total) by mouth 2 (two) times daily. -     traZODone (DESYREL) 50 MG tablet; Take 0.5-1 tablets (25-50 mg total) by mouth at bedtime as needed for sleep.   Outpatient Encounter Medications as of 07/02/2021  Medication Sig   losartan-hydrochlorothiazide (HYZAAR) 50-12.5 MG tablet TAKE 1 TABLET BY MOUTH EVERY DAY   [DISCONTINUED]  clonazePAM (KLONOPIN) 0.5 MG tablet Take 1 tablet (0.5 mg total) by mouth 3 (three) times daily as needed for anxiety. (Patient not taking: Reported on 11/01/2019)   [DISCONTINUED] cyclobenzaprine (FLEXERIL) 5 MG tablet Take 1-2 tablets (5-10 mg total) by mouth 2 (two) times daily as needed for muscle spasms.   [DISCONTINUED] hydrochlorothiazide (MICROZIDE) 12.5 MG capsule Take 12.5 mg by mouth daily.   [DISCONTINUED] megestrol (MEGACE) 40 MG tablet Take 1 tablet (40 mg total) by mouth 2 (two) times daily. Until bleeding stops   [DISCONTINUED] naproxen (NAPROSYN) 500 MG tablet Take 1 tablet (500 mg total) by mouth 2 (two) times daily.   [DISCONTINUED] pantoprazole (PROTONIX) 20 MG tablet Take 1 tablet (20 mg total) by mouth daily. (Patient not taking: Reported on 11/01/2019)   [DISCONTINUED] venlafaxine XR (EFFEXOR XR) 150 MG 24 hr capsule Take 1 capsule (150 mg total) by mouth daily.   No facility-administered encounter medications on file as of 07/02/2021.    Follow-up: No follow-ups on file.   MicKerin PernaP

## 2021-07-09 ENCOUNTER — Other Ambulatory Visit (INDEPENDENT_AMBULATORY_CARE_PROVIDER_SITE_OTHER): Payer: Medicaid Other

## 2021-07-16 ENCOUNTER — Other Ambulatory Visit (INDEPENDENT_AMBULATORY_CARE_PROVIDER_SITE_OTHER): Payer: Medicaid Other

## 2021-08-13 ENCOUNTER — Ambulatory Visit (INDEPENDENT_AMBULATORY_CARE_PROVIDER_SITE_OTHER): Payer: Medicaid Other | Admitting: Primary Care

## 2021-11-22 ENCOUNTER — Ambulatory Visit (HOSPITAL_COMMUNITY)
Admission: EM | Admit: 2021-11-22 | Discharge: 2021-11-22 | Disposition: A | Discharge status: Home / Self Care | Payer: Medicaid Other | Attending: Family Medicine | Admitting: Family Medicine

## 2021-11-22 ENCOUNTER — Encounter (HOSPITAL_COMMUNITY): Payer: Self-pay | Admitting: Emergency Medicine

## 2021-11-22 DIAGNOSIS — K219 Gastro-esophageal reflux disease without esophagitis: Secondary | ICD-10-CM

## 2021-11-22 MED ORDER — OMEPRAZOLE 20 MG PO CPDR: 20.0000 mg | DELAYED_RELEASE_CAPSULE | Freq: Every day | ORAL | 0 refills | Status: DC

## 2021-11-22 NOTE — ED Triage Notes (Signed)
Pt reports back pains that radiates to chest that burns that been going on for 2 weeks . Reports that when gets under stress pain is worse. Eats hot sauce everyday and having issues with burning. Also having right neck soreness x week ?Pt reports lifts boxes at work  ?

## 2021-11-22 NOTE — Discharge Instructions (Addendum)
Schedule follow-up with [facility] for full complete physical. ?Your EKG is normal , as I suspected, symptoms are GERD related. ?

## 2021-11-22 NOTE — ED Provider Notes (Signed)
?[facility] ? ? ? ?CSN: [id number] ?Arrival date & time: 11/22/21  1633 ? ? ?  ? ?History   ?Chief Complaint ?Chief Complaint  ?Patient presents with  ? Back Pain  ? Heartburn  ? ? ?HPI ?Paula Giles is a 36 y.o. female.  ? ?HPI ?Patient with a medical history of anxiety and GERD, presents today for evaluation of burning in her chest and burning in her back.  Patient endorses that she was diagnosed with GERD and has been taking Protonix intermittently however over the last week she has been taking it consistently insists symptoms have not improved.  Patient also complains of belching, bloating and burping frequently.  She concerned that symptoms may be related to a cardiac etiology.  She is requesting an EKG.  She also complains cervical adenopathy however as history of seasonal allergies and has had some postnasal drainage.  Denies any other symptoms of cough, fever, or shortness of breath. ? ?Past Medical History:  ?Diagnosis Date  ? Anxiety   ? BV (bacterial vaginosis)   ? Irregular menstrual bleeding   ? Mood disorder ([place])   ? Seasonal allergies   ? ? ?Patient Active Problem List  ? Diagnosis Date Noted  ? Elevated LFTs 11/19/2020  ? Vaginal discharge 11/19/2020  ? Dysuria 11/19/2020  ? Hypertension 08/09/2020  ? Normocytic anemia 08/09/2020  ? Pre-diabetes 08/09/2020  ? Obesity 08/09/2020  ? Hematuria 08/09/2020  ? Generalized anxiety disorder with panic attacks 08/09/2020  ? MDD (major depressive disorder) 08/09/2020  ? Abnormal uterine bleeding 08/09/2020  ? ? ?Past Surgical History:  ?Procedure Laterality Date  ? CESAREAN SECTION  2009  ? WISDOM TOOTH EXTRACTION    ? ? ?OB History   ? ? Gravida  ?1  ? Para  ?1  ? Term  ?1  ? Preterm  ?   ? AB  ?   ? Living  ?   ?  ? ? SAB  ?   ? IAB  ?   ? Ectopic  ?   ? Multiple  ?   ? Live Births  ?   ?   ?  ?  ? ? ? ?Home Medications   ? ?Prior to Admission medications   ?Medication Sig Start Date End Date Taking? Authorizing Provider  ?omeprazole (PRILOSEC)  20 MG capsule Take 1 capsule (20 mg total) by mouth daily. 11/22/21  Yes Scot Jun, FNP  ?busPIRone (BUSPAR) 5 MG tablet Take 1 tablet (5 mg total) by mouth 2 (two) times daily. 07/02/21   Kerin Perna, NP  ?traZODone (DESYREL) 50 MG tablet Take 0.5-1 tablets (25-50 mg total) by mouth at bedtime as needed for sleep. 07/02/21   Kerin Perna, NP  ?triamcinolone ointment (KENALOG) 0.5 % Apply 1 application topically 2 (two) times daily. 07/02/21   Kerin Perna, NP  ?valsartan-hydrochlorothiazide (DIOVAN-HCT) 160-25 MG tablet Take 1 tablet by mouth daily. 07/02/21   Kerin Perna, NP  ? ? ?Family History ?No family history on file. ? ?Social History ?Social History  ? ?Tobacco Use  ? Smoking status: Never  ? Smokeless tobacco: Never  ?Substance Use Topics  ? Alcohol use: Yes  ?  Comment: socially  ? Drug use: No  ? ? ? ?Allergies   ?Latex ? ?Review of Systems ?Review of Systems ?Pertinent negatives listed in HPI  ?Physical Exam ?Triage Vital Signs ?ED Triage Vitals  ?Enc Vitals Group  ?   BP 11/22/21 1658 (!)  133/93  ?   Pulse Rate 11/22/21 1658 70  ?   Resp 11/22/21 1658 19  ?   Temp 11/22/21 1658 98.5 ?F (36.9 ?C)  ?   Temp Source 11/22/21 1658 Oral  ?   SpO2 11/22/21 1658 97 %  ?   Weight --   ?   Height --   ?   Head Circumference --   ?   Peak Flow --   ?   Pain Score 11/22/21 1656 4  ?   Pain Loc --   ?   Pain Edu? --   ?   Excl. in [place]? --   ? ?No data found. ? ?Updated Vital Signs ?BP (!) 133/93 (BP Location: Right Arm)   Pulse 70   Temp 98.5 ?F (36.9 ?C) (Oral)   Resp 19   SpO2 97%  ? ?Visual Acuity ?Right Eye Distance:   ?Left Eye Distance:   ?Bilateral Distance:   ? ?Right Eye Near:   ?Left Eye Near:    ?Bilateral Near:    ? ?Physical Exam ?Constitutional:   ?   Appearance: She is obese.  ?HENT:  ?   Head: Normocephalic and atraumatic.  ?Eyes:  ?   Extraocular Movements: Extraocular movements intact.  ?   Pupils: Pupils are equal, round, and reactive to light.   ?Cardiovascular:  ?   Rate and Rhythm: Normal rate and regular rhythm.  ?Pulmonary:  ?   Effort: Pulmonary effort is normal.  ?   Breath sounds: Normal breath sounds.  ?Abdominal:  ?   General: Abdomen is protuberant. Bowel sounds are increased.  ?   Palpations: Abdomen is soft.  ?   Tenderness: There is no abdominal tenderness.  ?   Hernia: No hernia is present.  ?Skin: ?   General: Skin is warm.  ?   Capillary Refill: Capillary refill takes less than 2 seconds.  ?Neurological:  ?   General: No focal deficit present.  ?   Mental Status: She is alert and oriented to person, place, and time.  ?Psychiatric:     ?   Mood and Affect: Mood normal.  ? ? ? ?UC Treatments / Results  ?Labs ?(all labs ordered are listed, but only abnormal results are displayed) ?Labs Reviewed - No data to display ? ?EKG ? ? ?Radiology ?No results found. ? ?Procedures ?Procedures (including critical care time) ? ?Medications Ordered in UC ?Medications - No data to display ? ?Initial Impression / Assessment and Plan / UC Course  ?I have reviewed the triage vital signs and the nursing notes. ? ?Pertinent labs & imaging results that were available during my care of the patient were reviewed by me and considered in my medical decision making (see chart for details). ? ?  ?GERD discontinue Protonix trial omeprazole daily 1 hour before meals. ?EKG, NSR with no ST changes. Recommend complete physical with [facility] as she is overdue for complete physical exam.  If symptoms worsen or become severe go immediately to the ER, otherwise follow-up with PCP or return here for evaluation ?Final Clinical Impressions(s) / UC Diagnoses  ? ?Final diagnoses:  ?Gastroesophageal reflux disease without esophagitis  ? ? ? ?Discharge Instructions   ? ?  ?Schedule follow-up with [facility] for full complete physical. ? ? ? ? ?ED Prescriptions   ? ? Medication Sig Dispense Auth. Provider  ? omeprazole (PRILOSEC) 20 MG capsule Take 1  capsule (20 mg total) by mouth daily. 90 capsule  Scot Jun, FNP  ? ?  ? ?PDMP not reviewed this encounter. ?  ?Scot Jun, FNP ?11/22/21 1843 ? ?

## 2021-11-23 ENCOUNTER — Other Ambulatory Visit (INDEPENDENT_AMBULATORY_CARE_PROVIDER_SITE_OTHER): Payer: Self-pay | Admitting: Primary Care

## 2021-11-25 NOTE — Telephone Encounter (Signed)
Sent to pcp

## 2021-11-25 NOTE — Telephone Encounter (Signed)
Needs appt

## 2021-11-27 ENCOUNTER — Ambulatory Visit (INDEPENDENT_AMBULATORY_CARE_PROVIDER_SITE_OTHER): Payer: Self-pay

## 2021-11-27 NOTE — Telephone Encounter (Signed)
Summary: Need appt after UC visit chest discomfort  ? Patient called in asking to get an appointment with Juluis Mire ASAP. Per patient she was seen in UC for chest discomfort say it was acid reflux and her medication was changed but she need to be seen immediately next appt available is 12/26/21. Please advise and call Ph# [phone]   ?  ?First attempt to reach pt. LM on VM to discuss further ?

## 2021-11-27 NOTE — Telephone Encounter (Signed)
Per agent: ?"Patient called in asking to get an appointment with Juluis Mire ASAP. Per patient she was seen in UC for chest discomfort say it was acid reflux and her medication was changed but she need to be seen immediately next appt available is 12/26/21. Please advise and call Ph# [phone]" ? ?  ?Chief Complaint: Swollen Lymph Node ?Symptoms: Nickel size swelling right side of neck, tender to touch,"Warm sensation chest and back." Reflux, seen in UC ?Frequency: 2 weeks ago ?Pertinent Negatives: Patient denies fever, congestion, cold symptoms ?Disposition: '[]'$ ED /'[]'$ Urgent Care (no appt availability in office) / '[]'$ Appointment(In office/virtual)/ '[]'$  [facility]/ '[]'$ Home Care/ '[]'$ Refused Recommended Disposition /'[]'$ [facility]/ '[x]'$  Follow-up with PCP ?Additional Notes: Pt requesting "At least have some blood work done." Advised next appt May 25, offered care at [facility], declined. ?Please advise. ?Reason for Disposition ? [1] Small swelling or lump AND [2] unexplained AND [3] present > 1 week ? ?Answer Assessment - Initial Assessment Questions ?1. APPEARANCE of SWELLING: "What does it look like?" (e.g., lymph node, insect bite, mole) ?    Lymph node ?2. SIZE: "How large is the swelling?" (e.g., inches, cm; or compare to size of pinhead, tip of pen, eraser, coin, pea, grape, ping pong ball)  ?    Nickel size ?3. LOCATION: "Where is the swelling located?" ?    Right side of neck ?4. ONSET: "When did the swelling start?" ?    2 weeks ?5. PAIN: "Is it painful?" If Yes, ask: "How much?" ?    Tender at times ?6. ITCH: "Does it itch?" If Yes, ask: "How much?" ?    no ?7. CAUSE: "What do you think caused the swelling?" ?    unsure ?8. OTHER SYMPTOMS: "Do you have any other symptoms?" (e.g., fever) ?    No ? ?Protocols used: Skin Lump or Localized Swelling-A-AH ? ?

## 2021-12-03 ENCOUNTER — Other Ambulatory Visit: Payer: Self-pay

## 2021-12-03 ENCOUNTER — Ambulatory Visit (HOSPITAL_COMMUNITY)
Admission: EM | Admit: 2021-12-03 | Discharge: 2021-12-03 | Disposition: A | Discharge status: Home / Self Care | Payer: Medicaid Other | Attending: Family Medicine | Admitting: Family Medicine

## 2021-12-03 ENCOUNTER — Encounter (HOSPITAL_COMMUNITY): Payer: Self-pay | Admitting: Emergency Medicine

## 2021-12-03 DIAGNOSIS — R590 Localized enlarged lymph nodes: Secondary | ICD-10-CM | POA: Insufficient documentation

## 2021-12-03 DIAGNOSIS — F419 Anxiety disorder, unspecified: Secondary | ICD-10-CM | POA: Insufficient documentation

## 2021-12-03 LAB — CBC WITH DIFFERENTIAL/PLATELET
Abs Immature Granulocytes: 0.02 10*3/uL (ref 0.00–0.07)
Basophils Absolute: 0.1 10*3/uL (ref 0.0–0.1)
Basophils Relative: 1 %
Eosinophils Absolute: 0.1 10*3/uL (ref 0.0–0.5)
Eosinophils Relative: 2 %
HCT: 33.5 % — ABNORMAL LOW (ref 36.0–46.0)
Hemoglobin: 10.2 g/dL — ABNORMAL LOW (ref 12.0–15.0)
Immature Granulocytes: 0 %
Lymphocytes Relative: 22 %
Lymphs Abs: 1.6 10*3/uL (ref 0.7–4.0)
MCH: 21.7 pg — ABNORMAL LOW (ref 26.0–34.0)
MCHC: 30.4 g/dL (ref 30.0–36.0)
MCV: 71.1 fL — ABNORMAL LOW (ref 80.0–100.0)
Monocytes Absolute: 0.8 10*3/uL (ref 0.1–1.0)
Monocytes Relative: 10 %
Neutro Abs: 4.8 10*3/uL (ref 1.7–7.7)
Neutrophils Relative %: 65 %
Platelets: 392 10*3/uL (ref 150–400)
RBC: 4.71 MIL/uL (ref 3.87–5.11)
RDW: 18.6 % — ABNORMAL HIGH (ref 11.5–15.5)
WBC: 7.3 10*3/uL (ref 4.0–10.5)
nRBC: 0 % (ref 0.0–0.2)

## 2021-12-03 MED ORDER — HYDROXYZINE HCL 25 MG PO TABS: 25.0000 mg | ORAL_TABLET | Freq: Four times a day (QID) | ORAL | 0 refills | Status: DC

## 2021-12-03 NOTE — Discharge Instructions (Addendum)
You have had labs (blood work) drawn today. We will call you with any significant abnormalities or if there is need to begin or change treatment or pursue further follow up.  You may also review your test results online through MyChart. If you do not have a MyChart account, instructions to sign up should be on your discharge paperwork.  

## 2021-12-03 NOTE — ED Triage Notes (Signed)
Reports having a panic attack at work and reports medicine is not working.  Patient has an appt set up for 5/25.  Patient wants to switch medicines, current regimen is not helping.   ? ?Patient reports intermittent fever for 2 weeks and right side of neck/throat is painful ?

## 2021-12-04 NOTE — ED Provider Notes (Addendum)
?[facility] ? ? ?[id number] ?12/03/21 Arrival Time: 1903 ? ?ASSESSMENT & PLAN: ? ?1. Anxiety   ?2. Cervical lymphadenopathy   ? ?Has f/u with PCP in a few weeks. ? ?Begin as needed: ?Meds ordered this encounter  ?Medications  ? hydrOXYzine (ATARAX) 25 MG tablet  ?  Sig: Take 1 tablet (25 mg total) by mouth every 6 (six) hours.  ?  Dispense:  20 tablet  ?  Refill:  0  ? ?Comfortable with 1-2 week observation of cervical LAD. CBC with anemia, otherwise no significant abnormalities. H/O anemia in past. ? ?Reviewed expectations re: course of current medical issues. Questions answered. ?Outlined signs and symptoms indicating need for more acute intervention. ?Patient verbalized understanding. ?After Visit Summary given. ? ? ?SUBJECTIVE: ? ?Paula Giles is a 36 y.o. female who presents with feelings of axiety; long-standing; followed by PCP. Reports "more stress lately" with exacerbation of underlying anxiety. Feels depressed at times. On BuSpar. Ques if helping. No SI/HI. Symptoms affecting sleep.  ?Unrelated, she has noted cervical LAD for 1-2 weeks with URI symptoms. Stable. Non-painful. Afebrile. ? ? ?Social History  ? ?Tobacco Use  ?Smoking Status Never  ?Smokeless Tobacco Never  ? ?Social History  ? ?Substance and Sexual Activity  ?Alcohol Use Yes  ? Comment: socially  ? ?Recreational drug use denied. ? ?OBJECTIVE: ? ?Vitals:  ? 12/03/21 1947  ?BP: (!) 169/103  ?Pulse: 92  ?Resp: (!) 22  ?Temp: 98.4 ?F (36.9 ?C)  ?TempSrc: Oral  ?SpO2: 100%  ?  ?General appearance: alert; appears NAD but tearful ?Eyes: PERRLA; EOMI; conjunctiva normal ?HENT: normocephalic; atraumatic; small bilateral cervical LAD that is non-tender ?Neck: supple ?Lungs: clear to auscultation bilaterally ?Heart: regular rate and rhythm ?Abdomen: soft, non-tender  ?Extremities: no edema; symmetrical with no gross deformities ?Skin: warm and dry ?Neurologic: normal gait; normal symmetric reflexes ?Psychological: alert and cooperative;  appropriate mood; normal affect ? ?Labs: ?Results for orders placed or performed during the hospital encounter of 12/03/21  ?CBC with Differential/Platelet  ?Result Value Ref Range  ? WBC 7.3 4.0 - 10.5 K/uL  ? RBC 4.71 3.87 - 5.11 MIL/uL  ? Hemoglobin 10.2 (L) 12.0 - 15.0 g/dL  ? HCT 33.5 (L) 36.0 - 46.0 %  ? MCV 71.1 (L) 80.0 - 100.0 fL  ? MCH 21.7 (L) 26.0 - 34.0 pg  ? MCHC 30.4 30.0 - 36.0 g/dL  ? RDW 18.6 (H) 11.5 - 15.5 %  ? Platelets 392 150 - 400 K/uL  ? nRBC 0.0 0.0 - 0.2 %  ? Neutrophils Relative % 65 %  ? Neutro Abs 4.8 1.7 - 7.7 K/uL  ? Lymphocytes Relative 22 %  ? Lymphs Abs 1.6 0.7 - 4.0 K/uL  ? Monocytes Relative 10 %  ? Monocytes Absolute 0.8 0.1 - 1.0 K/uL  ? Eosinophils Relative 2 %  ? Eosinophils Absolute 0.1 0.0 - 0.5 K/uL  ? Basophils Relative 1 %  ? Basophils Absolute 0.1 0.0 - 0.1 K/uL  ? Immature Granulocytes 0 %  ? Abs Immature Granulocytes 0.02 0.00 - 0.07 K/uL  ? ?Labs Reviewed  ?CBC WITH DIFFERENTIAL/PLATELET - Abnormal; Notable for the following components:  ?    Result Value  ? Hemoglobin 10.2 (*)   ? HCT 33.5 (*)   ? MCV 71.1 (*)   ? MCH 21.7 (*)   ? RDW 18.6 (*)   ? All other components within normal limits  ? ? ?Allergies  ?Allergen Reactions  ? Latex Rash  ? ? ?  Past Medical History:  ?Diagnosis Date  ? Anxiety   ? BV (bacterial vaginosis)   ? Irregular menstrual bleeding   ? Mood disorder ([place])   ? Seasonal allergies   ? ?Social History  ? ?Socioeconomic History  ? Marital status: Single  ?  Spouse name: Not on file  ? Number of children: Not on file  ? Years of education: Not on file  ? Highest education level: Not on file  ?Occupational History  ? Not on file  ?Tobacco Use  ? Smoking status: Never  ? Smokeless tobacco: Never  ?Vaping Use  ? Vaping Use: Never used  ?Substance and Sexual Activity  ? Alcohol use: Yes  ?  Comment: socially  ? Drug use: No  ? Sexual activity: Yes  ?  Birth control/protection: None  ?Other Topics Concern  ? Not on file  ?Social History Narrative  ? Not  on file  ? ?Social Determinants of Health  ? ?Financial Resource Strain: Not on file  ?Food Insecurity: Not on file  ?Transportation Needs: Not on file  ?Physical Activity: Not on file  ?Stress: Not on file  ?Social Connections: Not on file  ?Intimate Partner Violence: Not on file  ? ?History reviewed. No pertinent family history. ?Past Surgical History:  ?Procedure Laterality Date  ? CESAREAN SECTION  2009  ? WISDOM TOOTH EXTRACTION    ? ? ? ?  ?Vanessa Kick, MD ?12/04/21 1130 ? ?  ?Vanessa Kick, MD ?12/04/21 1131 ? ?

## 2021-12-25 ENCOUNTER — Other Ambulatory Visit (INDEPENDENT_AMBULATORY_CARE_PROVIDER_SITE_OTHER): Payer: Self-pay | Admitting: Primary Care

## 2021-12-25 NOTE — Telephone Encounter (Signed)
Routed to PCP 

## 2021-12-26 ENCOUNTER — Ambulatory Visit (INDEPENDENT_AMBULATORY_CARE_PROVIDER_SITE_OTHER): Payer: Medicaid Other | Admitting: Primary Care

## 2022-01-03 NOTE — Telephone Encounter (Signed)
Needs appt

## 2022-01-14 ENCOUNTER — Telehealth (INDEPENDENT_AMBULATORY_CARE_PROVIDER_SITE_OTHER): Payer: Medicaid Other | Admitting: Primary Care

## 2022-01-14 DIAGNOSIS — J0100 Acute maxillary sinusitis, unspecified: Secondary | ICD-10-CM | POA: Diagnosis not present

## 2022-01-14 MED ORDER — FLUCONAZOLE 150 MG PO TABS: 150.0000 mg | ORAL_TABLET | Freq: Every day | ORAL | 1 refills | Status: DC

## 2022-01-14 MED ORDER — AMOXICILLIN-POT CLAVULANATE 875-125 MG PO TABS: 1.0000 | ORAL_TABLET | Freq: Two times a day (BID) | ORAL | 0 refills | Status: DC

## 2022-01-14 NOTE — Progress Notes (Signed)
  [facility]  Virtual Visit  I connected with Paula Giles, on 01/18/2022 at 3:47 PM through an audio and video application verified that I am speaking with the correct person using two identifiers.   Consent: I discussed the limitations, risks, security and privacy concerns of performing an evaluation and management service by telephone and the availability of in person appointments. I also discussed with the patient that there may be a patient responsible charge related to this service. The patient expressed understanding and agreed to proceed.   Location of Patient: Home  Location of Provider: [facility] [place]   Persons participating in Telemedicine visit: Derenda Mis,  NP  History of Present Illness: [place]. Paula Giles is a 36 year old female having a virtual visit she complaining of rhinitis, nasal congestion , head ache  Asked patient to follow direction she did palpated her frontal and maxillary lymphoids  and submandibular (left) all areas tender and painful. Cervical chain negative for tenderness and pain.Patient has No chest pain, No abdominal pain - No Nausea, No new weakness tingling or numbness, No Cough - shortness of breath.  ROS Comprehensive ROS Pertinent positive and negative noted in HPI     Current Outpatient Medications on File Prior to Visit  Medication Sig Dispense Refill   omeprazole (PRILOSEC) 20 MG capsule Take 1 capsule (20 mg total) by mouth daily. 90 capsule 0   traZODone (DESYREL) 50 MG tablet TAKE 1/2 TO 1 TABLET BY MOUTH AT BEDTIME AS NEEDED FOR SLEEP 90 tablet 1   triamcinolone ointment (KENALOG) 0.5 % Apply 1 application topically 2 (two) times daily. 45 g 1   valsartan-hydrochlorothiazide (DIOVAN-HCT) 160-25 MG tablet Take 1 tablet by mouth daily. 90 tablet 3   No current facility-administered medications on file prior to visit.   Objective:   There were no vitals  taken for this visit.    Assessment & Plan:  Diagnoses and all orders for this visit:  Subacute maxillary sinusitis -     amoxicillin-clavulanate (AUGMENTIN) 875-125 MG tablet; Take 1 tablet by mouth 2 (two) times daily. -     fluconazole (DIFLUCAN) 150 MG tablet; Take 1 tablet (150 mg total) by mouth daily.  I discussed the assessment and treatment plan with the patient. The patient was provided an opportunity to ask questions and all were answered. The patient agreed with the plan and demonstrated an understanding of the instructions.   The patient was advised to call back or seek an in-person evaluation if the symptoms worsen or if the condition fails to improve as anticipated. I provided 18 minutes total of non-face-to-face time during this encounter including median intraservice time, reviewing previous notes, investigations, ordering medications, medical decision making, coordinating care and patient verbalized understanding at the end of the visit.    This note has been created with Surveyor, quantity. Any transcriptional errors are unintentional.   Kerin Perna, NP 01/18/2022, 3:47 PM

## 2022-01-21 ENCOUNTER — Ambulatory Visit (INDEPENDENT_AMBULATORY_CARE_PROVIDER_SITE_OTHER): Payer: Medicaid Other | Admitting: Primary Care

## 2022-01-29 ENCOUNTER — Telehealth: Payer: Medicaid Other | Admitting: Physician Assistant

## 2022-01-29 ENCOUNTER — Ambulatory Visit (INDEPENDENT_AMBULATORY_CARE_PROVIDER_SITE_OTHER): Payer: Self-pay

## 2022-01-29 DIAGNOSIS — F411 Generalized anxiety disorder: Secondary | ICD-10-CM

## 2022-01-29 MED ORDER — TRAZODONE HCL 50 MG PO TABS: 50.0000 mg | ORAL_TABLET | Freq: Every evening | ORAL | 1 refills | Status: DC | PRN

## 2022-01-29 MED ORDER — BUPROPION HCL ER (XL) 150 MG PO TB24: 150.0000 mg | ORAL_TABLET | Freq: Every day | ORAL | 0 refills | Status: DC

## 2022-01-29 NOTE — Telephone Encounter (Signed)
Chief Complaint: anxiety Symptoms: unable to fouc or concentrate, unable to work (left work due to [employer] today), racing thoughts, feels uneasy Frequency: 3 weeks Pertinent Negatives: Patient denies suicidal ideation Disposition: '[]'$ ED /'[]'$ Urgent Care (no appt availability in office) / '[x]'$ Appointment(In office/virtual)/ '[]'$  [facility]/ '[]'$ Home Care/ '[]'$ Refused Recommended Disposition /'[]'$ [facility]/ '[]'$  Follow-up with PCP Additional Notes: No available appts with PCP until July. Virtual UC Visit scheduled for 4 pm. Pt has a therapist named Dr. Bishop Limbo at [facility]: ([facility] (she is out on maternity leave)- advised pt to call [facility] to try and get appt with a therapist during the interim. Advised to use ice or hold ice in her hands, journal, take a walk outside. Advised pt to not stop medication without talking to her PCP first. Pt verbalized understanding.  Reason for Disposition  [1] Symptoms of anxiety or panic attack AND [2] is a chronic symptom (recurrent or ongoing AND present > 4 weeks)  Answer Assessment - Initial Assessment Questions 1. CONCERN: "Did anything happen that prompted you to call today?"      Missed appt and forgot to rescheduled it panic attacks worse- stopped medication 2. ANXIETY SYMPTOMS: "Can you describe how you (your loved one; patient) have been feeling?" (e.g., tense, restless, panicky, anxious, keyed up, overwhelmed, sense of impending doom).      Anxious, uneasy, cant focus, racing thoughts 3. ONSET: "How long have you been feeling this way?" (e.g., hours, days, weeks)     3 weeks 4. SEVERITY: "How would you rate the level of anxiety?" (e.g., 0 - 10; or mild, moderate, severe).     mild 5. FUNCTIONAL IMPAIRMENT: "How have these feelings affected your ability to do daily activities?" "Have you had more difficulty than usual doing your normal daily activities?" (e.g., getting better, same, worse;  self-care, school, work, interactions)     Yes- yes- feels fear, nervous- worse 6. HISTORY: "Have you felt this way before?" "Have you ever been diagnosed with an anxiety problem in the past?" (e.g., generalized anxiety disorder, panic attacks, PTSD). If Yes, ask: "How was this problem treated?" (e.g., medicines, counseling, etc.)     Yes- yes-medicines xanax 7. RISK OF HARM - SUICIDAL IDEATION: "Do you ever have thoughts of hurting or killing yourself?" If Yes, ask:  "Do you have these feelings now?" "Do you have a plan on how you would do this?"     no 8. TREATMENT:  "What has been done so far to treat this anxiety?" (e.g., medicines, relaxation strategies). "What has helped?"     Buspar 9. TREATMENT - THERAPIST: "Do you have a counselor or therapist? Name?"     Yes on maternity leave 10. POTENTIAL TRIGGERS: "Do you drink caffeinated beverages (e.g., coffee, colas, teas), and how much daily?" "Do you drink alcohol or use any drugs?" "Have you started any new medicines recently?"     No- wine 2 per day-no- Buspar 10. PATIENT SUPPORT: "Who is with you now?" "Who do you live with?" "Do you have family or friends who you can talk to?"        Son- son- yes 84. OTHER SYMPTOMS: "Do you have any other symptoms?" (e.g., feeling depressed, trouble concentrating, trouble sleeping, trouble breathing, palpitations or fast heartbeat, chest pain, sweating, nausea, or diarrhea)       Depressed, trouble concentration, trouble sleeping gets 5 hours with Trazodone, sweating, palpitations 12. PREGNANCY: "Is there any chance you are pregnant?" "  When was your last menstrual period?"       No LMP: last week  Protocols used: Anxiety and Panic Attack-A-AH

## 2022-01-29 NOTE — Progress Notes (Signed)
Virtual Visit Consent   Paula Giles, you are scheduled for a virtual visit with a [facility] provider today. Just as with appointments in the office, your consent must be obtained to participate. Your consent will be active for this visit and any virtual visit you may have with one of our providers in the next 365 days. If you have a MyChart account, a copy of this consent can be sent to you electronically.  As this is a virtual visit, video technology does not allow for your provider to perform a traditional examination. This may limit your provider's ability to fully assess your condition. If your provider identifies any concerns that need to be evaluated in person or the need to arrange testing (such as labs, EKG, etc.), we will make arrangements to do so. Although advances in technology are sophisticated, we cannot ensure that it will always work on either your end or our end. If the connection with a video visit is poor, the visit may have to be switched to a telephone visit. With either a video or telephone visit, we are not always able to ensure that we have a secure connection.  By engaging in this virtual visit, you consent to the provision of healthcare and authorize for your insurance to be billed (if applicable) for the services provided during this visit. Depending on your insurance coverage, you may receive a charge related to this service.  I need to obtain your verbal consent now. Are you willing to proceed with your visit today? Paula Giles has provided verbal consent on 01/29/2022 for a virtual visit (video or telephone). Mar Daring, PA-C  Date: 01/29/2022 5:05 PM  Virtual Visit via Video Note   I, Mar Daring, connected with  Paula Giles  ([id number], 07/29/1986) on 01/29/22 at  4:00 PM EDT by a video-enabled telemedicine application and verified that I am speaking with the correct person using two identifiers.  Location: Patient: Virtual Visit Location  Patient: Home Provider: Virtual Visit Location Provider: Home Office   I discussed the limitations of evaluation and management by telemedicine and the availability of in person appointments. The patient expressed understanding and agreed to proceed.    History of Present Illness: Paula Giles is a 36 y.o. who identifies as a female who was assigned female at birth, and is being seen today for worsening anxiety. Stopped Buspirone 2 months ago. Recently started on Trazodone 25-'50mg'$  nightly for sleep. Admits to not taking regularly. Has been given hydroxyzine for acute anxiety, does not take often. Does have a Therapist, Dr. Bishop [facility] at [facility]. Her therapist is out on maternity leave at this time. No known trigger, just constant everyday stress. She does admit to frequent, every day drinking of wine. She reports she has tried multiple SSRI and SNRI medications with intolerance or side effects to all of them. Has started taking Magnesium glycinate to try to treat naturally. Denies SI/HI.    Problems:  Patient Active Problem List   Diagnosis Date Noted   Elevated LFTs 11/19/2020   Vaginal discharge 11/19/2020   Dysuria 11/19/2020   Hypertension 08/09/2020   Normocytic anemia 08/09/2020   Pre-diabetes 08/09/2020   Obesity 08/09/2020   Hematuria 08/09/2020   Generalized anxiety disorder with panic attacks 08/09/2020   MDD (major depressive disorder) 08/09/2020   Abnormal uterine bleeding 08/09/2020    Allergies:  Allergies  Allergen Reactions   Latex Rash   Medications:  Current  Outpatient Medications:    buPROPion (WELLBUTRIN XL) 150 MG 24 hr tablet, Take 1 tablet (150 mg total) by mouth daily., Disp: 90 tablet, Rfl: 0   amoxicillin-clavulanate (AUGMENTIN) 875-125 MG tablet, Take 1 tablet by mouth 2 (two) times daily., Disp: 20 tablet, Rfl: 0   fluconazole (DIFLUCAN) 150 MG tablet, Take 1 tablet (150 mg total) by mouth daily., Disp: 1 tablet,  Rfl: 1   omeprazole (PRILOSEC) 20 MG capsule, Take 1 capsule (20 mg total) by mouth daily., Disp: 90 capsule, Rfl: 0   traZODone (DESYREL) 50 MG tablet, Take 1-2 tablets (50-100 mg total) by mouth at bedtime as needed. for sleep, Disp: 90 tablet, Rfl: 1   triamcinolone ointment (KENALOG) 0.5 %, Apply 1 application topically 2 (two) times daily., Disp: 45 g, Rfl: 1   valsartan-hydrochlorothiazide (DIOVAN-HCT) 160-25 MG tablet, Take 1 tablet by mouth daily., Disp: 90 tablet, Rfl: 3  Observations/Objective: Patient is well-developed, well-nourished in no acute distress.  Resting comfortably at home.  Head is normocephalic, atraumatic.  No labored breathing.  Speech is clear and coherent with logical content.  Patient is alert and oriented at baseline.    Assessment and Plan: 1. GAD (generalized anxiety disorder) - buPROPion (WELLBUTRIN XL) 150 MG 24 hr tablet; Take 1 tablet (150 mg total) by mouth daily.  Dispense: 90 tablet; Refill: 0 - traZODone (DESYREL) 50 MG tablet; Take 1-2 tablets (50-100 mg total) by mouth at bedtime as needed. for sleep  Dispense: 90 tablet; Refill: 1  - Worsening symptoms - Multiple drug intolerances to SSRI/SNRI reported - Will try Wellbutrin as above - Advised safe to take Trazodone 50-'100mg'$  nightly to help sleep - Advised may use Hydroxyzine for as needed panic attacks, not just sleep (did discuss drowsiness precautions) - Long discussion held on decreasing alcohol consumption as this can be a trigger for anxiety the next morning/day following a night of drinking. Also discussed how alcohol consumption can also cause sleep issues. - Discussed increasing physical activity  - Advised to follow up with PCP and therapist in next 4 weeks for continued monitoring and encouraging healthier behaviors - Seek emergent evaluation if symptoms worsen acutely of suicidal/homicidal ideations arise  Follow Up Instructions: I discussed the assessment and treatment plan with  the patient. The patient was provided an opportunity to ask questions and all were answered. The patient agreed with the plan and demonstrated an understanding of the instructions.  A copy of instructions were sent to the patient via MyChart unless otherwise noted below.    The patient was advised to call back or seek an in-person evaluation if the symptoms worsen or if the condition fails to improve as anticipated.  Time:  I spent 40 minutes with the patient via telehealth technology discussing the above problems/concerns.    Mar Daring, PA-C

## 2022-01-29 NOTE — Patient Instructions (Addendum)
Paula Giles, thank you for joining Mar Daring, PA-C for today's virtual visit.  While this provider is not your primary care provider (PCP), if your PCP is located in our provider database this encounter information will be shared with them immediately following your visit.  Consent: (Patient) Paula Giles provided verbal consent for this virtual visit at the beginning of the encounter.  Current Medications:  Current Outpatient Medications:    buPROPion (WELLBUTRIN XL) 150 MG 24 hr tablet, Take 1 tablet (150 mg total) by mouth daily., Disp: 90 tablet, Rfl: 0   amoxicillin-clavulanate (AUGMENTIN) 875-125 MG tablet, Take 1 tablet by mouth 2 (two) times daily., Disp: 20 tablet, Rfl: 0   fluconazole (DIFLUCAN) 150 MG tablet, Take 1 tablet (150 mg total) by mouth daily., Disp: 1 tablet, Rfl: 1   omeprazole (PRILOSEC) 20 MG capsule, Take 1 capsule (20 mg total) by mouth daily., Disp: 90 capsule, Rfl: 0   traZODone (DESYREL) 50 MG tablet, Take 1-2 tablets (50-100 mg total) by mouth at bedtime as needed. for sleep, Disp: 90 tablet, Rfl: 1   triamcinolone ointment (KENALOG) 0.5 %, Apply 1 application topically 2 (two) times daily., Disp: 45 g, Rfl: 1   valsartan-hydrochlorothiazide (DIOVAN-HCT) 160-25 MG tablet, Take 1 tablet by mouth daily., Disp: 90 tablet, Rfl: 3   Medications ordered in this encounter:  Meds ordered this encounter  Medications   buPROPion (WELLBUTRIN XL) 150 MG 24 hr tablet    Sig: Take 1 tablet (150 mg total) by mouth daily.    Dispense:  90 tablet    Refill:  0    Order Specific Question:   Supervising Provider    Answer:   Sabra Heck, BRIAN [[id number]]   traZODone (DESYREL) 50 MG tablet    Sig: Take 1-2 tablets (50-100 mg total) by mouth at bedtime as needed. for sleep    Dispense:  90 tablet    Refill:  1    Order Specific Question:   Supervising Provider    Answer:   Sabra Heck, [place]     *If you need refills on other medications prior to your next  appointment, please contact your pharmacy*  Follow-Up: Call back or seek an in-person evaluation if the symptoms worsen or if the condition fails to improve as anticipated.  Other Instructions  Managing Anxiety, Adult After being diagnosed with anxiety, you may be relieved to know why you have felt or behaved a certain way. You may also feel overwhelmed about the treatment ahead and what it will mean for your life. With care and support, you can manage this condition. How to manage lifestyle changes Managing stress and anxiety  Stress is your body's reaction to life changes and events, both good and bad. Most stress will last just a few hours, but stress can be ongoing and can lead to more than just stress. Although stress can play a major role in anxiety, it is not the same as anxiety. Stress is usually caused by something external, such as a deadline, test, or competition. Stress normally passes after the triggering event has ended.  Anxiety is caused by something internal, such as imagining a terrible outcome or worrying that something will go wrong that will devastate you. Anxiety often does not go away even after the triggering event is over, and it can become long-term (chronic) worry. It is important to understand the differences between stress and anxiety and to manage your stress effectively so that it does not lead to an  anxious response. Talk with your health care provider or a counselor to learn more about reducing anxiety and stress. He or she may suggest tension reduction techniques, such as: Music therapy. Spend time creating or listening to music that you enjoy and that inspires you. Mindfulness-based meditation. Practice being aware of your normal breaths while not trying to control your breathing. It can be done while sitting or walking. Centering prayer. This involves focusing on a word, phrase, or sacred image that means something to you and brings you peace. Deep breathing. To  do this, expand your stomach and inhale slowly through your nose. Hold your breath for 3-5 seconds. Then exhale slowly, letting your stomach muscles relax. Self-talk. Learn to notice and identify thought patterns that lead to anxiety reactions and change those patterns to thoughts that feel peaceful. Muscle relaxation. Taking time to tense muscles and then relax them. Choose a tension reduction technique that fits your lifestyle and personality. These techniques take time and practice. Set aside 5-15 minutes a day to do them. Therapists can offer counseling and training in these techniques. The training to help with anxiety may be covered by some insurance plans. Other things you can do to manage stress and anxiety include: Keeping a stress diary. This can help you learn what triggers your reaction and then learn ways to manage your response. Thinking about how you react to certain situations. You may not be able to control everything, but you can control your response. Making time for activities that help you relax and not feeling guilty about spending your time in this way. Doing visual imagery. This involves imagining or creating mental pictures to help you relax. Practicing yoga. Through yoga poses, you can lower tension and promote relaxation.  Medicines Medicines can help ease symptoms. Medicines for anxiety include: Antidepressant medicines. These are usually prescribed for long-term daily control. Anti-anxiety medicines. These may be added in severe cases, especially when panic attacks occur. Medicines will be prescribed by a health care provider. When used together, medicines, psychotherapy, and tension reduction techniques may be the most effective treatment. Relationships Relationships can play a big part in helping you recover. Try to spend more time connecting with trusted friends and family members. Consider going to couples counseling if you have a partner, taking family education  classes, or going to family therapy. Therapy can help you and others better understand your condition. How to recognize changes in your anxiety Everyone responds differently to treatment for anxiety. Recovery from anxiety happens when symptoms decrease and stop interfering with your daily activities at home or work. This may mean that you will start to: Have better concentration and focus. Worry will interfere less in your daily thinking. Sleep better. Be less irritable. Have more energy. Have improved memory. It is also important to recognize when your condition is getting worse. Contact your health care provider if your symptoms interfere with home or work and you feel like your condition is not improving. Follow these instructions at home: Activity Exercise. Adults should do the following: Exercise for at least 150 minutes each week. The exercise should increase your heart rate and make you sweat (moderate-intensity exercise). Strengthening exercises at least twice a week. Get the right amount and quality of sleep. Most adults need 7-9 hours of sleep each night. Lifestyle  Eat a healthy diet that includes plenty of vegetables, fruits, whole grains, low-fat dairy products, and lean protein. Do not eat a lot of foods that are high in fats, added sugars,  or salt (sodium). Make choices that simplify your life. Do not use any products that contain nicotine or tobacco. These products include cigarettes, chewing tobacco, and vaping devices, such as e-cigarettes. If you need help quitting, ask your health care provider. Avoid caffeine, alcohol, and certain over-the-counter cold medicines. These may make you feel worse. Ask your pharmacist which medicines to avoid. General instructions Take over-the-counter and prescription medicines only as told by your health care provider. Keep all follow-up visits. This is important. Where to find support You can get help and support from these  sources: Self-help groups. Online and [employer]. A trusted spiritual leader. Couples counseling. Family education classes. Family therapy. Where to find more information You may find that joining a support group helps you deal with your anxiety. The following sources can help you locate counselors or support groups near you: [place]: www.mentalhealthamerica.net Anxiety and Depression Association of [place] (ADAA): https://www.clark.net/ National Alliance on Mental Illness (NAMI): www.nami.org Contact a health care provider if: You have a hard time staying focused or finishing daily tasks. You spend many hours a day feeling worried about everyday life. You become exhausted by worry. You start to have headaches or frequently feel tense. You develop chronic nausea or diarrhea. Get help right away if: You have a racing heart and shortness of breath. You have thoughts of hurting yourself or others. If you ever feel like you may hurt yourself or others, or have thoughts about taking your own life, get help right away. Go to your nearest emergency department or: Call your local emergency services (911 in the U.S.). Call a suicide crisis helpline, such as the [place] at 514-606-4920 or 988 in the [place]. This is open 24 hours a day in the U.S. Text the Crisis Text Line at (279) 887-5902 (in the [place].). Summary Taking steps to learn and use tension reduction techniques can help calm you and help prevent triggering an anxiety reaction. When used together, medicines, psychotherapy, and tension reduction techniques may be the most effective treatment. Family, friends, and partners can play a big part in supporting you. This information is not intended to replace advice given to you by your health care provider. Make sure you discuss any questions you have with your health care provider. Document Revised: 02/13/2021 Document Reviewed: 11/11/2020 Elsevier Patient  Education  [place].  Alcohol Abuse and Dependence Information, Adult Alcohol is a widely available drug. People drink alcohol in different amounts. People who drink alcohol very often and in large amounts often have problems during and after drinking. They may develop what is called an alcohol use disorder. There are two main types of alcohol use disorders: Alcohol abuse. This is when you use alcohol too much or too often. You may use alcohol to make yourself feel happy or to reduce stress. You may have a hard time setting a limit on the amount you drink. Alcohol dependence. This is when you use alcohol consistently for a period of time, and your body changes as a result. This can make it hard to stop drinking because you may start to feel sick or feel different when you do not use alcohol. These symptoms are known as withdrawal. How can alcohol abuse and dependence affect me? Alcohol abuse and dependence can have a negative effect on your life. Drinking too much can lead to addiction. You may feel like you need alcohol to function normally. You may drink alcohol before work in the morning, during the day,  or as soon as you get home from work in the evening. These actions can result in: Poor work performance. Job loss. Financial problems. Car crashes or criminal charges from driving after drinking alcohol. Problems in your relationships with friends and family. Losing the trust and respect of coworkers, friends, and family. Drinking heavily over a long period of time can permanently damage your body and brain, and can cause lifelong health issues, such as: Damage to your liver or pancreas. Heart problems, high blood pressure, or stroke. Certain cancers. Decreased ability to fight infections. Brain or nerve damage. Depression. Early (premature) death. If you are careless or you crave alcohol, it is easy to drink more than your body can handle (overdose). Alcohol overdose is a serious  situation that requires hospitalization. It may lead to permanent injuries or death. What can increase my risk? Having a family history of alcohol abuse. Having depression or other mental health conditions. Beginning to drink at an early age. Binge drinking often. Experiencing trauma, stress, and an unstable home life during childhood. Spending time with people who drink often. What actions can I take to prevent or manage alcohol abuse and dependence? Do not drink alcohol if: Your health care provider tells you not to drink. You are pregnant, may be pregnant, or are planning to become pregnant. If you drink alcohol: Limit how much you use to: 0-1 drink a day for women. 0-2 drinks a day for men. Be aware of how much alcohol is in your drink. In the U.S., one drink equals one 12 oz bottle of beer (355 mL), one 5 oz glass of wine (148 mL), or one 1 oz glass of hard liquor (44 mL). Stop drinking if you have been drinking too much. This can be very hard to do if you are used to abusing alcohol. If you begin to have withdrawal symptoms, talk with your health care provider or a person that you trust. These symptoms may include anxiety, shaky hands, headache, nausea, sweating, or not being able to sleep. Choose to drink nonalcoholic beverages in social gatherings and places where there may be alcohol. Activity Spend more time on activities that you enjoy that do not involve alcohol, like hobbies or exercise. Find healthy ways to cope with stress, such as exercise, meditation, or spending time with people you care about. General information Talk to your family, coworkers, and friends about supporting you in your efforts to stop drinking. If they drink, ask them not to drink around you. Spend more time with people who do not drink alcohol. If you think that you have an alcohol dependency problem: Tell friends or family about your concerns. Talk with your health care provider or another health  professional about where to get help. Work with a Transport planner and a Regulatory affairs officer. Consider joining a support group for people who struggle with alcohol abuse and dependence. Where to find support  Your health care provider. SMART Recovery: www.smartrecovery.org Therapy and support groups Local treatment centers or chemical dependency counselors. Local AA groups in your community: NicTax.com.pt Where to find more information Centers for Disease Control and Prevention: http://www.wolf.info/ National Institute on Alcohol Abuse and Alcoholism: http://www.bradshaw.com/ Alcoholics Anonymous (AA): NicTax.com.pt Contact a health care provider if: You drank more or for longer than you intended on more than one occasion. You tried to stop drinking or to cut back on how much you drink, but you were not able to. You often drink to the point of vomiting or passing out. You  want to drink so badly that you cannot think about anything else. You have problems in your life due to drinking, but you continue to drink. You keep drinking even though you feel anxious, depressed, or have experienced memory loss. You have stopped doing the things you used to enjoy in order to drink. You have to drink more than you used to in order to get the effect you want. You experience anxiety, sweating, nausea, shakiness, and trouble sleeping when you try to stop drinking. Get help right away if: You have thoughts about hurting yourself or others. You have serious withdrawal symptoms, including: Confusion. Racing heart. High blood pressure. Fever. If you ever feel like you may hurt yourself or others, or have thoughts about taking your own life, get help right away. You can go to your nearest emergency department or call: Your local emergency services (911 in the U.S.). A suicide crisis helpline, such as the [place] at (629)329-2828 or 988 in the [place]. This is open 24 hours a day. Summary Alcohol  abuse and dependence can have a negative effect on your life. Drinking too much or too often can lead to addiction. If you drink alcohol, limit how much you use. If you are having trouble keeping your drinking under control, find ways to change your behavior. Hobbies, calming activities, exercise, or support groups can help. If you feel you need help with changing your drinking habits, talk with your health care provider, a good friend, or a therapist, or go to an [place] group. This information is not intended to replace advice given to you by your health care provider. Make sure you discuss any questions you have with your health care provider. Document Revised: 06/14/2021 Document Reviewed: 09/28/2018 Elsevier Patient Education  [place] Training/Deep Breathing/Guided Meditations: - Consider downloading one of the following apps as they each deal with walking you through these techniques -- Calm, HeadSpace, [place] - There are also a lot of great books on these subjects. Here are a few good ones to consider:           * Mindfulness for Beginners by Lynn Ito           * Practicing Mindfulness by Maurie Boettcher           * Breath WORK by Gene Smithson   National Suicide Prevention Lifeline (367) 758-4854 657-236-9126) http://www.webster.biz/ "988"-Mental [place] 312 731 9644 nimhinfo'@nih'$ .gov (e-mail) https://carter.com/  Local Resources: Outpatient:   [facility]:  [phone] [place], [place]  [address]   https://richard.[email or web address]/  [facility] [place] [address] [place], [place] [address] [place] at [place]. [employer]. [place], [place]  [address] [phone] Local  Resources (Inpatient)  [facility] [place], [place] [address] [phone]  [facility] Medicine Unit and Geriatric Psychiatric Unit   [place], [place] [address] [place] TastyShow.cz  [place] https://namiguilford.org/  [employer] and Support Resources:  Partners Ending Homelessness PainGain.tn  Museum/gallery curator https://www.interactiveresourcecenter.org/  [employer] https://www.greensborourbanministry.org/  [facility] [employer] (adult [employer] shelter) www.opendoorministrieshp.org [address],  [address] [phone] [employer] [facility] [place] [address] [place], [place] [address] [phone]   [facility] [phone] VET [phone])  [facility] [phone] press 1 Confidential chat-VeteransCrisisLine.net Or Text to [place] Call ([phone] or [phone] www.DealerOdds.hu  Affordable Housing Resources in [place].com   [phone]  [place] [phone] 8:30am-5:30pm [place] [facility] [address] [place], [place]  [address] [phone] Female veterans 18+ with substance abuse issues Eligibility:  By Referral Only  [place], [place] [address] [phone] Ext. 79 Adult Men & Women Eligibility: Valid ID & Social Oncologist.greensborourbanministry.[place] [address] [place], [place]  [address] [phone] Female veterans 18+ with substance abuse issues Eligibility:  By Referral Only  [place] [address] [place], [place]  [address] [phone] Single women 18+ without dependents Open 6pm-8am Eligibility:  Valid [place] Card Call to check availability  http://westendministries.org/leslieshouse.aspx  [facility] [address] [place], [place]  [address] [phone] Female veterans 18+ with substance abuse/mental health issues Eligibility:  By Referral Only  [facility] [address] [place], [place] [address] [phone] Call to check availability Males 18+ Eligibility: Valid ID & Social Security Card www.odm-hp.[facility] [place], [place] [address] [phone] Women 18+ & Families with children Eligibility:  Valid ID & Criminal Background Check BattleCheck.dk     [facility] [place] (Care Management): [phone] [facility]: [place] 24 Hour Phone Line [place] [phone] [address] [place] [phone]  2-1-1 Referral Service [facility] 211 Call 211 or [phone] www.DealerOdds.hu A free [employer], 24/7 telephone information and referral service to help link citizens who are seeking help with the community resources they need. In [place], [place], [place] and [place] counties, just dial 211 from your phone.  Mental Health Association in [place] Support groups for anxiety, depression and bipolar disorder, schizophrenia, family and friends, aftermath of suicide, and mental wellness for Latinos (in [place]).  Mental Health Association in [place] Offers support groups, [employer] program offers. Psychological, vocational, educational and other rehabilitation services to those who suffer from mental health illness of the 10 and up (5 days a week/5hours a day), offer out patient services like diagnostic evaluation, comprehensive  clinical assessments, individual counseling, group therapy, psycho-educational workshops, referral to other specialists, referral to a psychiatrist for an evaluation for medication, consultation, and outreach/training.  ADS (Alcohol and Drug Services) (706)839-5205 [phone] Substance Abuse education, prevention and treatment (detox, assessments, intensive outpatient and inpatient counseling and programs).  [place] [phone], [place] [phone] Support groups for posttraumatic stress, depression, and schizophrenia? as well as day programs for individuals with severe mental illness.  [place] [phone]  [place]  [employer] (606)489-9929 or www.kellinfoundation.org Telehealth platform, Individual counseling across the lifespan for both mental health and substance use, support groups, advocacy, case management, virtual villages, resource coordination.  [phone]  [facility]-FREE-(201)[phone] or [phone]. Provides mental health services to all residents regardless of ability to pay. [address], [place]. 24  Domestic Violence Crisis Line Family Service of the [employer] for shelter and/or Electrical engineer [employer] [phone] (24/7) [place] ([phone] (24/7)  [place] [phone] press  1 Confidential chat-VeteransCrisisLine.net Or Text to [facility] [address] [place], [place] [address] [phone] Hours: Mon-Fri. 8am-5pm [facility] Mobile Crisis Management Mobile crisis response for mental health, substance abuse or intellectual/developmental disabilities [phone]  Disclaimer: This resource list is subject to change at any time and is a starting point for resource identification as of 08/02/2021.     Take  Care!    If you have been instructed to have an in-person evaluation today at a local Urgent Care facility, please use the link below. It will take you to a list of all of our available [place] [facility], including address, phone number and hours of operation. Please do not delay care.  [facility]  If you or a family member do not have a primary care provider, use the link below to schedule a visit and establish care. When you choose a [place] primary care physician or advanced practice provider, you gain a long-term partner in health. Find a Primary Care Provider  Learn more about [facility] in-office and virtual care options: [place] [facility]

## 2022-01-29 NOTE — Telephone Encounter (Signed)
Encounter routed to PCP

## 2022-02-11 NOTE — Telephone Encounter (Signed)
Patient does need to f/u with therapists in the interim . If any thoughts other than racing of harming self, plan or others send to behavioral health

## 2022-02-12 ENCOUNTER — Other Ambulatory Visit: Payer: Self-pay | Admitting: Family Medicine

## 2022-02-12 NOTE — Telephone Encounter (Signed)
Attempted to reach patient. Unable to leave message as voicemail is full.

## 2022-02-14 NOTE — Telephone Encounter (Signed)
Attempted to reach patient. Unable to leave message as voicemail is full.

## 2022-02-27 ENCOUNTER — Telehealth: Payer: Medicaid Other | Admitting: Physician Assistant

## 2022-02-27 ENCOUNTER — Telehealth (INDEPENDENT_AMBULATORY_CARE_PROVIDER_SITE_OTHER): Payer: Self-pay | Admitting: Primary Care

## 2022-02-27 ENCOUNTER — Ambulatory Visit (INDEPENDENT_AMBULATORY_CARE_PROVIDER_SITE_OTHER): Payer: Self-pay

## 2022-02-27 DIAGNOSIS — Z202 Contact with and (suspected) exposure to infections with a predominantly sexual mode of transmission: Secondary | ICD-10-CM

## 2022-02-27 NOTE — Telephone Encounter (Signed)
Can be she be treated as partner has tested positive?  She is a patient of [facility]

## 2022-02-27 NOTE — Progress Notes (Signed)
Virtual Visit Consent   Paula Giles, you are scheduled for a virtual visit with a [facility] provider today. Just as with appointments in the office, your consent must be obtained to participate. Your consent will be active for this visit and any virtual visit you may have with one of our providers in the next 365 days. If you have a MyChart account, a copy of this consent can be sent to you electronically.  As this is a virtual visit, video technology does not allow for your provider to perform a traditional examination. This may limit your provider's ability to fully assess your condition. If your provider identifies any concerns that need to be evaluated in person or the need to arrange testing (such as labs, EKG, etc.), we will make arrangements to do so. Although advances in technology are sophisticated, we cannot ensure that it will always work on either your end or our end. If the connection with a video visit is poor, the visit may have to be switched to a telephone visit. With either a video or telephone visit, we are not always able to ensure that we have a secure connection.  By engaging in this virtual visit, you consent to the provision of healthcare and authorize for your insurance to be billed (if applicable) for the services provided during this visit. Depending on your insurance coverage, you may receive a charge related to this service.  I need to obtain your verbal consent now. Are you willing to proceed with your visit today? CHAUNTAE HULTS has provided verbal consent on 02/27/2022 for a virtual visit (video or telephone). Paula Giles, [place]  Date: 02/27/2022 1:12 PM  Virtual Visit via Video Note   I, Paula Giles, connected with  DALYA MASELLI  ([id number], 11-Jan-1986) on 02/27/22 at  1:00 PM EDT by a video-enabled telemedicine application and verified that I am speaking with the correct person using two identifiers.  Location: Patient: Virtual Visit Location  Patient: Home Provider: Virtual Visit Location Provider: Home Office   I discussed the limitations of evaluation and management by telemedicine and the availability of in person appointments. The patient expressed understanding and agreed to proceed.    History of Present Illness: Paula Giles is a 36 y.o. who identifies as a female who was assigned female at birth, and is being seen today for possible STI. Notes around 02/12/2022 had sexual intercourse with a new partner. Started having significant vaginal pain a few days later. Notes partner was having similar symptoms so was tested and diagnosed with chlamydia. Unsure of any other diagnosis. She called PCP office and triage nurse set her up for video appointment.    HPI: HPI  Problems:  Patient Active Problem List   Diagnosis Date Noted   Elevated LFTs 11/19/2020   Vaginal discharge 11/19/2020   Dysuria 11/19/2020   Hypertension 08/09/2020   Normocytic anemia 08/09/2020   Pre-diabetes 08/09/2020   Obesity 08/09/2020   Hematuria 08/09/2020   Generalized anxiety disorder with panic attacks 08/09/2020   MDD (major depressive disorder) 08/09/2020   Abnormal uterine bleeding 08/09/2020    Allergies:  Allergies  Allergen Reactions   Latex Rash   Medications:  Current Outpatient Medications:    amoxicillin-clavulanate (AUGMENTIN) 875-125 MG tablet, Take 1 tablet by mouth 2 (two) times daily., Disp: 20 tablet, Rfl: 0   buPROPion (WELLBUTRIN XL) 150 MG 24 hr tablet, Take 1 tablet (150 mg total) by mouth daily., Disp: 90 tablet, Rfl: 0  fluconazole (DIFLUCAN) 150 MG tablet, Take 1 tablet (150 mg total) by mouth daily., Disp: 1 tablet, Rfl: 1   omeprazole (PRILOSEC) 20 MG capsule, TAKE 1 CAPSULE BY MOUTH EVERY DAY, Disp: 90 capsule, Rfl: 0   traZODone (DESYREL) 50 MG tablet, Take 1-2 tablets (50-100 mg total) by mouth at bedtime as needed. for sleep, Disp: 90 tablet, Rfl: 1   triamcinolone ointment (KENALOG) 0.5 %, Apply 1 application  topically 2 (two) times daily., Disp: 45 g, Rfl: 1   valsartan-hydrochlorothiazide (DIOVAN-HCT) 160-25 MG tablet, Take 1 tablet by mouth daily., Disp: 90 tablet, Rfl: 3  Observations/Objective: Patient is well-developed, well-nourished in no acute distress.  Resting comfortably at home.  Head is normocephalic, atraumatic.  No labored breathing. Speech is clear and coherent with logical content.  Patient is alert and oriented at baseline.   Assessment and Plan: 1. STD exposure  Known exposure. Active pelvic pain. Requires in person evaluation for pelvic exam to rule out any cover for PID and full STI testing along with start of treatment as not proper to treat via video without any further evaluation. Link given so she can schedule with one of our urgent cares.   Follow Up Instructions: I discussed the assessment and treatment plan with the patient. The patient was provided an opportunity to ask questions and all were answered. The patient agreed with the plan and demonstrated an understanding of the instructions.  A copy of instructions were sent to the patient via MyChart unless otherwise noted below.   The patient was advised to call back or seek an in-person evaluation if the symptoms worsen or if the condition fails to improve as anticipated.  Time:  I spent 10 minutes with the patient via telehealth technology discussing the above problems/concerns.    Paula Rio, PA-C

## 2022-02-27 NOTE — Telephone Encounter (Signed)
  Chief Complaint: STD exposure Symptoms: abdominal pain 7/10, vaginal discharge Frequency: last week when abdominal pain started Pertinent Negatives: Patient denies N/V, HA Disposition: '[]'$ ED /'[]'$ Urgent Care (no appt availability in office) / '[]'$ Appointment(In office/virtual)/ '[x]'$  [facility]/ '[]'$ Home Care/ '[]'$ Refused Recommended Disposition /'[]'$ [facility]/ '[]'$  Follow-up with PCP Additional Notes: pt states she had intercourse on the 12th and started experiencing symptoms around 17th when partner tested positive for chlamydia. Advised no of no appts available and could go to UC to get testing. Offered appts available today at [facility] but pt has to work and wants to wait until period ends before getting tested so scheduled pt for virtual UC visit for other symptoms at 1300 today.   Reason for Disposition  Sex partner of someone who was diagnosed with an STI  (Exception: Female exposed to bacterial vaginosis or vaginal yeast infection.)  Answer Assessment - Initial Assessment Questions 1. MAIN CONCERN: "What were you exposed to?"  "What sexually transmitted infection (STI) does your sex partner have?" (e.g., gonorrhea, herpes, HIV, pubic lice)     Chlamydia  2. ROUTE of EXPOSURE: "How were you exposed to the STI?" (e.g., oral, vaginal, or rectal intercourse)     Sexual  3. DATE of EXPOSURE: "When did the exposure occur?" (e.g., days)     02/17/22 tested positive, exposed on 12th 4. SYMPTOMS: "Do you have any symptoms?" (e.g., pain with urination, rash, sores)     Abdominal pain 7/10, comes and goes, discharge vaginally clear  Protocols used: STI Exposure-A-AH

## 2022-02-27 NOTE — Patient Instructions (Signed)
  Paula Giles, thank you for joining [name], PA-C for today's virtual visit.  While this provider is not your primary care provider (PCP), if your PCP is located in our provider database this encounter information will be shared with them immediately following your visit.  Consent: (Patient) Paula Giles provided verbal consent for this virtual visit at the beginning of the encounter.  Current Medications:  Current Outpatient Medications:    amoxicillin-clavulanate (AUGMENTIN) 875-125 MG tablet, Take 1 tablet by mouth 2 (two) times daily., Disp: 20 tablet, Rfl: 0   buPROPion (WELLBUTRIN XL) 150 MG 24 hr tablet, Take 1 tablet (150 mg total) by mouth daily., Disp: 90 tablet, Rfl: 0   fluconazole (DIFLUCAN) 150 MG tablet, Take 1 tablet (150 mg total) by mouth daily., Disp: 1 tablet, Rfl: 1   omeprazole (PRILOSEC) 20 MG capsule, TAKE 1 CAPSULE BY MOUTH EVERY DAY, Disp: 90 capsule, Rfl: 0   traZODone (DESYREL) 50 MG tablet, Take 1-2 tablets (50-100 mg total) by mouth at bedtime as needed. for sleep, Disp: 90 tablet, Rfl: 1   triamcinolone ointment (KENALOG) 0.5 %, Apply 1 application topically 2 (two) times daily., Disp: 45 g, Rfl: 1   valsartan-hydrochlorothiazide (DIOVAN-HCT) 160-25 MG tablet, Take 1 tablet by mouth daily., Disp: 90 tablet, Rfl: 3   Medications ordered in this encounter:  No orders of the defined types were placed in this encounter.    *If you need refills on other medications prior to your next appointment, please contact your pharmacy*  Follow-Up: Call back or seek an in-person evaluation if the symptoms worsen or if the condition fails to improve as anticipated.  Other Instructions Please use link below to get scheduled at one of our in-person urgent cares for evaluation ASAP. Do not delay care.    If you have been instructed to have an in-person evaluation today at a local Urgent Care facility, please use the link below. It will take you to a list of all  of our available [facility], including address, phone number and hours of operation. Please do not delay care.  [facility]  If you or a family member do not have a primary care provider, use the link below to schedule a visit and establish care. When you choose a [facility] primary care physician or advanced practice provider, you gain a long-term partner in health. Find a Primary Care Provider  Learn more about [facility]'s in-office and virtual care options: [facility]

## 2022-02-28 ENCOUNTER — Other Ambulatory Visit (INDEPENDENT_AMBULATORY_CARE_PROVIDER_SITE_OTHER): Payer: Self-pay | Admitting: Primary Care

## 2022-02-28 MED ORDER — DOXYCYCLINE HYCLATE 100 MG PO TABS: 100.0000 mg | ORAL_TABLET | Freq: Two times a day (BID) | ORAL | 0 refills | Status: DC

## 2022-02-28 NOTE — Telephone Encounter (Signed)
Attempted to reach patient to inform. Voicemail is full.

## 2022-03-03 ENCOUNTER — Encounter (HOSPITAL_COMMUNITY): Payer: Self-pay

## 2022-03-03 ENCOUNTER — Ambulatory Visit (HOSPITAL_COMMUNITY)
Admission: EM | Admit: 2022-03-03 | Discharge: 2022-03-03 | Disposition: A | Discharge status: Home / Self Care | Payer: Medicaid Other | Attending: Family Medicine | Admitting: Family Medicine

## 2022-03-03 DIAGNOSIS — R102 Pelvic and perineal pain: Secondary | ICD-10-CM | POA: Diagnosis present

## 2022-03-03 DIAGNOSIS — Z202 Contact with and (suspected) exposure to infections with a predominantly sexual mode of transmission: Secondary | ICD-10-CM

## 2022-03-03 LAB — POC URINE PREG, ED: Preg Test, Ur: NEGATIVE

## 2022-03-03 MED ORDER — DOXYCYCLINE HYCLATE 100 MG PO CAPS: 100.0000 mg | ORAL_CAPSULE | Freq: Two times a day (BID) | ORAL | 0 refills | Status: AC

## 2022-03-03 NOTE — ED Triage Notes (Signed)
Pt reports exposure to STD. Pt reports abdominal pain x 3 days.

## 2022-03-03 NOTE — ED Provider Notes (Signed)
[facility]    CSN: [id number] Arrival date & time: 03/03/22  1711      History   Chief Complaint Chief Complaint  Patient presents with   Abdominal Pain   Exposure to STD    HPI Paula Giles is a 36 y.o. female.    Abdominal Pain Exposure to STD Associated symptoms include abdominal pain.   Here for abdominal cramping that is been going on 2 to 3 days.  Her period also just started.  She was told by a recent sexual partner that he had chlamydia.  When she had intercourse with him the condom did break.  Intercourse had occurred about 10 days ago.  No fever or chills or dysuria.    Past Medical History:  Diagnosis Date   Anxiety    BV (bacterial vaginosis)    Irregular menstrual bleeding    Mood disorder (HCC)    Seasonal allergies     Patient Active Problem List   Diagnosis Date Noted   Elevated LFTs 11/19/2020   Vaginal discharge 11/19/2020   Dysuria 11/19/2020   Hypertension 08/09/2020   Normocytic anemia 08/09/2020   Pre-diabetes 08/09/2020   Obesity 08/09/2020   Hematuria 08/09/2020   Generalized anxiety disorder with panic attacks 08/09/2020   MDD (major depressive disorder) 08/09/2020   Abnormal uterine bleeding 08/09/2020    Past Surgical History:  Procedure Laterality Date   CESAREAN SECTION  2009   WISDOM TOOTH EXTRACTION      OB History     Gravida  1   Para  1   Term  1   Preterm      AB      Living         SAB      IAB      Ectopic      Multiple      Live Births               Home Medications    Prior to Admission medications   Medication Sig Start Date End Date Taking? Authorizing Provider  buPROPion (WELLBUTRIN XL) 150 MG 24 hr tablet Take 1 tablet (150 mg total) by mouth daily. 01/29/22   Mar Daring, PA-C  doxycycline (VIBRA-TABS) 100 MG tablet Take 1 tablet (100 mg total) by mouth 2 (two) times daily for 7 days. 02/28/22 03/07/22  Kerin Perna, NP  omeprazole (PRILOSEC) 20 MG  capsule TAKE 1 CAPSULE BY MOUTH EVERY DAY 02/17/22   Kerin Perna, NP  traZODone (DESYREL) 50 MG tablet Take 1-2 tablets (50-100 mg total) by mouth at bedtime as needed. for sleep 01/29/22   Mar Daring, PA-C  triamcinolone ointment (KENALOG) 0.5 % Apply 1 application topically 2 (two) times daily. 07/02/21   Kerin Perna, NP  valsartan-hydrochlorothiazide (DIOVAN-HCT) 160-25 MG tablet Take 1 tablet by mouth daily. 07/02/21   Kerin Perna, NP    Family History History reviewed. No pertinent family history.  Social History Social History   Tobacco Use   Smoking status: Never   Smokeless tobacco: Never  Vaping Use   Vaping Use: Never used  Substance Use Topics   Alcohol use: Yes    Comment: socially   Drug use: No     Allergies   Latex   Review of Systems Review of Systems  Gastrointestinal:  Positive for abdominal pain.     Physical Exam Triage Vital Signs ED Triage Vitals [03/03/22 1830]  Enc Vitals Group  BP (!) 174/106     Pulse Rate 75     Resp 18     Temp 98.3 F (36.8 C)     Temp Source Oral     SpO2 97 %     Weight      Height      Head Circumference      Peak Flow      Pain Score      Pain Loc      Pain Edu?      Excl. in [place]?    No data found.  Updated Vital Signs BP (!) 174/106 (BP Location: Left Arm)   Pulse 75   Temp 98.3 F (36.8 C) (Oral)   Resp 18   SpO2 97%   Visual Acuity Right Eye Distance:   Left Eye Distance:   Bilateral Distance:    Right Eye Near:   Left Eye Near:    Bilateral Near:     Physical Exam Vitals reviewed.  Constitutional:      General: She is not in acute distress.    Appearance: She is not ill-appearing, toxic-appearing or diaphoretic.  Cardiovascular:     Rate and Rhythm: Normal rate and regular rhythm.     Heart sounds: No murmur heard. Pulmonary:     Effort: Pulmonary effort is normal.     Breath sounds: Normal breath sounds.  Abdominal:     General: There is no  distension.     Palpations: Abdomen is soft.     Tenderness: There is abdominal tenderness (Bilateral lower quadrants.). There is no guarding.  Skin:    Coloration: Skin is not jaundiced or pale.  Neurological:     General: No focal deficit present.     Mental Status: She is alert.  Psychiatric:        Behavior: Behavior normal.      UC Treatments / Results  Labs (all labs ordered are listed, but only abnormal results are displayed) Labs Reviewed  POC URINE PREG, ED  CERVICOVAGINAL ANCILLARY ONLY    EKG   Radiology No results found.  Procedures Procedures (including critical care time)  Medications Ordered in UC Medications - No data to display  Initial Impression / Assessment and Plan / UC Course  I have reviewed the triage vital signs and the nursing notes.  Pertinent labs & imaging results that were available during my care of the patient were reviewed by me and considered in my medical decision making (see chart for details).    Pregnancy test is negative  She chooses to be empirically treated with Doxy while she waits on the results of the swab. Final Clinical Impressions(s) / UC Diagnoses   Final diagnoses:  STD exposure  Pelvic pain     Discharge Instructions      The pregnancy test was negative  Staff will notify you of any positives on the swab  Take doxycycline 100 mg --1 capsule 2 times daily for 7 days       ED Prescriptions   None    PDMP not reviewed this encounter.   Barrett Henle, MD 03/03/22 1911

## 2022-03-03 NOTE — Discharge Instructions (Addendum)
The pregnancy test was negative  Staff will notify you of any positives on the swab  Take doxycycline 100 mg --1 capsule 2 times daily for 7 days

## 2022-03-03 NOTE — Telephone Encounter (Signed)
Patient is aware that treatment has been sent.

## 2022-03-04 ENCOUNTER — Telehealth (HOSPITAL_COMMUNITY): Payer: Self-pay | Admitting: Emergency Medicine

## 2022-03-04 LAB — CERVICOVAGINAL ANCILLARY ONLY
Bacterial Vaginitis (gardnerella): POSITIVE — AB
Candida Glabrata: NEGATIVE
Candida Vaginitis: NEGATIVE
Chlamydia: NEGATIVE
Comment: NEGATIVE
Comment: NEGATIVE
Comment: NEGATIVE
Comment: NEGATIVE
Comment: NEGATIVE
Comment: NORMAL
Neisseria Gonorrhea: NEGATIVE
Trichomonas: NEGATIVE

## 2022-03-04 MED ORDER — METRONIDAZOLE 0.75 % VA GEL: 1.0000 | Freq: Every day | VAGINAL | 0 refills | Status: AC

## 2022-03-13 ENCOUNTER — Encounter (INDEPENDENT_AMBULATORY_CARE_PROVIDER_SITE_OTHER): Payer: Medicaid Other | Admitting: Primary Care

## 2022-03-20 ENCOUNTER — Encounter (INDEPENDENT_AMBULATORY_CARE_PROVIDER_SITE_OTHER): Payer: Medicaid Other | Admitting: Primary Care

## 2022-03-27 ENCOUNTER — Encounter (INDEPENDENT_AMBULATORY_CARE_PROVIDER_SITE_OTHER): Payer: Medicaid Other | Admitting: Primary Care

## 2022-03-31 ENCOUNTER — Encounter (INDEPENDENT_AMBULATORY_CARE_PROVIDER_SITE_OTHER): Payer: Medicaid Other | Admitting: Primary Care

## 2022-04-10 ENCOUNTER — Encounter (INDEPENDENT_AMBULATORY_CARE_PROVIDER_SITE_OTHER): Payer: Medicaid Other | Admitting: Primary Care

## 2022-04-16 ENCOUNTER — Encounter (INDEPENDENT_AMBULATORY_CARE_PROVIDER_SITE_OTHER): Payer: Medicaid Other | Admitting: Primary Care

## 2022-05-15 ENCOUNTER — Ambulatory Visit (INDEPENDENT_AMBULATORY_CARE_PROVIDER_SITE_OTHER): Payer: Medicaid Other | Admitting: Primary Care

## 2022-05-15 ENCOUNTER — Encounter (INDEPENDENT_AMBULATORY_CARE_PROVIDER_SITE_OTHER): Payer: Self-pay | Admitting: Primary Care

## 2022-05-15 VITALS — BP 139/85 | HR 108 | Temp 98.2°F | Ht 64.0 in | Wt 273.0 lb

## 2022-05-15 DIAGNOSIS — F4323 Adjustment disorder with mixed anxiety and depressed mood: Secondary | ICD-10-CM

## 2022-05-15 DIAGNOSIS — I10 Essential (primary) hypertension: Secondary | ICD-10-CM | POA: Diagnosis not present

## 2022-05-15 DIAGNOSIS — R5383 Other fatigue: Secondary | ICD-10-CM

## 2022-05-15 DIAGNOSIS — Z833 Family history of diabetes mellitus: Secondary | ICD-10-CM

## 2022-05-15 DIAGNOSIS — N939 Abnormal uterine and vaginal bleeding, unspecified: Secondary | ICD-10-CM

## 2022-05-15 DIAGNOSIS — F411 Generalized anxiety disorder: Secondary | ICD-10-CM | POA: Diagnosis not present

## 2022-05-15 MED ORDER — VALSARTAN-HYDROCHLOROTHIAZIDE 160-25 MG PO TABS: 1.0000 | ORAL_TABLET | Freq: Every day | ORAL | 3 refills | Status: DC

## 2022-05-15 NOTE — Progress Notes (Signed)
Reason: depression med refill Pain no Safe yes  Falls no   273 5'4

## 2022-05-15 NOTE — Patient Instructions (Signed)
Depression and Anxiety You will learn about mood disorders, how these conditions can occur in people with heart disease, and why it is important to treat these conditions. To view the content, go to this web address: https://pe.elsevier.[email or web address]  This video will expire on: 01/25/2024. If you need access to this video following this date, please reach out to the healthcare provider who assigned it to you. This information is not intended to replace advice given to you by your health care provider. Make sure you discuss any questions you have with your health care provider. Elsevier Patient Education  Paula Giles.

## 2022-05-15 NOTE — Progress Notes (Signed)
Paula  Samona Giles, is a 36 y.o. female  SKA:[id number]  OMB:[id number]  DOB - 01-19-1986  Chief Complaint  Patient presents with   Depression   Medication Refill       Subjective:   [name] is a 36 y.o. female here today for a follow up visit for hypertension. Patient has No headache, No chest pain, No abdominal pain - No Nausea, No new weakness tingling or numbness, No Cough - shortness of breath.  She also have noticed more recently and reoccurring anxiety, heart palpitations, respiration, feeling like she is shaking inside, and feeling depressed for no reason.  Feeling fatigue, insomnia, irritability and not just herself.  She also has had a menstrual cycle now for 34 days will refer her to GYN for abnormal uterine bleeding.  Also note some of these mood changes and anxiety can be related to hormonal changes. No problems updated.  Allergies  Allergen Reactions   Latex Rash    Past Medical History:  Diagnosis Date   Anxiety    BV (bacterial vaginosis)    Irregular menstrual bleeding    Mood disorder (HCC)    Seasonal allergies     Current Outpatient Medications on File Prior to Visit  Medication Sig Dispense Refill   buPROPion (WELLBUTRIN XL) 150 MG 24 hr tablet Take 1 tablet (150 mg total) by mouth daily. (Patient not taking: Reported on 05/15/2022) 90 tablet 0   omeprazole (PRILOSEC) 20 MG capsule TAKE 1 CAPSULE BY MOUTH EVERY DAY (Patient not taking: Reported on 05/15/2022) 90 capsule 0   traZODone (DESYREL) 50 MG tablet Take 1-2 tablets (50-100 mg total) by mouth at bedtime as needed. for sleep (Patient not taking: Reported on 05/15/2022) 90 tablet 1   triamcinolone ointment (KENALOG) 0.5 % Apply 1 application topically 2 (two) times daily. (Patient not taking: Reported on 05/15/2022) 45 g 1   No current facility-administered medications on file prior to visit.    Objective:   Vitals:   05/15/22 1525  BP: 139/85  Pulse: (!) 108   Temp: 98.2 F (36.8 C)  SpO2: 97%  Weight: 273 lb (123.8 kg)  Height: 5' 4"  (1.626 m)    Exam General appearance : Awake, alert, not in any distress. Speech Clear. Not toxic looking HEENT: Atraumatic and Normocephalic, pupils equally reactive to light and accomodation Neck: Supple, no JVD. No cervical lymphadenopathy.  Chest: Good air entry bilaterally, no added sounds  CVS: S1 S2 regular, no murmurs.  Abdomen: Bowel sounds present, Non tender and not distended with no gaurding, rigidity or rebound. Extremities: B/L Lower Ext shows no edema, both legs are warm to touch Neurology: Awake alert, and oriented X 3, CN II-XII intact, Non focal Skin: No Rash  Data Review Lab Results  Component Value Date   HGBA1C 5.3 07/02/2021   HGBA1C 5.7 (A) 08/09/2020    Assessment & Plan  Mashell was seen today for depression and medication refill.  Diagnoses and all orders for this visit:  GAD (generalized anxiety disorder) -     Ambulatory referral to Psychiatry  Primary hypertension BP goal - < 130/80 Explained that having normal blood pressure is the goal and medications are helping to get to goal and maintain normal blood pressure. DIET: Limit salt intake, read nutrition labels to check salt content, limit fried and high fatty foods  Avoid using multisymptom OTC cold preparations that generally contain sudafed which can rise BP. Consult with pharmacist on best cold relief products to use  for persons with HTN EXERCISE Discussed incorporating exercise such as walking - 30 minutes most days of the week and can do in 10 minute intervals    -     valsartan-hydrochlorothiazide (DIOVAN-HCT) 160-25 MG tablet; Take 1 tablet by mouth daily.added amlodipine 68m daily -     CMP14+EGFR  Adjustment disorder with mixed anxiety and depressed mood -     Ambulatory referral to Psychiatry  Abnormal uterine bleeding (AUB) Large blood clot shown writer size of a orange several times a day. Changes pads  6-7 times she does not use tampoms -     Ambulatory referral to Gynecology  Fatigue, unspecified type -     CBC with Differential -     TSH + free T4 -     Vitamin D, 25-hydroxy  Family history of diabetes mellitus -     Hemoglobin A1c   Patient have been counseled extensively about nutrition and exercise. Other issues discussed during this visit include: low cholesterol diet, weight control and daily exercise, foot care, annual eye examinations at Ophthalmology, importance of adherence with medications and regular follow-up. We also discussed long term complications of uncontrolled diabetes and hypertension.   Return for June 12 1109.  The patient was given clear instructions to go to ER or return to medical center if symptoms don't improve, worsen or new problems develop. The patient verbalized understanding. The patient was told to call to get lab results if they haven't heard anything in the next week.   This note has been created with [employer]veyor, quantity Any transcriptional errors are unintentional.   MKerin Perna NP 05/15/2022, 5:12 PM

## 2022-05-16 ENCOUNTER — Telehealth (INDEPENDENT_AMBULATORY_CARE_PROVIDER_SITE_OTHER): Payer: Self-pay | Admitting: Primary Care

## 2022-05-16 LAB — CBC WITH DIFFERENTIAL/PLATELET
Basophils Absolute: 0.1 10*3/uL (ref 0.0–0.2)
Basos: 1 %
EOS (ABSOLUTE): 0.2 10*3/uL (ref 0.0–0.4)
Eos: 2 %
Hematocrit: 25.2 % — ABNORMAL LOW (ref 34.0–46.6)
Hemoglobin: 8.3 g/dL — ABNORMAL LOW (ref 11.1–15.9)
Immature Grans (Abs): 0 10*3/uL (ref 0.0–0.1)
Immature Granulocytes: 1 %
Lymphocytes Absolute: 1.8 10*3/uL (ref 0.7–3.1)
Lymphs: 24 %
MCH: 28.5 pg (ref 26.6–33.0)
MCHC: 32.9 g/dL (ref 31.5–35.7)
MCV: 87 fL (ref 79–97)
Monocytes Absolute: 0.8 10*3/uL (ref 0.1–0.9)
Monocytes: 10 %
Neutrophils Absolute: 4.9 10*3/uL (ref 1.4–7.0)
Neutrophils: 62 %
Platelets: 408 10*3/uL (ref 150–450)
RBC: 2.91 x10E6/uL — ABNORMAL LOW (ref 3.77–5.28)
RDW: 17 % — ABNORMAL HIGH (ref 11.7–15.4)
WBC: 7.7 10*3/uL (ref 3.4–10.8)

## 2022-05-16 LAB — CMP14+EGFR
ALT: 97 IU/L — ABNORMAL HIGH (ref 0–32)
AST: 116 IU/L — ABNORMAL HIGH (ref 0–40)
Albumin/Globulin Ratio: 1.4 (ref 1.2–2.2)
Albumin: 4.4 g/dL (ref 3.9–4.9)
Alkaline Phosphatase: 66 IU/L (ref 44–121)
BUN/Creatinine Ratio: 14 (ref 9–23)
BUN: 10 mg/dL (ref 6–20)
Bilirubin Total: 0.3 mg/dL (ref 0.0–1.2)
CO2: 27 mmol/L (ref 20–29)
Calcium: 9.3 mg/dL (ref 8.7–10.2)
Chloride: 96 mmol/L (ref 96–106)
Creatinine, Ser: 0.74 mg/dL (ref 0.57–1.00)
Globulin, Total: 3.2 g/dL (ref 1.5–4.5)
Glucose: 81 mg/dL (ref 70–99)
Potassium: 3.9 mmol/L (ref 3.5–5.2)
Sodium: 135 mmol/L (ref 134–144)
Total Protein: 7.6 g/dL (ref 6.0–8.5)
eGFR: 107 mL/min/{1.73_m2} (ref >59)

## 2022-05-16 LAB — HEMOGLOBIN A1C
Est. average glucose Bld gHb Est-mCnc: 111 mg/dL
Hgb A1c MFr Bld: 5.5 % (ref 4.8–5.6)

## 2022-05-16 LAB — VITAMIN D 25 HYDROXY (VIT D DEFICIENCY, FRACTURES): Vit D, 25-Hydroxy: 23.5 ng/mL — ABNORMAL LOW (ref 30.0–100.0)

## 2022-05-16 LAB — TSH+FREE T4
Free T4: 1.24 ng/dL (ref 0.82–1.77)
TSH: 0.88 u[IU]/mL (ref 0.450–4.500)

## 2022-05-16 NOTE — Telephone Encounter (Signed)
Copied from [place] [phone]. Topic: General - Other >> May 16, 2022 11:31 AM Everette C wrote: Reason for CRM: The patient would like to be contacted by a member of clinical staff to review their recent lab results  Please contact further when possible

## 2022-05-19 ENCOUNTER — Other Ambulatory Visit (INDEPENDENT_AMBULATORY_CARE_PROVIDER_SITE_OTHER): Payer: Self-pay | Admitting: Primary Care

## 2022-05-19 ENCOUNTER — Encounter (INDEPENDENT_AMBULATORY_CARE_PROVIDER_SITE_OTHER): Payer: Self-pay | Admitting: Primary Care

## 2022-05-19 MED ORDER — FERROUS SULFATE 325 (65 FE) MG PO TABS: 325.0000 mg | ORAL_TABLET | Freq: Every day | ORAL | 1 refills | Status: DC

## 2022-05-21 NOTE — Telephone Encounter (Signed)
Provider has taken care of this and pt is aware of labs

## 2022-06-12 ENCOUNTER — Ambulatory Visit (INDEPENDENT_AMBULATORY_CARE_PROVIDER_SITE_OTHER): Payer: Self-pay | Admitting: Primary Care

## 2022-06-12 ENCOUNTER — Ambulatory Visit (INDEPENDENT_AMBULATORY_CARE_PROVIDER_SITE_OTHER): Payer: Self-pay

## 2022-06-12 NOTE — Telephone Encounter (Signed)
  Chief Complaint: Frequency, urgency, abdominal pain and blood on toilet tissue. Cold s/s. Symptoms: above Frequency: 3 days Pertinent Negatives: Patient denies  Disposition: '[]'$ ED /'[x]'$ Urgent Care (no appt availability in office) / '[]'$ Appointment(In office/virtual)/ '[]'$  [facility]/ '[]'$ Home Care/ '[]'$ Refused Recommended Disposition /'[]'$ [facility]/ '[]'$  Follow-up with PCP Additional Notes: Pt called with UTI s/s of urgency and frequency. Pt also reports mild abdominal cramping. Pt also had cold s/s of head pressure and chills.    Reason for Disposition  Urinating more frequently than usual (i.e., frequency)  Answer Assessment - Initial Assessment Questions 1. SYMPTOM: "What's the main symptom you're concerned about?" (e.g., frequency, incontinence)     Frequency and Urgency 2. ONSET: "When did the    start?"     2 days ago 3. PAIN: "Is there any pain?" If Yes, ask: "How bad is it?" (Scale: 1-10; mild, moderate, severe)     mild 4. CAUSE: "What do you think is causing the symptoms?"     Cold. - uti 5. OTHER SYMPTOMS: "Do you have any other symptoms?" (e.g., blood in urine, fever, flank pain, pain with urination)     Abdominal pain, chills, head pressure. 6. PREGNANCY: "Is there any chance you are pregnant?" "When was your last menstrual period?"     no  Protocols used: Urinary Symptoms-A-AH

## 2022-06-13 NOTE — Telephone Encounter (Signed)
FYI

## 2022-06-16 NOTE — Telephone Encounter (Signed)
Agreed -

## 2022-07-09 ENCOUNTER — Ambulatory Visit (INDEPENDENT_AMBULATORY_CARE_PROVIDER_SITE_OTHER): Payer: Medicaid Other | Admitting: Primary Care

## 2022-07-17 ENCOUNTER — Ambulatory Visit (HOSPITAL_COMMUNITY): Payer: Medicaid Other | Admitting: Student

## 2022-07-22 ENCOUNTER — Ambulatory Visit (INDEPENDENT_AMBULATORY_CARE_PROVIDER_SITE_OTHER): Payer: Medicaid Other | Admitting: Primary Care

## 2022-07-31 ENCOUNTER — Encounter: Payer: Medicaid Other | Admitting: Obstetrics and Gynecology

## 2022-08-18 ENCOUNTER — Ambulatory Visit (INDEPENDENT_AMBULATORY_CARE_PROVIDER_SITE_OTHER): Payer: Medicaid Other | Admitting: Primary Care

## 2022-08-18 ENCOUNTER — Encounter (INDEPENDENT_AMBULATORY_CARE_PROVIDER_SITE_OTHER): Payer: Self-pay

## 2022-08-28 ENCOUNTER — Encounter: Payer: Medicaid Other | Admitting: Obstetrics and Gynecology

## 2022-09-04 ENCOUNTER — Ambulatory Visit (INDEPENDENT_AMBULATORY_CARE_PROVIDER_SITE_OTHER): Payer: Medicaid Other | Admitting: Primary Care

## 2022-09-15 ENCOUNTER — Ambulatory Visit (INDEPENDENT_AMBULATORY_CARE_PROVIDER_SITE_OTHER): Payer: Medicaid Other | Admitting: Primary Care

## 2022-09-18 ENCOUNTER — Ambulatory Visit (INDEPENDENT_AMBULATORY_CARE_PROVIDER_SITE_OTHER): Payer: Medicaid Other | Admitting: Primary Care

## 2022-10-02 ENCOUNTER — Encounter (INDEPENDENT_AMBULATORY_CARE_PROVIDER_SITE_OTHER): Payer: Self-pay

## 2022-10-02 ENCOUNTER — Telehealth (INDEPENDENT_AMBULATORY_CARE_PROVIDER_SITE_OTHER): Payer: Self-pay | Admitting: Primary Care

## 2022-10-02 ENCOUNTER — Encounter (INDEPENDENT_AMBULATORY_CARE_PROVIDER_SITE_OTHER): Payer: Medicaid Other | Admitting: Primary Care

## 2022-10-02 NOTE — Telephone Encounter (Signed)
Attempted to reach patient to inform her we needed to have her visit virtually. Due to an emergency, Sharyn Lull is not in the office and will be doing all her visits  via [place]. Was not able to leave her a message because her mailbox was full.

## 2022-10-03 ENCOUNTER — Ambulatory Visit (INDEPENDENT_AMBULATORY_CARE_PROVIDER_SITE_OTHER): Payer: Self-pay

## 2022-10-03 ENCOUNTER — Ambulatory Visit (HOSPITAL_COMMUNITY)
Admission: EM | Admit: 2022-10-03 | Discharge: 2022-10-03 | Disposition: A | Discharge status: Home / Self Care | Payer: Medicaid Other | Attending: Family Medicine | Admitting: Family Medicine

## 2022-10-03 ENCOUNTER — Other Ambulatory Visit (INDEPENDENT_AMBULATORY_CARE_PROVIDER_SITE_OTHER): Payer: Self-pay | Admitting: Primary Care

## 2022-10-03 ENCOUNTER — Telehealth: Payer: Medicaid Other | Admitting: Physician Assistant

## 2022-10-03 DIAGNOSIS — R5383 Other fatigue: Secondary | ICD-10-CM | POA: Diagnosis not present

## 2022-10-03 DIAGNOSIS — N390 Urinary tract infection, site not specified: Secondary | ICD-10-CM

## 2022-10-03 DIAGNOSIS — R109 Unspecified abdominal pain: Secondary | ICD-10-CM | POA: Diagnosis present

## 2022-10-03 DIAGNOSIS — R3989 Other symptoms and signs involving the genitourinary system: Secondary | ICD-10-CM

## 2022-10-03 DIAGNOSIS — L309 Dermatitis, unspecified: Secondary | ICD-10-CM

## 2022-10-03 LAB — CBC WITH DIFFERENTIAL/PLATELET
Abs Immature Granulocytes: 0.02 10*3/uL (ref 0.00–0.07)
Basophils Absolute: 0.1 10*3/uL (ref 0.0–0.1)
Basophils Relative: 1 %
Eosinophils Absolute: 0.3 10*3/uL (ref 0.0–0.5)
Eosinophils Relative: 3 %
HCT: 40.4 % (ref 36.0–46.0)
Hemoglobin: 13.3 g/dL (ref 12.0–15.0)
Immature Granulocytes: 0 %
Lymphocytes Relative: 22 %
Lymphs Abs: 1.8 10*3/uL (ref 0.7–4.0)
MCH: 27.7 pg (ref 26.0–34.0)
MCHC: 32.9 g/dL (ref 30.0–36.0)
MCV: 84 fL (ref 80.0–100.0)
Monocytes Absolute: 0.9 10*3/uL (ref 0.1–1.0)
Monocytes Relative: 11 %
Neutro Abs: 5.2 10*3/uL (ref 1.7–7.7)
Neutrophils Relative %: 63 %
Platelets: 305 10*3/uL (ref 150–400)
RBC: 4.81 MIL/uL (ref 3.87–5.11)
RDW: 20.2 % — ABNORMAL HIGH (ref 11.5–15.5)
WBC: 8.2 10*3/uL (ref 4.0–10.5)
nRBC: 0 % (ref 0.0–0.2)

## 2022-10-03 LAB — COMPREHENSIVE METABOLIC PANEL
ALT: 101 U/L — ABNORMAL HIGH (ref 0–44)
AST: 118 U/L — ABNORMAL HIGH (ref 15–41)
Albumin: 3.8 g/dL (ref 3.5–5.0)
Alkaline Phosphatase: 53 U/L (ref 38–126)
Anion gap: 10 (ref 5–15)
BUN: 12 mg/dL (ref 6–20)
CO2: 25 mmol/L (ref 22–32)
Calcium: 9.4 mg/dL (ref 8.9–10.3)
Chloride: 99 mmol/L (ref 98–111)
Creatinine, Ser: 0.82 mg/dL (ref 0.44–1.00)
GFR, Estimated: >60 mL/min (ref >60)
Glucose, Bld: 89 mg/dL (ref 70–99)
Potassium: 3.5 mmol/L (ref 3.5–5.1)
Sodium: 134 mmol/L — ABNORMAL LOW (ref 135–145)
Total Bilirubin: 0.7 mg/dL (ref 0.3–1.2)
Total Protein: 7.8 g/dL (ref 6.5–8.1)

## 2022-10-03 LAB — POCT URINALYSIS DIPSTICK, ED / UC
Bilirubin Urine: NEGATIVE
Glucose, UA: NEGATIVE mg/dL
Ketones, ur: NEGATIVE mg/dL
Nitrite: POSITIVE — AB
Protein, ur: NEGATIVE mg/dL
Specific Gravity, Urine: 1.02 (ref 1.005–1.030)
Urobilinogen, UA: 0.2 mg/dL (ref 0.0–1.0)
pH: 5.5 (ref 5.0–8.0)

## 2022-10-03 LAB — CBG MONITORING, ED: Glucose-Capillary: 95 mg/dL (ref 70–99)

## 2022-10-03 LAB — LIPASE, BLOOD: Lipase: 32 U/L (ref 11–51)

## 2022-10-03 MED ORDER — SULFAMETHOXAZOLE-TRIMETHOPRIM 800-160 MG PO TABS: 1.0000 | ORAL_TABLET | Freq: Two times a day (BID) | ORAL | 0 refills | Status: AC

## 2022-10-03 NOTE — Telephone Encounter (Signed)
  Chief Complaint: dysuria Symptoms: urinary freq. And lower abd pain and back pain, chills Frequency: 2 weeks  Pertinent Negatives: Patient denies blood in urine,  Disposition: '[]'$ ED /'[]'$ Urgent Care (no appt availability in office) / '[]'$ Appointment(In office/virtual)/ '[x]'$  [facility]/ '[]'$ Home Care/ '[]'$ Refused Recommended Disposition /'[]'$  Mobile Bus/ '[]'$  Follow-up with PCP Additional Notes: no appts available Reason for Disposition  Side (flank) or lower back pain present  Answer Assessment - Initial Assessment Questions 1. SYMPTOM: "What's the main symptom you're concerned about?" (e.g., frequency, incontinence)     Burning frequency, bottom of abd and back pain, chills 2. ONSET: "When did the  *No Answer*  start?"     2 weeks off and on  3. PAIN: "Is there any pain?" If Yes, ask: "How bad is it?" (Scale: 1-10; mild, moderate, severe)     8  4. CAUSE: "What do you think is causing the symptoms?"     UTI  5. OTHER SYMPTOMS: "Do you have any other symptoms?" (e.g., blood in urine, fever, flank pain, pain with urination)     Back pain and pain with urination 6. PREGNANCY: "Is there any chance you are pregnant?" "When was your last menstrual period?"     N/a  Protocols used: Urinary Symptoms-A-AH

## 2022-10-03 NOTE — Patient Instructions (Signed)
Freida Busman, thank you for joining Mar Daring, PA-C for today's virtual visit.  While this provider is not your primary care provider (PCP), if your PCP is located in our provider database this encounter information will be shared with them immediately following your visit.   [facility] account gives you access to today's visit and all your visits, tests, and labs performed at [facility] " click here if you don't have a [facility] account or go to [email or web address]  Consent: (Patient) Freida Busman provided verbal consent for this virtual visit at the beginning of the encounter.  Current Medications:  Current Outpatient Medications:    buPROPion (WELLBUTRIN XL) 150 MG 24 hr tablet, Take 1 tablet (150 mg total) by mouth daily. (Patient not taking: Reported on 05/15/2022), Disp: 90 tablet, Rfl: 0   ferrous sulfate 325 (65 FE) MG tablet, Take 1 tablet (325 mg total) by mouth daily with breakfast., Disp: 90 tablet, Rfl: 1   omeprazole (PRILOSEC) 20 MG capsule, TAKE 1 CAPSULE BY MOUTH EVERY DAY (Patient not taking: Reported on 05/15/2022), Disp: 90 capsule, Rfl: 0   traZODone (DESYREL) 50 MG tablet, Take 1-2 tablets (50-100 mg total) by mouth at bedtime as needed. for sleep (Patient not taking: Reported on 05/15/2022), Disp: 90 tablet, Rfl: 1   triamcinolone ointment (KENALOG) 0.5 %, Apply 1 application topically 2 (two) times daily. (Patient not taking: Reported on 05/15/2022), Disp: 45 g, Rfl: 1   valsartan-hydrochlorothiazide (DIOVAN-HCT) 160-25 MG tablet, Take 1 tablet by mouth daily., Disp: 90 tablet, Rfl: 3   Medications ordered in this encounter:  No orders of the defined types were placed in this encounter.    *If you need refills on other medications prior to your next appointment, please contact your pharmacy*  Follow-Up: Call back or seek an in-person evaluation if the symptoms worsen or if the condition fails to improve as  anticipated.  [place] [phone]  Other Instructions  If you are having a true medical emergency please call 911.      For an urgent face to face visit, [place] has eight urgent care centers for your convenience:   NEW!! [place] [facility] [place] [facility] [place] Get Driving Directions [id number] [address] [place], [place] [facility] [place] Get Driving Directions [id number] [place], [place] [address]   [place] [facility]) Get Driving Directions [id number] [address], [place] [address]  [place] [facility] [place] Get Driving Directions [id number] [address] [place],  [place]  [address]  [place] [facility] [place] [facility] - at [address] [place] Get Driving Directions  [phone] [address] [place],  [place] [address]   [place] [facility] Get Driving Directions [id number] [place], [place], [place] [address]   [place] [facility] Get Driving Directions  [id number] [address] [place], [place] [address]   [place] [facility] [place] Get Driving Directions [id number] [address] [place],  [address]    If you have been instructed to have an in-person evaluation today at a local Urgent Care facility, please use the link below. It will take you to a list of all of our available [facility]gent [facility], including address, phone number and  hours of operation. Please do not delay care.  [facility]  If you or a family member do not have a primary care provider, use the link below to schedule a visit and establish care. When you choose a [facility] primary care physician or advanced practice provider, you gain a  long-term partner in health. Find a Primary Care Provider  Learn more about [facility] in-office and virtual care options: [facility]

## 2022-10-03 NOTE — ED Triage Notes (Signed)
Pt is here for Dysuria for several weeks. Pt reports fatigue and not feeling like herself.

## 2022-10-03 NOTE — Discharge Instructions (Addendum)
You were seen today for various symptoms.  Your urine does appear to show infection.  This could be the cause for your symptoms.  However, I have ordered blood work to be sure nothing else is going on.  This will be resulted tomorrow and you should be able to see this on mychart.  If there is anything concerning our nurse will call to notify you and discuss next steps.  Your blood sugar today was normal.  I recommend you get plenty of rest and fluids.  If you start to feel worse then please go to the ER for further evaluation.

## 2022-10-03 NOTE — ED Provider Notes (Signed)
[place] [facility]    CSN: [id number] Arrival date & time: 10/03/22  1423      History   Chief Complaint Chief Complaint  Patient presents with   Urinary Tract Infection   Fatigue   Dysuria    HPI Paula Giles is a 37 y.o. female.   Patient is here for abdominal pain, fatigue, cold chills, sweats for several weeks.  She is having dysuria and frequency.  She is taking azo and motrin without help.  No vaginal symptoms.  Some sinus congestion.  No drainage.  No cough. Having some headaches and fatigue.  Sinus pressure, frontal headache for several weeks.  Constipation.  Abdominal pain epigastric around the left abdomen to the lower abdomen.  Decreased appetite, but able to eat/drink.  She is thirsty.  H/o pre-diabetes.       Past Medical History:  Diagnosis Date   Anxiety    BV (bacterial vaginosis)    Irregular menstrual bleeding    Mood disorder (HCC)    Seasonal allergies     Patient Active Problem List   Diagnosis Date Noted   Elevated LFTs 11/19/2020   Vaginal discharge 11/19/2020   Dysuria 11/19/2020   Hypertension 08/09/2020   Normocytic anemia 08/09/2020   Pre-diabetes 08/09/2020   Obesity 08/09/2020   Hematuria 08/09/2020   Generalized anxiety disorder with panic attacks 08/09/2020   MDD (major depressive disorder) 08/09/2020   Abnormal uterine bleeding 08/09/2020    Past Surgical History:  Procedure Laterality Date   CESAREAN SECTION  2009   WISDOM TOOTH EXTRACTION      OB History     Gravida  1   Para  1   Term  1   Preterm      AB      Living         SAB      IAB      Ectopic      Multiple      Live Births               Home Medications    Prior to Admission medications   Medication Sig Start Date End Date Taking? Authorizing Provider  valsartan-hydrochlorothiazide (DIOVAN-HCT) 160-25 MG tablet Take 1 tablet by mouth daily. 05/15/22  Yes Kerin Perna, NP  buPROPion (WELLBUTRIN XL) 150 MG 24 hr  tablet Take 1 tablet (150 mg total) by mouth daily. Patient not taking: Reported on 05/15/2022 01/29/22   Mar Daring, PA-C  ferrous sulfate 325 (65 FE) MG tablet Take 1 tablet (325 mg total) by mouth daily with breakfast. 05/19/22   Kerin Perna, NP  omeprazole (PRILOSEC) 20 MG capsule TAKE 1 CAPSULE BY MOUTH EVERY DAY Patient not taking: Reported on 05/15/2022 02/17/22   Kerin Perna, NP  traZODone (DESYREL) 50 MG tablet Take 1-2 tablets (50-100 mg total) by mouth at bedtime as needed. for sleep Patient not taking: Reported on 05/15/2022 01/29/22   Mar Daring, PA-C  triamcinolone ointment (KENALOG) 0.5 % Apply 1 application topically 2 (two) times daily. Patient not taking: Reported on 05/15/2022 07/02/21   Kerin Perna, NP    Family History No family history on file.  Social History Social History   Tobacco Use   Smoking status: Never   Smokeless tobacco: Never  Vaping Use   Vaping Use: Never used  Substance Use Topics   Alcohol use: Yes    Comment: socially   Drug use: No  Allergies   Latex   Review of Systems Review of Systems  Constitutional:  Positive for chills and fatigue. Negative for fever.  HENT:  Positive for congestion and sinus pressure. Negative for rhinorrhea.   Respiratory:  Negative for cough and shortness of breath.   Gastrointestinal:  Positive for abdominal pain and constipation.  Endocrine: Positive for polydipsia.  Genitourinary:  Positive for dysuria and frequency.  Musculoskeletal: Negative.   Psychiatric/Behavioral: Negative.       Physical Exam Triage Vital Signs ED Triage Vitals [10/03/22 1508]  Enc Vitals Group     BP 117/82     Pulse Rate 89     Resp 16     Temp 98.6 F (37 C)     Temp Source Oral     SpO2 100 %     Weight      Height      Head Circumference      Peak Flow      Pain Score      Pain Loc      Pain Edu?      Excl. in [place]?    No data found.  Updated Vital Signs BP  117/82 (BP Location: Left Arm)   Pulse 89   Temp 98.6 F (37 C) (Oral)   Resp 16   LMP 08/23/2022 (Approximate)   SpO2 100%   Visual Acuity Right Eye Distance:   Left Eye Distance:   Bilateral Distance:    Right Eye Near:   Left Eye Near:    Bilateral Near:     Physical Exam Constitutional:      General: She is not in acute distress.    Appearance: Normal appearance. She is not ill-appearing.  HENT:     Nose:     Right Sinus: Maxillary sinus tenderness present.     Left Sinus: Maxillary sinus tenderness present.  Cardiovascular:     Rate and Rhythm: Normal rate and regular rhythm.  Pulmonary:     Effort: Pulmonary effort is normal.     Breath sounds: Normal breath sounds.  Abdominal:     Palpations: Abdomen is soft.     Tenderness: There is no guarding or rebound.     Comments: TTP to the epigastric, LUQ, LLQ and suprapubic area;    Musculoskeletal:     Cervical back: Normal range of motion and neck supple. Tenderness present.  Lymphadenopathy:     Cervical: No cervical adenopathy.  Neurological:     General: No focal deficit present.     Mental Status: She is alert.  Psychiatric:        Mood and Affect: Mood normal.      UC Treatments / Results  Labs (all labs ordered are listed, but only abnormal results are displayed) Labs Reviewed  POCT URINALYSIS DIPSTICK, ED / UC - Abnormal; Notable for the following components:      Result Value   Hgb urine dipstick LARGE (*)    Nitrite POSITIVE (*)    Leukocytes,Ua TRACE (*)    All other components within normal limits  URINE CULTURE  COMPREHENSIVE METABOLIC PANEL  CBC WITH DIFFERENTIAL/PLATELET  LIPASE, BLOOD  CBG MONITORING, ED  CBG 95  EKG   Radiology No results found.  Procedures Procedures (including critical care time)  Medications Ordered in UC Medications - No data to display  Initial Impression / Assessment and Plan / UC Course  I have reviewed the triage vital signs and the nursing  notes.  Pertinent labs &  imaging results that were available during my care of the patient were reviewed by me and considered in my medical decision making (see chart for details).   Final Clinical Impressions(s) / UC Diagnoses   Final diagnoses:  Urinary tract infection without hematuria, site unspecified  Other fatigue  Abdominal pain, unspecified abdominal location     Discharge Instructions      You were seen today for various symptoms.  Your urine does appear to show infection.  This could be the cause for your symptoms.  However, I have ordered blood work to be sure nothing else is going on.  This will be resulted tomorrow and you should be able to see this on mychart.  If there is anything concerning our nurse will call to notify you and discuss next steps.  Your blood sugar today was normal.  I recommend you get plenty of rest and fluids.  If you start to feel worse then please go to the ER for further evaluation.     ED Prescriptions     Medication Sig Dispense Auth. Provider   sulfamethoxazole-trimethoprim (BACTRIM DS) 800-160 MG tablet Take 1 tablet by mouth 2 (two) times daily for 7 days. 14 tablet Rondel Oh, MD      PDMP not reviewed this encounter.   Rondel Oh, MD 10/03/22 [phone]

## 2022-10-03 NOTE — Progress Notes (Signed)
Virtual Visit Consent   Paula Giles, you are scheduled for a virtual visit with a [facility] provider today. Just as with appointments in the office, your consent must be obtained to participate. Your consent will be active for this visit and any virtual visit you may have with one of our providers in the next 365 days. If you have a MyChart account, a copy of this consent can be sent to you electronically.  As this is a virtual visit, video technology does not allow for your provider to perform a traditional examination. This may limit your provider's ability to fully assess your condition. If your provider identifies any concerns that need to be evaluated in person or the need to arrange testing (such as labs, EKG, etc.), we will make arrangements to do so. Although advances in technology are sophisticated, we cannot ensure that it will always work on either your end or our end. If the connection with a video visit is poor, the visit may have to be switched to a telephone visit. With either a video or telephone visit, we are not always able to ensure that we have a secure connection.  By engaging in this virtual visit, you consent to the provision of healthcare and authorize for your insurance to be billed (if applicable) for the services provided during this visit. Depending on your insurance coverage, you may receive a charge related to this service.  I need to obtain your verbal consent now. Are you willing to proceed with your visit today? Paula Giles has provided verbal consent on 10/03/2022 for a virtual visit (video or telephone). Mar Daring, PA-C  Date: 10/03/2022 9:35 AM  Virtual Visit via Video Note   I, Mar Daring, connected with  Paula Giles  ([id number], 10/02/1985) on 10/03/22 at  9:30 AM EST by a video-enabled telemedicine application and verified that I am speaking with the correct person using two identifiers.  Location: Patient: Virtual Visit Location  Patient: Home Provider: Virtual Visit Location Provider: Home Office   I discussed the limitations of evaluation and management by telemedicine and the availability of in person appointments. The patient expressed understanding and agreed to proceed.    History of Present Illness: Paula Giles is a 37 y.o. who identifies as a female who was assigned female at birth, and is being seen today for possible UTI.  HPI: Urinary Tract Infection  This is a new problem. The current episode started 1 to 4 weeks ago (2 weeks). The problem occurs intermittently (off and on for 2 weeks). The problem has been unchanged. The quality of the pain is described as aching and burning. The pain is moderate. There has been no fever. Associated symptoms include chills, a discharge, flank pain, frequency, hematuria (last week), hesitancy, nausea and urgency. Pertinent negatives include no vomiting. Associated symptoms comments: Abdominal pain. She has tried nothing for the symptoms. The treatment provided no relief.     Problems:  Patient Active Problem List   Diagnosis Date Noted   Elevated LFTs 11/19/2020   Vaginal discharge 11/19/2020   Dysuria 11/19/2020   Hypertension 08/09/2020   Normocytic anemia 08/09/2020   Pre-diabetes 08/09/2020   Obesity 08/09/2020   Hematuria 08/09/2020   Generalized anxiety disorder with panic attacks 08/09/2020   MDD (major depressive disorder) 08/09/2020   Abnormal uterine bleeding 08/09/2020    Allergies:  Allergies  Allergen Reactions   Latex Rash   Medications:  Current Outpatient Medications:  buPROPion (WELLBUTRIN XL) 150 MG 24 hr tablet, Take 1 tablet (150 mg total) by mouth daily. (Patient not taking: Reported on 05/15/2022), Disp: 90 tablet, Rfl: 0   ferrous sulfate 325 (65 FE) MG tablet, Take 1 tablet (325 mg total) by mouth daily with breakfast., Disp: 90 tablet, Rfl: 1   omeprazole (PRILOSEC) 20 MG capsule, TAKE 1 CAPSULE BY MOUTH EVERY DAY (Patient not  taking: Reported on 05/15/2022), Disp: 90 capsule, Rfl: 0   traZODone (DESYREL) 50 MG tablet, Take 1-2 tablets (50-100 mg total) by mouth at bedtime as needed. for sleep (Patient not taking: Reported on 05/15/2022), Disp: 90 tablet, Rfl: 1   triamcinolone ointment (KENALOG) 0.5 %, Apply 1 application topically 2 (two) times daily. (Patient not taking: Reported on 05/15/2022), Disp: 45 g, Rfl: 1   valsartan-hydrochlorothiazide (DIOVAN-HCT) 160-25 MG tablet, Take 1 tablet by mouth daily., Disp: 90 tablet, Rfl: 3  Observations/Objective: Patient is well-developed, well-nourished in no acute distress.  Resting comfortably at home.  Head is normocephalic, atraumatic.  No labored breathing.  Speech is clear and coherent with logical content.  Patient is alert and oriented at baseline.    Assessment and Plan: 1. Suspected UTI  -Symptoms of UTI with now abdominal pain, nausea, chills, and flank pain - Advised in person evaluation to rule out pyelonephritis  Follow Up Instructions: I discussed the assessment and treatment plan with the patient. The patient was provided an opportunity to ask questions and all were answered. The patient agreed with the plan and demonstrated an understanding of the instructions.  A copy of instructions were sent to the patient via MyChart unless otherwise noted below.    The patient was advised to call back or seek an in-person evaluation if the symptoms worsen or if the condition fails to improve as anticipated.  Time:  I spent 8 minutes with the patient via telehealth technology discussing the above problems/concerns.    Mar Daring, PA-C

## 2022-10-04 ENCOUNTER — Ambulatory Visit (HOSPITAL_COMMUNITY): Payer: Self-pay

## 2022-10-04 LAB — URINE CULTURE: Culture: <10000 — AB

## 2022-10-06 NOTE — Telephone Encounter (Signed)
Requested medication (s) are due for refill today: yes  Requested medication (s) are on the active medication list: no  Last refill:  07/02/21 and rx ended 10/03/22   Future visit scheduled: no  Notes to clinic:  med not delegated to NT to RF   Requested Prescriptions  Pending Prescriptions Disp Refills   triamcinolone ointment (KENALOG) 0.5 % [Pharmacy Med Name: TRIAMCINOLONE 0.5% OINTMENT] 45 g 1    Sig: APPLY TO AFFECTED AREA TWICE A DAY     Not Delegated - Dermatology:  Corticosteroids Failed - 10/03/2022  5:33 PM      Failed - This refill cannot be delegated      Passed - Valid encounter within last 12 months    Recent Outpatient Visits           4 days ago    [place], Duncan, NP   4 months ago GAD (generalized anxiety disorder)   [place], Michelle P, NP   8 months ago Subacute maxillary sinusitis   [facility] Kerin Perna, NP   1 year ago Encounter to establish care   [facility] Kerin Perna, NP

## 2022-10-13 NOTE — Progress Notes (Signed)
Patient was no-show

## 2022-10-27 ENCOUNTER — Other Ambulatory Visit (INDEPENDENT_AMBULATORY_CARE_PROVIDER_SITE_OTHER): Payer: Self-pay | Admitting: Primary Care

## 2022-10-27 DIAGNOSIS — L309 Dermatitis, unspecified: Secondary | ICD-10-CM

## 2022-10-27 DIAGNOSIS — I10 Essential (primary) hypertension: Secondary | ICD-10-CM

## 2022-10-27 NOTE — Telephone Encounter (Signed)
Medication Refill - Medication: Buspirone, valsartan-hydrochlorothiazide (DIOVAN-HCT) 160-25 MG tablet , ferrous sulfate 325 (65 FE) MG tablet , triamcinolone ointment (KENALOG) 0.5 % [Pharmacy Med Name: TRIAMCINOLONE 0.5% OINTMENT]   Has the patient contacted their pharmacy? No. (Agent: If no, request that the patient contact the pharmacy for the refill. If patient does not wish to contact the pharmacy document the reason why and proceed with request.)   Preferred Pharmacy (with phone number or street name):  [id number] - [place], [place]  [id number]  [place] [phone]  Phone: [phone] Fax: [phone]  Hours: Open 24 hours   Has the patient been seen for an appointment in the last year OR does the patient have an upcoming appointment? Yes.    Agent: Please be advised that RX refills may take up to 3 business days. We ask that you follow-up with your pharmacy.

## 2022-10-28 ENCOUNTER — Encounter (HOSPITAL_COMMUNITY): Payer: Self-pay

## 2022-10-28 ENCOUNTER — Emergency Department (HOSPITAL_COMMUNITY)
Admission: EM | Admit: 2022-10-28 | Discharge: 2022-10-28 | Disposition: A | Discharge status: Home / Self Care | Payer: Medicaid Other | Attending: Emergency Medicine | Admitting: Emergency Medicine

## 2022-10-28 ENCOUNTER — Other Ambulatory Visit: Payer: Self-pay

## 2022-10-28 ENCOUNTER — Emergency Department (HOSPITAL_COMMUNITY): Payer: Medicaid Other

## 2022-10-28 DIAGNOSIS — Z1152 Encounter for screening for COVID-19: Secondary | ICD-10-CM | POA: Diagnosis not present

## 2022-10-28 DIAGNOSIS — D219 Benign neoplasm of connective and other soft tissue, unspecified: Secondary | ICD-10-CM

## 2022-10-28 DIAGNOSIS — D259 Leiomyoma of uterus, unspecified: Secondary | ICD-10-CM | POA: Insufficient documentation

## 2022-10-28 DIAGNOSIS — Z91040 Latex allergy status: Secondary | ICD-10-CM | POA: Insufficient documentation

## 2022-10-28 DIAGNOSIS — R519 Headache, unspecified: Secondary | ICD-10-CM | POA: Diagnosis not present

## 2022-10-28 DIAGNOSIS — R7989 Other specified abnormal findings of blood chemistry: Secondary | ICD-10-CM | POA: Diagnosis not present

## 2022-10-28 DIAGNOSIS — R102 Pelvic and perineal pain: Secondary | ICD-10-CM | POA: Diagnosis present

## 2022-10-28 LAB — CBC WITH DIFFERENTIAL/PLATELET
Abs Immature Granulocytes: 0.01 10*3/uL (ref 0.00–0.07)
Basophils Absolute: 0 10*3/uL (ref 0.0–0.1)
Basophils Relative: 1 %
Eosinophils Absolute: 0.2 10*3/uL (ref 0.0–0.5)
Eosinophils Relative: 3 %
HCT: 41.8 % (ref 36.0–46.0)
Hemoglobin: 13.1 g/dL (ref 12.0–15.0)
Immature Granulocytes: 0 %
Lymphocytes Relative: 29 %
Lymphs Abs: 1.6 10*3/uL (ref 0.7–4.0)
MCH: 27.5 pg (ref 26.0–34.0)
MCHC: 31.3 g/dL (ref 30.0–36.0)
MCV: 87.8 fL (ref 80.0–100.0)
Monocytes Absolute: 0.7 10*3/uL (ref 0.1–1.0)
Monocytes Relative: 12 %
Neutro Abs: 2.9 10*3/uL (ref 1.7–7.7)
Neutrophils Relative %: 55 %
Platelets: 301 10*3/uL (ref 150–400)
RBC: 4.76 MIL/uL (ref 3.87–5.11)
RDW: 18 % — ABNORMAL HIGH (ref 11.5–15.5)
WBC: 5.4 10*3/uL (ref 4.0–10.5)
nRBC: 0 % (ref 0.0–0.2)

## 2022-10-28 LAB — I-STAT BETA HCG BLOOD, ED (MC, WL, AP ONLY): I-stat hCG, quantitative: <5 m[IU]/mL (ref <5)

## 2022-10-28 LAB — WET PREP, GENITAL
Clue Cells Wet Prep HPF POC: NONE SEEN
Sperm: NONE SEEN
Trich, Wet Prep: NONE SEEN
WBC, Wet Prep HPF POC: <10 (ref <10)
Yeast Wet Prep HPF POC: NONE SEEN

## 2022-10-28 LAB — COMPREHENSIVE METABOLIC PANEL
ALT: 93 U/L — ABNORMAL HIGH (ref 0–44)
AST: 100 U/L — ABNORMAL HIGH (ref 15–41)
Albumin: 3.8 g/dL (ref 3.5–5.0)
Alkaline Phosphatase: 55 U/L (ref 38–126)
Anion gap: 13 (ref 5–15)
BUN: 14 mg/dL (ref 6–20)
CO2: 26 mmol/L (ref 22–32)
Calcium: 9.3 mg/dL (ref 8.9–10.3)
Chloride: 97 mmol/L — ABNORMAL LOW (ref 98–111)
Creatinine, Ser: 0.85 mg/dL (ref 0.44–1.00)
GFR, Estimated: >60 mL/min (ref >60)
Glucose, Bld: 76 mg/dL (ref 70–99)
Potassium: 3.6 mmol/L (ref 3.5–5.1)
Sodium: 136 mmol/L (ref 135–145)
Total Bilirubin: 0.6 mg/dL (ref 0.3–1.2)
Total Protein: 8.1 g/dL (ref 6.5–8.1)

## 2022-10-28 LAB — URINALYSIS, ROUTINE W REFLEX MICROSCOPIC
Bacteria, UA: NONE SEEN
Bilirubin Urine: NEGATIVE
Glucose, UA: NEGATIVE mg/dL
Ketones, ur: NEGATIVE mg/dL
Leukocytes,Ua: NEGATIVE
Nitrite: NEGATIVE
Protein, ur: NEGATIVE mg/dL
Specific Gravity, Urine: 1.015 (ref 1.005–1.030)
pH: 5 (ref 5.0–8.0)

## 2022-10-28 LAB — SARS CORONAVIRUS 2 BY RT PCR: SARS Coronavirus 2 by RT PCR: NEGATIVE

## 2022-10-28 LAB — LIPASE, BLOOD: Lipase: 23 U/L (ref 11–51)

## 2022-10-28 MED ORDER — DICYCLOMINE HCL 20 MG PO TABS: 20.0000 mg | ORAL_TABLET | Freq: Two times a day (BID) | ORAL | 0 refills | Status: DC

## 2022-10-28 MED ORDER — HYDROXYZINE HCL 25 MG PO TABS
25.0000 mg | ORAL_TABLET | Freq: Once | ORAL | Status: AC
  Administered 2022-10-28: 25 mg via ORAL
  Filled 2022-10-28: qty 1

## 2022-10-28 MED ORDER — KETOROLAC TROMETHAMINE 15 MG/ML IJ SOLN: 15.0000 mg | Freq: Once | INTRAMUSCULAR | Status: DC

## 2022-10-28 MED ORDER — IBUPROFEN 800 MG PO TABS
800.0000 mg | ORAL_TABLET | Freq: Once | ORAL | Status: AC
  Administered 2022-10-28: 800 mg via ORAL
  Filled 2022-10-28: qty 1

## 2022-10-28 MED ORDER — ACETAMINOPHEN 325 MG PO TABS
650.0000 mg | ORAL_TABLET | Freq: Once | ORAL | Status: AC
  Administered 2022-10-28: 650 mg via ORAL
  Filled 2022-10-28: qty 2

## 2022-10-28 MED ORDER — SODIUM CHLORIDE 0.9 % IV BOLUS
1000.0000 mL | Freq: Once | INTRAVENOUS | Status: AC
  Administered 2022-10-28: 1000 mL via INTRAVENOUS

## 2022-10-28 NOTE — ED Triage Notes (Signed)
Pt c/o HA intx1wk Pt denies N/V. Pt c/o blurred vision and "specks." Pelvic painx6d. Pt c/o beige vaginal discharge. Pt states she has felt like she's going to pass outx6d. LMP 10/03/22

## 2022-10-28 NOTE — ED Provider Notes (Signed)
[facility] Provider Note   CSN: [id number] Arrival date & time: 10/28/22  1517     History {Add pertinent medical, surgical, social history, OB history to HPI:1} Chief Complaint  Patient presents with   Abdominal Pain   Headache    Paula Giles is a 37 y.o. female.   Abdominal Pain Headache Associated symptoms: abdominal pain      Patient with medical history of anxiety presents to the emergency department due to pelvic pain.  Symptoms started 6 days ago, its constant.  Associated with vaginal discharge and increased urinary frequency without any dysuria or hematuria.  She is not currently sexually active but sexually active with men.  History of C-section, denies any other abdominal surgeries.  States that when the pain is worse she feels like she might pass out but has not passed out yet.  She is also having headache and started around the same times, it is temporal in location and waxes and wanes throughout the day.  She tried ibuprofen without any symptom resolution.  The pain gets really severe she closes her eyes but denies any loss of vision, double vision, blurry vision.  Home Medications Prior to Admission medications   Medication Sig Start Date End Date Taking? Authorizing Provider  ferrous sulfate 325 (65 FE) MG tablet Take 1 tablet (325 mg total) by mouth daily with breakfast. 05/19/22   Kerin Perna, NP  valsartan-hydrochlorothiazide (DIOVAN-HCT) 160-25 MG tablet Take 1 tablet by mouth daily. 05/15/22   Kerin Perna, NP      Allergies    Latex    Review of Systems   Review of Systems  Gastrointestinal:  Positive for abdominal pain.  Neurological:  Positive for headaches.    Physical Exam Updated Vital Signs BP 133/71 (BP Location: Right Arm)   Pulse 87   Temp 98.3 F (36.8 C) (Oral)   Resp 18   Ht 5\' 4"  (1.626 m)   Wt 123.8 kg   LMP 08/23/2022 (Approximate)   SpO2 97%   BMI 46.85 kg/m   Physical Exam Vitals and nursing note reviewed. Exam conducted with a chaperone present.  Constitutional:      Appearance: Normal appearance. She is obese.  HENT:     Head: Normocephalic and atraumatic.  Eyes:     General: No scleral icterus.       Right eye: No discharge.        Left eye: No discharge.     Extraocular Movements: Extraocular movements intact.     Pupils: Pupils are equal, round, and reactive to light.  Cardiovascular:     Rate and Rhythm: Normal rate and regular rhythm.     Pulses: Normal pulses.     Heart sounds: Normal heart sounds. No murmur heard.    No friction rub. No gallop.  Pulmonary:     Effort: Pulmonary effort is normal. No respiratory distress.     Breath sounds: Normal breath sounds.  Abdominal:     General: Abdomen is flat. Bowel sounds are normal. There is no distension.     Palpations: Abdomen is soft.     Tenderness: There is no abdominal tenderness.     Comments: Abdomen is soft and nontender.  Genitourinary:    Vagina: Normal.     Cervix: Normal.     Uterus: Normal.      Adnexa: Right adnexa normal and left adnexa normal.     Comments: Pelvic performed with tech  Jaclyn as Producer, television/film/video.  No cervical motion tenderness, opaque discharge.  No cervical friability. Skin:    General: Skin is warm and dry.     Coloration: Skin is not jaundiced.  Neurological:     Mental Status: She is alert. Mental status is at baseline.     Coordination: Coordination normal.     ED Results / Procedures / Treatments   Labs (all labs ordered are listed, but only abnormal results are displayed) Labs Reviewed  WET PREP, GENITAL  SARS CORONAVIRUS 2 BY RT PCR  URINALYSIS, ROUTINE W REFLEX MICROSCOPIC  CBC WITH DIFFERENTIAL/PLATELET  COMPREHENSIVE METABOLIC PANEL  LIPASE, BLOOD  I-STAT BETA HCG BLOOD, ED ([facility], [facility], [facility] ONLY)  GC/CHLAMYDIA PROBE AMP ([place]) NOT AT [facility]    EKG None  Radiology No results found.  Procedures Procedures  {Document  cardiac monitor, telemetry assessment procedure when appropriate:1}  Medications Ordered in ED Medications  hydrOXYzine (ATARAX) tablet 25 mg (25 mg Oral Given 10/28/22 1657)  ibuprofen (ADVIL) tablet 800 mg (800 mg Oral Given 10/28/22 1658)    ED Course/ Medical Decision Making/ A&P   {   Click here for ABCD2, HEART and other calculatorsREFRESH Note before signing :1}                          Medical Decision Making Amount and/or Complexity of Data Reviewed Labs: ordered. Radiology: ordered.  Risk Prescription drug management.   This is a 37 year old female with history of C-section, anxiety, presenting to the emergency department due to vaginal pain and discharge.  Differential includes PID, UTI, STD, cervicitis, vaginitis.    Regarding the headache, she has a very benign neuroexam without any appreciable focal deficits.  Is not sudden onset, no risk factors for pseudotumor cerebri and I do not think this is SAH.  There is no nuchal rigidity, afebrile and I do not suspect meningitis.  Suspect secondary to viral process, will recommend following up with PCP regarding the headache, I do not see any indication for neuroimaging.  She is not having any abdominal pain, no upper abdominal pain.  Suspect GU etiology.  Will start with labs, pelvic exam and STD testing then we will will evaluate.  Also considered TOA but afebrile, no SIRS criteria not septic so again seems less likely.    Pelvic exam is without any adnexal tenderness, however wet prep is negative for BV.  Patient is still having pelvic pain so we will proceed with ultrasound to evaluate for mass, cyst, torsion.  Laboratory workup is currently pending, as long as the pelvic ultrasound is negative patient is negative for UTI appropriate for outpatient follow-up with PCP.  Do not see indication for additional imaging or CT given no upper abdominal tenderness.  Disposition is pending ultrasound results and laboratory workup.  Case  discussed with PA Hoyle Sauer who is assuming care.   {Document critical care time when appropriate:1} {Document review of labs and clinical decision tools ie heart score, Chads2Vasc2 etc:1}  {Document your independent review of radiology images, and any outside records:1} {Document your discussion with family members, caretakers, and with consultants:1} {Document social determinants of health affecting pt's care:1} {Document your decision making why or why not admission, treatments were needed:1} Final Clinical Impression(s) / ED Diagnoses Final diagnoses:  None    Rx / DC Orders ED Discharge Orders     None

## 2022-10-28 NOTE — ED Provider Notes (Signed)
Care assumed from [facility], [place] at shift change pending labs and pelvic ultrasound.  See her note for full HPI.  In short, patient is a 37 year old female who presents to the ED due to pelvic pain x 6 days associated with vaginal discharge and increased urinary frequency.  Patient also endorsing a headache.   Physical Exam  BP (!) 164/11   Pulse 87   Temp 98.3 F (36.8 C) (Oral)   Resp 18   Ht 5\' 4"  (1.626 m)   Wt 123.8 kg   LMP 08/23/2022 (Approximate)   SpO2 97%   BMI 46.85 kg/m   Physical Exam Vitals and nursing note reviewed.  Constitutional:      General: She is not in acute distress.    Appearance: She is not ill-appearing.  HENT:     Head: Normocephalic.  Eyes:     Pupils: Pupils are equal, round, and reactive to light.  Cardiovascular:     Rate and Rhythm: Normal rate and regular rhythm.     Pulses: Normal pulses.     Heart sounds: Normal heart sounds. No murmur heard.    No friction rub. No gallop.  Pulmonary:     Effort: Pulmonary effort is normal.     Breath sounds: Normal breath sounds.  Abdominal:     General: Abdomen is flat. There is no distension.     Palpations: Abdomen is soft.     Tenderness: There is no abdominal tenderness. There is no guarding or rebound.  Genitourinary:    Comments: Pelvic exam performed by previous provider. Musculoskeletal:        General: Normal range of motion.     Cervical back: Neck supple.  Skin:    General: Skin is warm and dry.  Neurological:     General: No focal deficit present.     Mental Status: She is alert.  Psychiatric:        Mood and Affect: Mood normal.        Behavior: Behavior normal.     Procedures  Procedures  ED Course / MDM    Medical Decision Making Amount and/or Complexity of Data Reviewed Labs: ordered. Decision-making details documented in ED Course. Radiology: ordered and independent interpretation performed. Decision-making details documented in ED Course.  Risk Prescription  drug management.    Care assumed from [facility], [place] at shift change pending labs and pelvic ultrasound.  See her note for full MDM.  Pelvic ultrasound personally reviewed which demonstrates a 1.4 cm uterine fibroid.  No evidence of ovarian mass or torsion.  Wet prep negative.  Doubt vaginitis.  AST and ALT elevated which appears chronic in nature.  No right upper quadrant tenderness to suggest acute cholecystitis.  Advised patient have repeat LFTs in 1 week.  Pregnancy test negative.  Doubt ectopic pregnancy.  Lipase normal at 23.  Doubt pancreatitis.  8:17 PM reassessed patient at bedside.  Patient admits to improvement in headache and lightheadedness after IV fluids.  Patient resting comfortably in bed.  No right lower quadrant tenderness to suggest appendicitis.  Pain localized to suprapubic region.  Awaiting CBC, COVID test, and UA.   CBC reassuring.  No leukocytosis.  Normal hemoglobin.  UA significant for small hematuria.  No signs of infection.  COVID-negative.  9:08 PM admits to continued pelvic pain. Tylenol given.  Suspect symptoms related to fibroid.  Previous provider performed pelvic exam and had low suspicion for PID.  Wet prep negative.  Gonorrhea/chlamydia pending however, patient admits  to low suspicion of STIs.  Will hold treatment at this time.  Patient referred to OB/GYN for further evaluation of fibroids and GI for transaminitis. Patient stable for discharge. Strict ED precautions discussed with patient. Patient states understanding and agrees to plan. Patient discharged home in no acute distress and stable vitals         Karie Kirks 10/28/22 2111    Drenda Freeze, MD 10/28/22 [phone]

## 2022-10-28 NOTE — ED Notes (Signed)
Discharge instructions discussed with pt. Verbalized understanding. VSS. No questions or concerns regarding discharge  

## 2022-10-28 NOTE — Telephone Encounter (Signed)
Unable to refill per protocol, Rx request is too soon. Kenalog was discontinued, not on current list.  Requested Prescriptions  Pending Prescriptions Disp Refills   ferrous sulfate 325 (65 FE) MG tablet 90 tablet 1    Sig: Take 1 tablet (325 mg total) by mouth daily with breakfast.     Endocrinology:  Minerals - Iron Supplementation Failed - 10/27/2022  1:03 PM      Failed - Fe (serum) in normal range and within 360 days    Iron  Date Value Ref Range Status  11/19/2020 61 27 - 159 ug/dL Final   Iron Saturation  Date Value Ref Range Status  11/19/2020 16 15 - 55 % Final         Failed - Ferritin in normal range and within 360 days    Ferritin  Date Value Ref Range Status  11/19/2020 64 15 - 150 ng/mL Final         Passed - HGB in normal range and within 360 days    Hemoglobin  Date Value Ref Range Status  10/03/2022 13.3 12.0 - 15.0 g/dL Final  05/15/2022 8.3 (L) 11.1 - 15.9 g/dL Final         Passed - HCT in normal range and within 360 days    HCT  Date Value Ref Range Status  10/03/2022 40.4 36.0 - 46.0 % Final   Hematocrit  Date Value Ref Range Status  05/15/2022 25.2 (L) 34.0 - 46.6 % Final         Passed - RBC in normal range and within 360 days    RBC  Date Value Ref Range Status  10/03/2022 4.81 3.87 - 5.11 MIL/uL Final         Passed - Valid encounter within last 12 months    Recent Outpatient Visits           5 months ago GAD (generalized anxiety disorder)   [place] [facility] Kerin Perna, NP   9 months ago Subacute maxillary sinusitis   [place] [facility] Kerin Perna, NP   1 year ago Encounter to establish care   [facility] Kerin Perna, NP       Future Appointments             In 1 week Kerin Perna, NP  [facility]             valsartan-hydrochlorothiazide (DIOVAN-HCT) 160-25 MG tablet 90 tablet 3    Sig: Take  1 tablet by mouth daily.     Cardiovascular: ARB + Diuretic Combos Failed - 10/27/2022  1:03 PM      Failed - Na in normal range and within 180 days    Sodium  Date Value Ref Range Status  10/03/2022 134 (L) 135 - 145 mmol/L Final  05/15/2022 135 134 - 144 mmol/L Final         Passed - K in normal range and within 180 days    Potassium  Date Value Ref Range Status  10/03/2022 3.5 3.5 - 5.1 mmol/L Final         Passed - Cr in normal range and within 180 days    Creatinine, Ser  Date Value Ref Range Status  10/03/2022 0.82 0.44 - 1.00 mg/dL Final         Passed - eGFR is 10 or above and within 180 days    GFR calc Af Wyvonnia Lora  Date Value Ref Range Status  08/09/2020 130 >59 mL/min/1.73 Final    Comment:    **In accordance with recommendations from the NKF-ASN Task force,**   [facility] is in the process of updating its eGFR calculation to the   2021 CKD-EPI creatinine equation that estimates kidney function   without a race variable.    GFR, Estimated  Date Value Ref Range Status  10/03/2022 >60 >60 mL/min Final    Comment:    (NOTE) Calculated using the CKD-EPI Creatinine Equation (2021)    eGFR  Date Value Ref Range Status  05/15/2022 107 >59 mL/min/1.73 Final         Passed - Patient is not pregnant      Passed - Last BP in normal range    BP Readings from Last 1 Encounters:  10/03/22 117/82         Passed - Valid encounter within last 6 months    Recent Outpatient Visits           5 months ago GAD (generalized anxiety disorder)   [place] [facility] Kerin Perna, NP   9 months ago Subacute maxillary sinusitis   [place] [facility] Kerin Perna, NP   1 year ago Encounter to establish care   [place], Michelle P, NP       Future Appointments             In 1 week Kerin Perna, NP [place] [facility]             triamcinolone  ointment (KENALOG) 0.5 % 45 g 1    Sig: Apply 1 Application topically 2 (two) times daily.     Not Delegated - Dermatology:  Corticosteroids Failed - 10/27/2022  1:03 PM      Failed - This refill cannot be delegated      Passed - Valid encounter within last 12 months    Recent Outpatient Visits           5 months ago GAD (generalized anxiety disorder)   [place] [facility] Kerin Perna, NP   9 months ago Subacute maxillary sinusitis   [place] [facility] Kerin Perna, NP   1 year ago Encounter to establish care   [place], [place], NP       Future Appointments             In 1 week Oletta Lamas [place] Cage, NP [place]

## 2022-10-28 NOTE — Discharge Instructions (Addendum)
Take Bentyl as needed for abdominal pain every 12 hours.  Intake Tylenol and Motrin for the headache.  Follow-up with your gynecologist regarding the discharge, the wet prep was negative for BV or yeast.  Using clinical take a few days to result.  Please have your liver enzymes rechecked in 1 week.  I have included the number of the GI doctor.  Please call to schedule appointment for further evaluation.

## 2022-10-30 LAB — GC/CHLAMYDIA PROBE AMP (~~LOC~~) NOT AT ARMC
Comment: NEGATIVE
Comment: NORMAL

## 2022-11-01 ENCOUNTER — Telehealth: Payer: Medicaid Other | Admitting: Family

## 2022-11-01 DIAGNOSIS — J301 Allergic rhinitis due to pollen: Secondary | ICD-10-CM | POA: Diagnosis not present

## 2022-11-01 DIAGNOSIS — R519 Headache, unspecified: Secondary | ICD-10-CM

## 2022-11-01 MED ORDER — FLUTICASONE PROPIONATE 50 MCG/ACT NA SUSP: 2.0000 | Freq: Every day | NASAL | 6 refills | Status: DC

## 2022-11-01 MED ORDER — CETIRIZINE HCL 10 MG PO TABS: 10.0000 mg | ORAL_TABLET | Freq: Every day | ORAL | 1 refills | Status: DC

## 2022-11-01 NOTE — Progress Notes (Signed)
Virtual Visit Consent   Paula Giles, you are scheduled for a virtual visit with a [facility] provider today. Just as with appointments in the office, your consent must be obtained to participate. Your consent will be active for this visit and any virtual visit you may have with one of our providers in the next 365 days. If you have a MyChart account, a copy of this consent can be sent to you electronically.  As this is a virtual visit, video technology does not allow for your provider to perform a traditional examination. This may limit your provider's ability to fully assess your condition. If your provider identifies any concerns that need to be evaluated in person or the need to arrange testing (such as labs, EKG, etc.), we will make arrangements to do so. Although advances in technology are sophisticated, we cannot ensure that it will always work on either your end or our end. If the connection with a video visit is poor, the visit may have to be switched to a telephone visit. With either a video or telephone visit, we are not always able to ensure that we have a secure connection.  By engaging in this virtual visit, you consent to the provision of healthcare and authorize for your insurance to be billed (if applicable) for the services provided during this visit. Depending on your insurance coverage, you may receive a charge related to this service.  I need to obtain your verbal consent now. Are you willing to proceed with your visit today? Paula Giles has provided verbal consent on 11/01/2022 for a virtual visit (video or telephone). Evelina Dun, FNP  Date: 11/01/2022 3:04 PM  Virtual Visit via Video Note   I, Evelina Dun, connected with  Paula Giles  ([id number], 06/25/1986) on 11/01/22 at  3:00 PM EDT by a video-enabled telemedicine application and verified that I am speaking with the correct person using two identifiers.  Location: Patient: Virtual Visit Location Patient:  work Provider: Ecologist: Home Office   I discussed the limitations of evaluation and management by telemedicine and the availability of in person appointments. The patient expressed understanding and agreed to proceed.    History of Present Illness: Paula Giles is a 37 y.o. who identifies as a female who was assigned female at birth, and is being seen today for congestion and headache.  HPI: Headache  Associated symptoms include coughing, ear pain and sinus pressure. Pertinent negatives include no sore throat.  Sinusitis This is a new problem. The current episode started 1 to 4 weeks ago. The problem is unchanged. There has been no fever. Her pain is at a severity of 8/10. The pain is mild. Associated symptoms include congestion, coughing, ear pain, headaches, sinus pressure and sneezing. Pertinent negatives include no chills, shortness of breath or sore throat. Past treatments include acetaminophen. The treatment provided mild relief.    Problems:  Patient Active Problem List   Diagnosis Date Noted   Elevated LFTs 11/19/2020   Vaginal discharge 11/19/2020   Dysuria 11/19/2020   Hypertension 08/09/2020   Normocytic anemia 08/09/2020   Pre-diabetes 08/09/2020   Obesity 08/09/2020   Hematuria 08/09/2020   Generalized anxiety disorder with panic attacks 08/09/2020   MDD (major depressive disorder) 08/09/2020   Abnormal uterine bleeding 08/09/2020    Allergies:  Allergies  Allergen Reactions   Latex Rash   Medications:  Current Outpatient Medications:    cetirizine (ZYRTEC ALLERGY) 10 MG tablet, Take 1 tablet (  10 mg total) by mouth daily., Disp: 90 tablet, Rfl: 1   fluticasone (FLONASE) 50 MCG/ACT nasal spray, Place 2 sprays into both nostrils daily., Disp: 16 g, Rfl: 6   dicyclomine (BENTYL) 20 MG tablet, Take 1 tablet (20 mg total) by mouth 2 (two) times daily., Disp: 20 tablet, Rfl: 0   ferrous sulfate 325 (65 FE) MG tablet, Take 1 tablet (325 mg total)  by mouth daily with breakfast., Disp: 90 tablet, Rfl: 1   valsartan-hydrochlorothiazide (DIOVAN-HCT) 160-25 MG tablet, Take 1 tablet by mouth daily., Disp: 90 tablet, Rfl: 3  Observations/Objective: Patient is well-developed, well-nourished in no acute distress.  Resting comfortably  at home.  Head is normocephalic, atraumatic.  No labored breathing.  Speech is clear and coherent with logical content.  Patient is alert and oriented at baseline.    Assessment and Plan: 1. Allergic rhinitis due to pollen, unspecified seasonality - cetirizine (ZYRTEC ALLERGY) 10 MG tablet; Take 1 tablet (10 mg total) by mouth daily.  Dispense: 90 tablet; Refill: 1 - fluticasone (FLONASE) 50 MCG/ACT nasal spray; Place 2 sprays into both nostrils daily.  Dispense: 16 g; Refill: 6  2. Nonintractable headache, unspecified chronicity pattern, unspecified headache type - cetirizine (ZYRTEC ALLERGY) 10 MG tablet; Take 1 tablet (10 mg total) by mouth daily.  Dispense: 90 tablet; Refill: 1 - fluticasone (FLONASE) 50 MCG/ACT nasal spray; Place 2 sprays into both nostrils daily.  Dispense: 16 g; Refill: 6  Start zyrtec and Flonase  - Take meds as prescribed - Use a cool mist humidifier  -Use saline nose sprays frequently -Force fluids -For any cough or congestion  Use plain Mucinex- regular strength or max strength is fine -For fever or aces or pains- take tylenol or ibuprofen. -Throat lozenges if help -Follow up if symptoms worsen or do not improve   Follow Up Instructions: I discussed the assessment and treatment plan with the patient. The patient was provided an opportunity to ask questions and all were answered. The patient agreed with the plan and demonstrated an understanding of the instructions.  A copy of instructions were sent to the patient via MyChart unless otherwise noted below.   Patient has requested to receive PHI (AVS, Work Notes, etc) pertaining to this video visit through e-mail as they are  currently without active [place]. They have voiced understand that email is not considered secure and their health information could be viewed by someone other than the patient.   The patient was advised to call back or seek an in-person evaluation if the symptoms worsen or if the condition fails to improve as anticipated.  Time:  I spent 9 minutes with the patient via telehealth technology discussing the above problems/concerns.    Evelina Dun, FNP

## 2022-11-10 ENCOUNTER — Ambulatory Visit (INDEPENDENT_AMBULATORY_CARE_PROVIDER_SITE_OTHER): Payer: Medicaid Other | Admitting: Primary Care

## 2022-12-15 ENCOUNTER — Ambulatory Visit: Payer: Medicaid Other | Admitting: Physician Assistant

## 2022-12-15 ENCOUNTER — Telehealth (INDEPENDENT_AMBULATORY_CARE_PROVIDER_SITE_OTHER): Payer: Self-pay | Admitting: Primary Care

## 2022-12-15 NOTE — Telephone Encounter (Signed)
forward

## 2022-12-15 NOTE — Telephone Encounter (Signed)
Pt is calling to see if Paula Giles could assist her to reschedule her GI appt sooner than July 2024 Please advise CB- [phone]

## 2022-12-16 NOTE — Telephone Encounter (Signed)
Returned pt call. Pt didn't answer lvm making pt aware that she will need to wait till appt with Paula Giles to place referral or she can wait till her appt for July and if she has any questions or concerns to give [place] a call.

## 2022-12-18 ENCOUNTER — Ambulatory Visit (INDEPENDENT_AMBULATORY_CARE_PROVIDER_SITE_OTHER): Payer: Self-pay

## 2022-12-18 NOTE — Telephone Encounter (Signed)
Patient called, left VM to return the call to the office to discuss symptoms with a nurse.  Summary: Heavy Menses   Patient states she has been on her menstrual cycle for a little over a month and it is very heavy and affecting her job performance.

## 2022-12-18 NOTE — Telephone Encounter (Signed)
Chief Complaint: prolonged menstrual cycle Symptoms: bleeding- heavy/clots- started 11/15/22- patient reports flow will slow but does not stop- then restarts heavy again, patient does report she is feeling week  Frequency: patient reports presently she is changing pad every hour Pertinent Negatives: Patient denies pain- other symptoms Disposition: [] ED /[x] Urgent Care (no appt availability in office) / [] Appointment(In office/virtual)/ []  [facility]/ [] Home Care/ [] Refused Recommended Disposition /[] [facility]/ []  Follow-up with PCP Additional Notes: Call to provider office- patient does have referral to GYN office and PCP has  ne everything she can do at this point. Advised me to call GYN office to see if patient can be seen sooner. Called GYN office- the are presently in meeting- information regarding the patient was given and they will address. Patient advised she may get call back- please seek care at [facility] for her active bleeding.   Reason for Disposition  MODERATE vaginal bleeding (e.g., soaking 1 pad or tampon per hour and present > 6 hours; 1 menstrual cup every 6 hours)  Answer Assessment - Initial Assessment Questions 1. AMOUNT: "Describe the bleeding that you are having."    - SPOTTING: spotting, or pinkish / brownish mucous discharge; does not fill panty liner or pad    - MILD:  less than 1 pad / hour; less than patient's usual menstrual bleeding   - MODERATE: 1-2 pads / hour; 1 menstrual cup every 6 hours; small-medium blood clots (e.g., pea, grape, small coin)   - SEVERE: soaking 2 or more pads/hour for 2 or more hours; 1 menstrual cup every 2 hours; bleeding not contained by pads or continuous red blood from vagina; large blood clots (e.g., golf ball, large coin)      Heavy with clots- changing pads every hour 2. ONSET: "When did the bleeding begin?" "Is it continuing now?"     11/15/22- every day- may taper- but comes back 3. MENSTRUAL PERIOD: "When was the  last normal menstrual period?" "How is this different than your period?"     Patient has history of heavy prolonged bleeding- it has been a while- last normal cycle last month 4. REGULARITY: "How regular are your periods?"     every month 5. ABDOMEN PAIN: "Do you have any pain?" "How bad is the pain?"  (e.g., Scale 1-10; mild, moderate, or severe)   - MILD (1-3): doesn't interfere with normal activities, abdomen soft and not tender to touch    - MODERATE (4-7): interferes with normal activities or awakens from sleep, abdomen tender to touch    - SEVERE (8-10): excruciating pain, doubled over, unable to do any normal activities      Patient was seen last month for pain- fibroids  6. PREGNANCY: "Is there any chance you are pregnant?" "When was your last menstrual period?"     No- not sexually active 7. BREASTFEEDING: "Are you breastfeeding?"     no 8. HORMONE MEDICINES: "Are you taking any hormone medicines, prescription or over-the-counter?" (e.g., birth control pills, estrogen)     no 9. BLOOD THINNER MEDICINES: "Do you take any blood thinners?" (e.g., Coumadin / warfarin, Pradaxa / dabigatran, aspirin)     no 10. CAUSE: "What do you think is causing the bleeding?" (e.g., recent gyn surgery, recent gyn procedure; known bleeding disorder, cervical cancer, polycystic ovarian disease, fibroids)         Unsure- hx fibroids  11. HEMODYNAMIC STATUS: "Are you weak or feeling lightheaded?" If Yes, ask: "Can you stand and walk normally?"  Weakness-yes 12. OTHER SYMPTOMS: "What other symptoms are you having with the bleeding?" (e.g., passed tissue, vaginal discharge, fever, menstrual-type cramps)       no  Protocols used: Vaginal Bleeding - Abnormal-A-AH

## 2022-12-24 ENCOUNTER — Ambulatory Visit (INDEPENDENT_AMBULATORY_CARE_PROVIDER_SITE_OTHER): Payer: Medicaid Other | Admitting: Primary Care

## 2022-12-30 ENCOUNTER — Encounter: Payer: Medicaid Other | Admitting: Obstetrics and Gynecology

## 2023-01-05 ENCOUNTER — Ambulatory Visit (INDEPENDENT_AMBULATORY_CARE_PROVIDER_SITE_OTHER): Payer: Medicaid Other | Admitting: Primary Care

## 2023-01-28 ENCOUNTER — Encounter: Payer: Medicaid Other | Admitting: Obstetrics and Gynecology

## 2023-02-09 ENCOUNTER — Encounter (INDEPENDENT_AMBULATORY_CARE_PROVIDER_SITE_OTHER): Payer: Medicaid Other | Admitting: Primary Care

## 2023-02-17 ENCOUNTER — Other Ambulatory Visit (INDEPENDENT_AMBULATORY_CARE_PROVIDER_SITE_OTHER): Payer: Self-pay

## 2023-02-17 ENCOUNTER — Ambulatory Visit (INDEPENDENT_AMBULATORY_CARE_PROVIDER_SITE_OTHER): Payer: Medicaid Other | Admitting: Primary Care

## 2023-02-17 ENCOUNTER — Encounter (INDEPENDENT_AMBULATORY_CARE_PROVIDER_SITE_OTHER): Payer: Self-pay | Admitting: Primary Care

## 2023-02-17 VITALS — BP 126/84 | HR 101 | Resp 16 | Ht 64.0 in | Wt 274.6 lb

## 2023-02-17 DIAGNOSIS — R300 Dysuria: Secondary | ICD-10-CM

## 2023-02-17 DIAGNOSIS — D649 Anemia, unspecified: Secondary | ICD-10-CM

## 2023-02-17 DIAGNOSIS — R7989 Other specified abnormal findings of blood chemistry: Secondary | ICD-10-CM

## 2023-02-17 DIAGNOSIS — F4323 Adjustment disorder with mixed anxiety and depressed mood: Secondary | ICD-10-CM

## 2023-02-17 MED ORDER — BUSPIRONE HCL 5 MG PO TABS: 5.0000 mg | ORAL_TABLET | Freq: Two times a day (BID) | ORAL | 1 refills | Status: DC

## 2023-02-17 MED ORDER — FERROUS SULFATE 325 (65 FE) MG PO TABS: 325.0000 mg | ORAL_TABLET | Freq: Every day | ORAL | 1 refills | Status: DC

## 2023-02-17 NOTE — Addendum Note (Signed)
Addended by: Herbert Deaner on: 02/17/2023 04:53 PM   Modules accepted: Orders

## 2023-02-17 NOTE — Progress Notes (Signed)
[facility]  Paula Giles, is a 37 y.o. female  [phone]  WJX:[id number]  DOB - February 16, 1986  Chief Complaint  Patient presents with   Annual Exam   Back Pain    Lower    Pelvic Pain       Subjective:   Paula Giles is a 37 y.o. female here today for concern about mood changes and overall feeling not well. The last 3-6 months she has been experiencing increase anxiety - she shuts down unable to get motivated to get out of the bed. Decrease appetite and when she does eats she splurges not a alcoholic but a glass of red wine help sometime to rest better. She may have 3-4 drinks a week. Insomnia due to pruritus. Dr. Waverly Ferrari feels that her  pruritus is due to her liver and would like labs.  Patient has No headache, No chest pain, No abdominal pain - No Nausea, No new weakness tingling or numbness, No Cough - shortness of breath  No problems updated.  Allergies  Allergen Reactions   Latex Rash    Past Medical History:  Diagnosis Date   Anxiety    BV (bacterial vaginosis)    Irregular menstrual bleeding    Mood disorder (HCC)    Seasonal allergies     Current Outpatient Medications on File Prior to Visit  Medication Sig Dispense Refill   ferrous sulfate 325 (65 FE) MG tablet Take 1 tablet (325 mg total) by mouth daily with breakfast. 90 tablet 1   fluticasone (FLONASE) 50 MCG/ACT nasal spray Place 2 sprays into both nostrils daily. 16 g 6   valsartan-hydrochlorothiazide (DIOVAN-HCT) 160-25 MG tablet Take 1 tablet by mouth daily. 90 tablet 3   cetirizine (ZYRTEC ALLERGY) 10 MG tablet Take 1 tablet (10 mg total) by mouth daily. (Patient not taking: Reported on 02/17/2023) 90 tablet 1   dicyclomine (BENTYL) 20 MG tablet Take 1 tablet (20 mg total) by mouth 2 (two) times daily. (Patient not taking: Reported on 02/17/2023) 20 tablet 0   No current facility-administered medications on file prior to visit.    Objective:   Vitals:   02/17/23 1413  BP: 126/84   Pulse: (Abnormal) 101  Resp: 16  SpO2: 99%  Weight: 274 lb 9.6 oz (124.6 kg)  Height: 5\' 4"  (1.626 m)    Comprehensive ROS Pertinent positive and negative noted in HPI   Exam General appearance : Awake, alert, not in any distress. Speech Clear. Not toxic looking HEENT: Atraumatic and Normocephalic, pupils equally reactive to light and accomodation Neck: Supple, no JVD. No cervical lymphadenopathy.  Chest: Good air entry bilaterally, no added sounds  CVS: S1 S2 regular, no murmurs.  Abdomen: Bowel sounds present, Non tender and not distended with no gaurding, rigidity or rebound. Extremities: B/L Lower Ext shows no edema, both legs are warm to touch Neurology: Awake alert, and oriented X 3, CN II-XII intact, Non focal Skin: No Rash  Data Review Lab Results  Component Value Date   HGBA1C 5.5 05/15/2022   HGBA1C 5.3 07/02/2021   HGBA1C 5.7 (A) 08/09/2020    Assessment & Plan   Paula Giles was seen today for annual exam, back pain and pelvic pain.  Diagnoses and all orders for this visit:  Adjustment disorder with mixed anxiety and depressed mood See HPI  -     busPIRone (BUSPAR) 5 MG tablet; Take 1 tablet (5 mg total) by mouth 2 (two) times daily.  Normocytic anemia -     CBC  with Differential  Morbidly obese (HCC) Discussed diet and exercise for person with BMI >25. Instructed: You must burn more calories than you eat. Losing 5 percent of your body weight should be considered a success. In the longer term, losing more than 15 percent of your body weight and staying at this weight is an extremely good result. However, keep in mind that even losing 5 percent of your body weight leads to important health benefits, so try not to get discouraged if you're not able to lose more than this. Will recheck weight in 3-6 months.   Elevated LFTs -     CMP14+EGFR -     Lipid Panel      Patient have been counseled extensively about nutrition and exercise. Other issues discussed during  this visit include: low cholesterol diet, weight control and daily exercise, foot care, annual eye examinations at Ophthalmology, importance of adherence with medications and regular follow-up. We also discussed long term complications of uncontrolled diabetes and hypertension.   No follow-ups on file.  The patient was given clear instructions to go to ER or return to medical center if symptoms don't improve, worsen or new problems develop. The patient verbalized understanding. The patient was told to call to get lab results if they haven't heard anything in the next week.   This note has been created with Education officer, environmental. Any transcriptional errors are unintentional.   Grayce Sessions, NP 02/17/2023, 2:31 PM

## 2023-02-17 NOTE — Patient Instructions (Signed)
Buspirone Tablets What is this medication? BUSPIRONE (byoo SPYE rone) treats anxiety. It works by balancing the levels of dopamine and serotonin in your brain, substances that help regulate mood. This medicine may be used for other purposes; ask your health care provider or pharmacist if you have questions. COMMON BRAND NAME(S): BuSpar, Buspar Dividose What should I tell my care team before I take this medication? They need to know if you have any of these conditions: Kidney or liver disease An unusual or allergic reaction to buspirone, other medications, foods, dyes, or preservatives Pregnant or trying to get pregnant Breast-feeding How should I use this medication? Take this medication by mouth with a glass of water. Follow the directions on the prescription label. You may take this medication with or without food. To ensure that this medication always works the same way for you, you should take it either always with or always without food. Take your doses at regular intervals. Do not take your medication more often than directed. Do not stop taking except on the advice of your care team. Talk to your care team about the use of this medication in children. Special care may be needed. Overdosage: If you think you have taken too much of this medicine contact a poison control center or emergency room at once. NOTE: This medicine is only for you. Do not share this medicine with others. What if I miss a dose? If you miss a dose, take it as soon as you can. If it is almost time for your next dose, take only that dose. Do not take double or extra doses. What may interact with this medication? Do not take this medication with any of the following: Linezolid MAOIs like Carbex, Eldepryl, Marplan, Nardil, and Parnate Methylene blue Procarbazine This medication may also interact with the following: Diazepam Digoxin Diltiazem Erythromycin Grapefruit juice Haloperidol Medications for mental  depression or mood problems Medications for seizures like carbamazepine, phenobarbital and phenytoin Nefazodone Other medications for anxiety Rifampin Ritonavir Some antifungal medications like itraconazole, ketoconazole, and voriconazole Verapamil Warfarin This list may not describe all possible interactions. Give your health care provider a list of all the medicines, herbs, non-prescription drugs, or dietary supplements you use. Also tell them if you smoke, drink alcohol, or use illegal drugs. Some items may interact with your medicine. What should I watch for while using this medication? Visit your care team for regular checks on your progress. It may take 1 to 2 weeks before your anxiety gets better. This medication may affect your coordination, reaction time, or judgment. Do not drive or operate machinery until you know how this medication affects you. Sit up or stand slowly to reduce the risk of dizzy or fainting spells. Drinking alcohol with this medication can increase the risk of these side effects. What side effects may I notice from receiving this medication? Side effects that you should report to your care team as soon as possible: Allergic reactions--skin rash, itching, hives, swelling of the face, lips, tongue, or throat Irritability, confusion, fast or irregular heartbeat, muscle stiffness, twitching muscles, sweating, high fever, seizure, chills, vomiting, diarrhea, which may be signs of serotonin syndrome Side effects that usually do not require medical attention (report to your care team if they continue or are bothersome): Anxiety, nervousness Dizziness Drowsiness Headache Nausea Trouble sleeping This list may not describe all possible side effects. Call your doctor for medical advice about side effects. You may report side effects to FDA at 1-800-FDA-1088. Where should I keep   my medication? Keep out of the reach of children. Store at room temperature below 30 degrees C  (86 degrees F). Protect from light. Keep container tightly closed. Throw away any unused medication after the expiration date. NOTE: This sheet is a summary. It may not cover all possible information. If you have questions about this medicine, talk to your doctor, pharmacist, or health care provider.  2024 Elsevier/Gold Standard (2022-02-10 00:00:00)  

## 2023-02-17 NOTE — Addendum Note (Signed)
Addended by: Herbert Deaner on: 02/17/2023 03:28 PM   Modules accepted: Orders

## 2023-02-18 LAB — CMP14+EGFR
ALT: 73 IU/L — ABNORMAL HIGH (ref 0–32)
AST: 90 IU/L — ABNORMAL HIGH (ref 0–40)
Albumin: 4.2 g/dL (ref 3.9–4.9)
Alkaline Phosphatase: 67 IU/L (ref 44–121)
BUN/Creatinine Ratio: 13 (ref 9–23)
BUN: 10 mg/dL (ref 6–20)
Bilirubin Total: 0.3 mg/dL (ref 0.0–1.2)
CO2: 25 mmol/L (ref 20–29)
Calcium: 9.5 mg/dL (ref 8.7–10.2)
Chloride: 99 mmol/L (ref 96–106)
Creatinine, Ser: 0.77 mg/dL (ref 0.57–1.00)
Globulin, Total: 3.5 g/dL (ref 1.5–4.5)
Glucose: 82 mg/dL (ref 70–99)
Potassium: 4 mmol/L (ref 3.5–5.2)
Sodium: 138 mmol/L (ref 134–144)
Total Protein: 7.7 g/dL (ref 6.0–8.5)
eGFR: 102 mL/min/{1.73_m2} (ref >59)

## 2023-02-18 LAB — CBC WITH DIFFERENTIAL/PLATELET
Basophils Absolute: 0.1 10*3/uL (ref 0.0–0.2)
Basos: 1 %
EOS (ABSOLUTE): 0.2 10*3/uL (ref 0.0–0.4)
Eos: 3 %
Hematocrit: 28.2 % — ABNORMAL LOW (ref 34.0–46.6)
Hemoglobin: 8.2 g/dL — ABNORMAL LOW (ref 11.1–15.9)
Immature Grans (Abs): 0 10*3/uL (ref 0.0–0.1)
Immature Granulocytes: 0 %
Lymphocytes Absolute: 2 10*3/uL (ref 0.7–3.1)
Lymphs: 26 %
MCH: 24.6 pg — ABNORMAL LOW (ref 26.6–33.0)
MCHC: 29.1 g/dL — ABNORMAL LOW (ref 31.5–35.7)
MCV: 84 fL (ref 79–97)
Monocytes Absolute: 0.6 10*3/uL (ref 0.1–0.9)
Monocytes: 8 %
Neutrophils Absolute: 4.5 10*3/uL (ref 1.4–7.0)
Neutrophils: 62 %
Platelets: 366 10*3/uL (ref 150–450)
RBC: 3.34 x10E6/uL — ABNORMAL LOW (ref 3.77–5.28)
RDW: 16.4 % — ABNORMAL HIGH (ref 11.7–15.4)
WBC: 7.4 10*3/uL (ref 3.4–10.8)

## 2023-02-18 LAB — URINALYSIS, COMPLETE
Bilirubin, UA: NEGATIVE
Glucose, UA: NEGATIVE
Ketones, UA: NEGATIVE
Leukocytes,UA: NEGATIVE
Nitrite, UA: NEGATIVE
Protein,UA: NEGATIVE
RBC, UA: NEGATIVE
Specific Gravity, UA: 1.014 (ref 1.005–1.030)
Urobilinogen, Ur: 0.2 mg/dL (ref 0.2–1.0)
pH, UA: 6.5 (ref 5.0–7.5)

## 2023-02-18 LAB — MICROSCOPIC EXAMINATION
Casts: NONE SEEN /lpf
RBC, Urine: NONE SEEN /hpf (ref 0–2)

## 2023-02-18 LAB — LIPID PANEL
Chol/HDL Ratio: 3.2 ratio (ref 0.0–4.4)
Cholesterol, Total: 149 mg/dL (ref 100–199)
HDL: 47 mg/dL (ref >39)
LDL Chol Calc (NIH): 91 mg/dL (ref 0–99)
Triglycerides: 51 mg/dL (ref 0–149)
VLDL Cholesterol Cal: 11 mg/dL (ref 5–40)

## 2023-02-18 LAB — TSH+FREE T4
Free T4: 1.25 ng/dL (ref 0.82–1.77)
TSH: 1.24 u[IU]/mL (ref 0.450–4.500)

## 2023-02-23 ENCOUNTER — Telehealth (INDEPENDENT_AMBULATORY_CARE_PROVIDER_SITE_OTHER): Payer: Self-pay | Admitting: Licensed Clinical Social Worker

## 2023-02-23 NOTE — Telephone Encounter (Signed)
LCSWA called patient today to introduce herself and to assess patients' mental health needs. Patient did not answer the phone. LCSWA was able to leave a brief message with the patient asking them to return the call. Patient was referred by PCP for Adjustment Disorder With Mixed Anxiety And Depressed Mood (Primary)

## 2023-02-24 NOTE — Telephone Encounter (Signed)
Will forward to [facility]

## 2023-02-24 NOTE — Telephone Encounter (Signed)
Pt returned call back to [facility]. Please call back

## 2023-03-04 ENCOUNTER — Telehealth (INDEPENDENT_AMBULATORY_CARE_PROVIDER_SITE_OTHER): Payer: Self-pay | Admitting: Licensed Clinical Social Worker

## 2023-03-04 NOTE — Telephone Encounter (Signed)
Called patient. To follow up with her and return call. Pt did not answer. I left a brief voicemail.

## 2023-03-05 ENCOUNTER — Encounter: Payer: Medicaid Other | Admitting: Obstetrics and Gynecology

## 2023-03-11 ENCOUNTER — Other Ambulatory Visit (INDEPENDENT_AMBULATORY_CARE_PROVIDER_SITE_OTHER): Payer: Self-pay | Admitting: Primary Care

## 2023-03-11 DIAGNOSIS — F4323 Adjustment disorder with mixed anxiety and depressed mood: Secondary | ICD-10-CM

## 2023-04-09 ENCOUNTER — Ambulatory Visit: Payer: Medicaid Other | Admitting: Physician Assistant

## 2023-04-16 ENCOUNTER — Ambulatory Visit: Payer: Medicaid Other | Admitting: Gastroenterology

## 2023-04-20 ENCOUNTER — Ambulatory Visit (INDEPENDENT_AMBULATORY_CARE_PROVIDER_SITE_OTHER): Payer: Medicaid Other | Admitting: Primary Care

## 2023-04-30 ENCOUNTER — Encounter: Payer: Medicaid Other | Admitting: Obstetrics and Gynecology

## 2023-05-06 ENCOUNTER — Ambulatory Visit (INDEPENDENT_AMBULATORY_CARE_PROVIDER_SITE_OTHER): Payer: Medicaid Other | Admitting: Primary Care

## 2023-05-28 ENCOUNTER — Ambulatory Visit (INDEPENDENT_AMBULATORY_CARE_PROVIDER_SITE_OTHER): Payer: Medicaid Other | Admitting: Primary Care

## 2023-05-28 NOTE — Progress Notes (Deleted)
Called patient informed missed appt she can call back and we can do a virtual or she can reschedule. Left VM

## 2023-06-18 ENCOUNTER — Encounter: Payer: Medicaid Other | Admitting: Obstetrics and Gynecology

## 2023-06-22 ENCOUNTER — Encounter (INDEPENDENT_AMBULATORY_CARE_PROVIDER_SITE_OTHER): Payer: Medicaid Other | Admitting: Primary Care

## 2023-06-28 NOTE — Progress Notes (Signed)
Tried to reach patient after her assigned visit.  Left voicemail to call back to start her appointment

## 2023-06-30 NOTE — Progress Notes (Deleted)
06/30/2023 Paula Giles [id number] [birth year]  Referring provider: Grayce Sessions, NP Primary GI doctor: {acdocs:27040}  ASSESSMENT AND PLAN:   Assessment and Plan              Patient Care Team: Grayce Sessions, NP as PCP - General (Internal Medicine)  HISTORY OF PRESENT ILLNESS: 37 y.o. female with a past medical history of ***and others listed below presents as a new patient for evaluation of elevated liver function after being seen in the ER.  Patient's had consistently elevated liver function for at least the past 4 years.  She has had normal thyroid negative lipase. 4 years ago AST 43, ALT 47 2 years ago AST 139, ALT 138, 10/03/2022 AST 18, ALT 101 10/28/2022 AST 100, ALT 93 02/17/2023 AST 90 ALT 73 2022 she had labs it looks like an evaluation of her elevated liver function with negative hepatitis C negative hepatitis B antigen, without immunity, negative HIV, normal ferritin.  She had negative mitochondrial antibody 01/14/2018 CT abdomen pelvis without contrast for hematuria showed mild diffuse low-attenuation liver without focal mass gallbladder and biliary tree within normal limits, normal pancreas normal spleen  Patient's had normal alk phos, T. bili, platelets 10/28/2022 Hgb 13.1, MCV 87.8 02/17/2023 Hgb 8.2, MCV normocytic at 84 Patient was started on iron but no iron studies or B12 were checked.  Discussed the use of AI scribe software for clinical note transcription with the patient, who gave verbal consent to proceed.  History of Present Illness             She {Actions; denies-reports:120008} blood thinner use.  She {Actions; denies-reports:120008} NSAID use.  She {Actions; denies-reports:120008} ETOH use.   She {Actions; denies-reports:120008} tobacco use.  She {Actions; denies-reports:120008} drug use.    She  reports that she has never smoked. She has never used smokeless tobacco. She reports current alcohol use of about 9.0 standard  drinks of alcohol per week. She reports that she does not use drugs.  RELEVANT LABS AND IMAGING:  Results          CBC    Component Value Date/Time   WBC 7.4 02/17/2023 1508   WBC 5.4 10/28/2022 1845   RBC 3.34 (L) 02/17/2023 1508   RBC 4.76 10/28/2022 1845   HGB 8.2 (L) 02/17/2023 1508   HCT 28.2 (L) 02/17/2023 1508   PLT 366 02/17/2023 1508   MCV 84 02/17/2023 1508   MCH 24.6 (L) 02/17/2023 1508   MCH 27.5 10/28/2022 1845   MCHC 29.1 (L) 02/17/2023 1508   MCHC 31.3 10/28/2022 1845   RDW 16.4 (H) 02/17/2023 1508   LYMPHSABS 2.0 02/17/2023 1508   MONOABS 0.7 10/28/2022 1845   EOSABS 0.2 02/17/2023 1508   BASOSABS 0.1 02/17/2023 1508   Recent Labs    10/03/22 1530 10/28/22 1845 02/17/23 1508  HGB 13.3 13.1 8.2*    CMP     Component Value Date/Time   NA 138 02/17/2023 1508   K 4.0 02/17/2023 1508   CL 99 02/17/2023 1508   CO2 25 02/17/2023 1508   GLUCOSE 82 02/17/2023 1508   GLUCOSE 76 10/28/2022 1656   BUN 10 02/17/2023 1508   CREATININE 0.77 02/17/2023 1508   CALCIUM 9.5 02/17/2023 1508   PROT 7.7 02/17/2023 1508   ALBUMIN 4.2 02/17/2023 1508   AST 90 (H) 02/17/2023 1508   ALT 73 (H) 02/17/2023 1508   ALKPHOS 67 02/17/2023 1508   BILITOT 0.3 02/17/2023 1508   GFRNONAA >  60 10/28/2022 1656   GFRAA 130 08/09/2020 1533      Latest Ref Rng & Units 02/17/2023    3:08 PM 10/28/2022    4:56 PM 10/03/2022    3:30 PM  Hepatic Function  Total Protein 6.0 - 8.5 g/dL 7.7  8.1  7.8   Albumin 3.9 - 4.9 g/dL 4.2  3.8  3.8   AST 0 - 40 IU/L 90  100  118   ALT 0 - 32 IU/L 73  93  101   Alk Phosphatase 44 - 121 IU/L 67  55  53   Total Bilirubin 0.0 - 1.2 mg/dL 0.3  0.6  0.7       Current Medications:    Current Outpatient Medications (Cardiovascular):    valsartan-hydrochlorothiazide (DIOVAN-HCT) 160-25 MG tablet, Take 1 tablet by mouth daily.  Current Outpatient Medications (Respiratory):    cetirizine (ZYRTEC ALLERGY) 10 MG tablet, Take 1 tablet (10 mg  total) by mouth daily. (Patient not taking: Reported on 02/17/2023)   fluticasone (FLONASE) 50 MCG/ACT nasal spray, Place 2 sprays into both nostrils daily.   Current Outpatient Medications (Hematological):    ferrous sulfate 325 (65 FE) MG tablet, Take 1 tablet (325 mg total) by mouth daily with breakfast.  Current Outpatient Medications (Other):    busPIRone (BUSPAR) 5 MG tablet, TAKE 1 TABLET BY MOUTH TWICE A DAY   dicyclomine (BENTYL) 20 MG tablet, Take 1 tablet (20 mg total) by mouth 2 (two) times daily. (Patient not taking: Reported on 02/17/2023)  Medical History:  Past Medical History:  Diagnosis Date   Anxiety    BV (bacterial vaginosis)    Irregular menstrual bleeding    Mood disorder (HCC)    Seasonal allergies    Allergies:  Allergies  Allergen Reactions   Latex Rash     Surgical History:  She  has a past surgical history that includes Cesarean section (2009) and Wisdom tooth extraction. Family History:  Her family history is not on file.  REVIEW OF SYSTEMS  : All other systems reviewed and negative except where noted in the History of Present Illness.  PHYSICAL EXAM: There were no vitals taken for this visit. General Appearance: Well nourished, in no apparent distress. Head:   Normocephalic and atraumatic. Eyes:  sclerae anicteric,conjunctive pink  Respiratory: Respiratory effort normal, BS equal bilaterally without rales, rhonchi, wheezing. Cardio: RRR with no MRGs. Peripheral pulses intact.  Abdomen: Soft,  {BlankSingle:19197::"Flat","Obese","Non-distended"} ,active bowel sounds. {actendernessAB:27319} tenderness {anatomy; site abdomen:5010}. {BlankMultiple:19196::"Without guarding","With guarding","Without rebound","With rebound"}. No masses. Rectal: {acrectalexam:27461} Musculoskeletal: Full ROM, {PSY - GAIT AND STATION:22860} gait. {With/Without:304960234} edema. Skin:  Dry and intact without significant lesions or rashes Neuro: Alert and  oriented x4;  No  focal deficits. Psych:  Cooperative. Normal mood and affect.    Doree Albee, PA-C 2:06 PM

## 2023-07-01 ENCOUNTER — Ambulatory Visit: Payer: Medicaid Other | Admitting: Physician Assistant

## 2023-08-20 ENCOUNTER — Other Ambulatory Visit (INDEPENDENT_AMBULATORY_CARE_PROVIDER_SITE_OTHER): Payer: Self-pay | Admitting: Primary Care

## 2023-08-20 DIAGNOSIS — I10 Essential (primary) hypertension: Secondary | ICD-10-CM

## 2023-08-20 MED ORDER — VALSARTAN-HYDROCHLOROTHIAZIDE 160-25 MG PO TABS: 1.0000 | ORAL_TABLET | Freq: Every day | ORAL | 1 refills | Status: DC

## 2023-08-20 NOTE — Telephone Encounter (Signed)
Will forward to provider  

## 2023-08-20 NOTE — Telephone Encounter (Signed)
Medication Refill -  Most Recent Primary Care Visit:  Provider: Grayce Sessions  Department: [facility]  Visit Type: MYCHART VIDEO VISIT  Date: 06/22/2023  Medication: valsartan-hydrochlorothiazide (DIOVAN-HCT) 160-25 MG tablet   Has the patient contacted their pharmacy? Yes Pharmacy sent the refill request yesterday. I did advise the pt that it could take up to 3 business days for the refill.  Is this the correct pharmacy for this prescription? Yes  This is the patient's preferred pharmacy:  [id number] - [place], [place] - [address] AT [facility] [address] [place] [address] Phone: [phone] Fax: [phone]   Has the prescription been filled recently? No  Is the patient out of the medication? Yes  Has the patient been seen for an appointment in the last year OR does the patient have an upcoming appointment? Yes  Can we respond through MyChart? No  Agent: Please be advised that Rx refills may take up to 3 business days. We ask that you follow-up with your pharmacy.

## 2023-08-26 NOTE — Progress Notes (Deleted)
   NEW GYNECOLOGY PATIENT Patient name: Paula Giles MRN [id number]  Date of birth: [birth year] Chief Complaint:   No chief complaint on file.     History:  Paula Giles is a 38 y.o. G1P1000 being seen today for ***.    Referred 05/2022 for AUB     Gynecologic History No LMP recorded. (Menstrual status: Irregular Periods). Contraception: {method:5051} Last Pap: ***. Result was {norm/abn:16337} with negative HPV Last Mammogram: {NA AND MVHQIONG:29528} Last Colonoscopy: {NA AND UXLKGMWN:02725}  Obstetric History OB History  Gravida Para Term Preterm AB Living  1 1 1      SAB IAB Ectopic Multiple Live Births          # Outcome Date GA Lbr Len/2nd Weight Sex Type Anes PTL Lv  1 Term      CS-Unspec       Past Medical History:  Diagnosis Date   Anxiety    BV (bacterial vaginosis)    Irregular menstrual bleeding    Mood disorder (HCC)    Seasonal allergies     Past Surgical History:  Procedure Laterality Date   CESAREAN SECTION  2009   WISDOM TOOTH EXTRACTION      Current Outpatient Medications on File Prior to Visit  Medication Sig Dispense Refill   busPIRone (BUSPAR) 5 MG tablet TAKE 1 TABLET BY MOUTH TWICE A DAY 180 tablet 1   cetirizine (ZYRTEC ALLERGY) 10 MG tablet Take 1 tablet (10 mg total) by mouth daily. (Patient not taking: Reported on 02/17/2023) 90 tablet 1   dicyclomine (BENTYL) 20 MG tablet Take 1 tablet (20 mg total) by mouth 2 (two) times daily. (Patient not taking: Reported on 02/17/2023) 20 tablet 0   ferrous sulfate 325 (65 FE) MG tablet Take 1 tablet (325 mg total) by mouth daily with breakfast. 90 tablet 1   fluticasone (FLONASE) 50 MCG/ACT nasal spray Place 2 sprays into both nostrils daily. 16 g 6   valsartan-hydrochlorothiazide (DIOVAN-HCT) 160-25 MG tablet Take 1 tablet by mouth daily. 30 tablet 1   No current facility-administered medications on file prior to visit.    Allergies  Allergen Reactions   Latex Rash    Social History:  reports  that she has never smoked. She has never used smokeless tobacco. She reports current alcohol use of about 9.0 standard drinks of alcohol per week. She reports that she does not use drugs.  No family history on file.  The following portions of the patient's history were reviewed and updated as appropriate: allergies, current medications, past family history, past medical history, past social history, past surgical history and problem list.  Review of Systems Pertinent items noted in HPI and remainder of comprehensive ROS otherwise negative.  Physical Exam:  There were no vitals taken for this visit. Physical Exam     Assessment and Plan:   There are no diagnoses linked to this encounter.   Routine preventative health maintenance measures emphasized. Please refer to After Visit Summary for other counseling recommendations.   Follow-up: No follow-ups on file.      Lorriane Shire, MD Obstetrician & Gynecologist, Faculty Practice [facility] [employer], [facility]

## 2023-08-27 ENCOUNTER — Encounter: Payer: Medicaid Other | Admitting: Obstetrics and Gynecology

## 2023-09-11 ENCOUNTER — Other Ambulatory Visit (INDEPENDENT_AMBULATORY_CARE_PROVIDER_SITE_OTHER): Payer: Self-pay | Admitting: Primary Care

## 2023-09-11 DIAGNOSIS — I10 Essential (primary) hypertension: Secondary | ICD-10-CM

## 2023-09-16 ENCOUNTER — Encounter: Payer: Medicaid Other | Admitting: Obstetrics and Gynecology

## 2023-09-23 ENCOUNTER — Ambulatory Visit: Payer: Medicaid Other | Admitting: Physician Assistant

## 2023-10-06 ENCOUNTER — Ambulatory Visit (INDEPENDENT_AMBULATORY_CARE_PROVIDER_SITE_OTHER): Payer: Self-pay | Admitting: Primary Care

## 2023-10-06 NOTE — Telephone Encounter (Signed)
 Chief Complaint: vaginal bleeding x 2 weeks, weakness Symptoms: vaginal bleeding changing pads 1 every 2 hours and now has slowed flow. Blood clots reported long stringy. Reports night sweats, get hot then cold. Weakness reported and gets dizzy at times.  Frequency: 2 weeks  Pertinent Negatives: Patient denies dizziness now no fever no severe bleeding  Disposition: [] ED /[] Urgent Care (no appt availability in office) / [x] Appointment(In office/virtual)/ []  [facility]/ [] Home Care/ [] Refused Recommended Disposition /[] [facility]/ []  Follow-up with PCP Additional Notes:   Scheduled appt for 10/13/23 . Patient did not want to schedule earlier appt with another provider. Please advise if earlier appt available or if patient needs to bee see earlier. Patient was not able to make appt with OBGYN . Please advise      Copied from CRM [id number]. Topic: Clinical - Red Word Triage >> Oct 06, 2023  2:28 PM Desma Mcgregor wrote: Red Word that prompted transfer to Nurse Triage: For the last two weeks, pt has been constantly bleeding and very weak. She says she has anemia and fibroids already and wants to get some blood work done to see what's going on. Looking to be scheduled. Reason for Disposition  Periods last > 7 days  Answer Assessment - Initial Assessment Questions 1. AMOUNT: "Describe the bleeding that you are having."    - SPOTTING: spotting, or pinkish / brownish mucous discharge; does not fill panty liner or pad    - MILD:  less than 1 pad / hour; less than patient's usual menstrual bleeding   - MODERATE: 1-2 pads / hour; 1 menstrual cup every 6 hours; small-medium blood clots (e.g., pea, grape, small coin)   - SEVERE: soaking 2 or more pads/hour for 2 or more hours; 1 menstrual cup every 2 hours; bleeding not contained by pads or continuous red blood from vagina; large blood clots (e.g., golf ball, large coin)      Moderate bleeding now changing pads approx 1 every 2 hours  2.  ONSET: "When did the bleeding begin?" "Is it continuing now?"     2 weeks ago  3. MENSTRUAL PERIOD: "When was the last normal menstrual period?" "How is this different than your period?"     na 4. REGULARITY: "How regular are your periods?"     na 5. ABDOMEN PAIN: "Do you have any pain?" "How bad is the pain?"  (e.g., Scale 1-10; mild, moderate, or severe)   - MILD (1-3): doesn't interfere with normal activities, abdomen soft and not tender to touch    - MODERATE (4-7): interferes with normal activities or awakens from sleep, abdomen tender to touch    - SEVERE (8-10): excruciating pain, doubled over, unable to do any normal activities      Denies  6. PREGNANCY: "Is there any chance you are pregnant?" "When was your last menstrual period?"     na 7. BREASTFEEDING: "Are you breastfeeding?"     na 8. HORMONE MEDICINES: "Are you taking any hormone medicines, prescription or over-the-counter?" (e.g., birth control pills, estrogen)     na 9. BLOOD THINNER MEDICINES: "Do you take any blood thinners?" (e.g., Coumadin / warfarin, Pradaxa / dabigatran, aspirin)     na 10. CAUSE: "What do you think is causing the bleeding?" (e.g., recent gyn surgery, recent gyn procedure; known bleeding disorder, cervical cancer, polycystic ovarian disease, fibroids)         Hx anemia  11. HEMODYNAMIC STATUS: "Are you weak or feeling lightheaded?" If Yes,  ask: "Can you stand and walk normally?"        Weakness , dizziness comes and goes  12. OTHER SYMPTOMS: "What other symptoms are you having with the bleeding?" (e.g., passed tissue, vaginal discharge, fever, menstrual-type cramps)       Blood clots long stringy  Protocols used: Vaginal Bleeding - Abnormal-A-AH

## 2023-10-07 ENCOUNTER — Encounter (INDEPENDENT_AMBULATORY_CARE_PROVIDER_SITE_OTHER): Payer: Self-pay | Admitting: Primary Care

## 2023-10-07 ENCOUNTER — Other Ambulatory Visit (INDEPENDENT_AMBULATORY_CARE_PROVIDER_SITE_OTHER): Payer: Self-pay | Admitting: Primary Care

## 2023-10-07 DIAGNOSIS — N921 Excessive and frequent menstruation with irregular cycle: Secondary | ICD-10-CM

## 2023-10-12 ENCOUNTER — Telehealth (INDEPENDENT_AMBULATORY_CARE_PROVIDER_SITE_OTHER): Payer: Self-pay

## 2023-10-12 ENCOUNTER — Telehealth (INDEPENDENT_AMBULATORY_CARE_PROVIDER_SITE_OTHER): Payer: Self-pay | Admitting: Primary Care

## 2023-10-12 NOTE — Telephone Encounter (Signed)
 Spoke to pt about appt.. Will be present

## 2023-10-12 NOTE — Telephone Encounter (Unsigned)
 Copied from CRM [id number]. Topic: Appointments - Scheduling Inquiry for Clinic >> Oct 12, 2023  3:24 PM Albin Felling L wrote: Reason for CRM: Patient cancelled tomorrow's appointment due to work. Patient requesting to be squeezed in for an appointment this Friday 03/14 after 2:30pm.   Patient requesting a callback

## 2023-10-13 ENCOUNTER — Ambulatory Visit (INDEPENDENT_AMBULATORY_CARE_PROVIDER_SITE_OTHER): Admitting: Primary Care

## 2023-10-17 ENCOUNTER — Other Ambulatory Visit (INDEPENDENT_AMBULATORY_CARE_PROVIDER_SITE_OTHER): Payer: Self-pay | Admitting: Primary Care

## 2023-10-17 DIAGNOSIS — I10 Essential (primary) hypertension: Secondary | ICD-10-CM

## 2023-10-26 ENCOUNTER — Telehealth (INDEPENDENT_AMBULATORY_CARE_PROVIDER_SITE_OTHER): Payer: Self-pay | Admitting: Primary Care

## 2023-10-26 NOTE — Telephone Encounter (Signed)
 Left VM with pt about their upcoming appt.

## 2023-11-03 ENCOUNTER — Ambulatory Visit (INDEPENDENT_AMBULATORY_CARE_PROVIDER_SITE_OTHER): Admitting: Primary Care

## 2023-11-03 ENCOUNTER — Encounter (INDEPENDENT_AMBULATORY_CARE_PROVIDER_SITE_OTHER): Payer: Self-pay | Admitting: Primary Care

## 2023-11-03 VITALS — BP 128/84 | HR 89 | Temp 97.8°F | Resp 16 | Ht 64.0 in | Wt 270.0 lb

## 2023-11-03 DIAGNOSIS — G4701 Insomnia due to medical condition: Secondary | ICD-10-CM

## 2023-11-03 DIAGNOSIS — Z139 Encounter for screening, unspecified: Secondary | ICD-10-CM

## 2023-11-03 DIAGNOSIS — F4323 Adjustment disorder with mixed anxiety and depressed mood: Secondary | ICD-10-CM

## 2023-11-03 DIAGNOSIS — D649 Anemia, unspecified: Secondary | ICD-10-CM | POA: Diagnosis not present

## 2023-11-03 DIAGNOSIS — F411 Generalized anxiety disorder: Secondary | ICD-10-CM | POA: Diagnosis not present

## 2023-11-03 DIAGNOSIS — D219 Benign neoplasm of connective and other soft tissue, unspecified: Secondary | ICD-10-CM

## 2023-11-03 DIAGNOSIS — I426 Alcoholic cardiomyopathy: Secondary | ICD-10-CM

## 2023-11-03 DIAGNOSIS — I10 Essential (primary) hypertension: Secondary | ICD-10-CM

## 2023-11-03 DIAGNOSIS — R5383 Other fatigue: Secondary | ICD-10-CM

## 2023-11-03 MED ORDER — VALSARTAN-HYDROCHLOROTHIAZIDE 160-25 MG PO TABS: 1.0000 | ORAL_TABLET | Freq: Every day | ORAL | 1 refills | Status: DC

## 2023-11-03 MED ORDER — TRAZODONE HCL 50 MG PO TABS
25.0000 mg | ORAL_TABLET | Freq: Every evening | ORAL | 3 refills | Status: DC | PRN
Start: 2023-11-03 — End: 2023-11-05

## 2023-11-03 NOTE — Progress Notes (Signed)
 [facility]  Paula Giles, is a 38 y.o. female  ZOX:[id number]  [id number]  DOB - 1986/02/15  anemia      Subjective:   Paula Giles is a 38 y.o. female here today for a follow up visit.  Blood pressure is well-controlled .  Patient denies No headache, No chest pain, No abdominal pain - No Nausea, No new weakness tingling or numbness, No Cough - shortness of breath.  She is unhappy with her weight and how she feels about herself will not look in the mirror.  She has anxiety depression and insomnia.  Patient stated she did not like BuSpar although she has been refilling and trying to tolerate.  Discontinued at this visit.  She also admits to cravings for ice.  Explained that it is pica and a part of anemia labs will be taken today.  She also admitted to being worried about her liver drinks 1-1/2 bottles of wine a day.  Also, wants to improve her health her best friend has last week.  No problems updated.  Comprehensive ROS Pertinent positive and negative noted in HPI   Allergies  Allergen Reactions   Latex Rash   Past Medical History:  Diagnosis Date   Anxiety    BV (bacterial vaginosis)    Irregular menstrual bleeding    Mood disorder (HCC)    Seasonal allergies     Current Outpatient Medications on File Prior to Visit  Medication Sig Dispense Refill   fluticasone (FLONASE) 50 MCG/ACT nasal spray Place 2 sprays into both nostrils daily. 16 g 6   ferrous sulfate 325 (65 FE) MG tablet Take 1 tablet (325 mg total) by mouth daily with breakfast. 90 tablet 1   No current facility-administered medications on file prior to visit.   Health Maintenance  Topic Date Due   DTaP/Tdap/Td vaccine (1 - Tdap) Never done   COVID-19 Vaccine (1 - 2024-25 season) Never done   Flu Shot  03/04/2024   Pap with HPV screening  11/19/2025   Hepatitis C Screening  Completed   HIV Screening  Completed   HPV Vaccine  Aged Out    Objective:   Vitals:   11/03/23 1037  BP:  128/84  Pulse: 89  Resp: 16  Temp: 97.8 F (36.6 C)  TempSrc: Oral  SpO2: 99%  Weight: 270 lb (122.5 kg)  Height: 5\' 4"  (1.626 m)   BP Readings from Last 3 Encounters:  11/03/23 128/84  02/17/23 126/84  10/28/22 136/66      Physical Exam Vitals reviewed.  Constitutional:      Appearance: Normal appearance. She is obese.     Comments: Severe morbid obesity   HENT:     Head: Normocephalic.     Right Ear: Tympanic membrane, ear canal and external ear normal.     Left Ear: Tympanic membrane, ear canal and external ear normal.     Nose: Nose normal.     Mouth/Throat:     Mouth: Mucous membranes are moist.  Eyes:     Extraocular Movements: Extraocular movements intact.     Pupils: Pupils are equal, round, and reactive to light.  Cardiovascular:     Rate and Rhythm: Normal rate.  Pulmonary:     Effort: Pulmonary effort is normal.     Breath sounds: Normal breath sounds.  Abdominal:     General: Bowel sounds are normal.     Palpations: Abdomen is soft.  Musculoskeletal:        General: Normal  range of motion.     Cervical back: Normal range of motion.  Skin:    General: Skin is warm and dry.  Neurological:     Mental Status: She is alert and oriented to person, place, and time.  Psychiatric:        Mood and Affect: Mood normal.        Behavior: Behavior normal.        Thought Content: Thought content normal.       Assessment & Plan  Diagnoses and all orders for this visit:  Anemia, unspecified type -     CBC with Differential -     CMP14+EGFR  Morbidly obese (HCC) -     Lipid Panel  GAD (generalized anxiety disorder) -     traZODone (DESYREL) 50 MG tablet; Take 0.5-1 tablets (25-50 mg total) by mouth at bedtime as needed for sleep. -     Cancel: Ambulatory referral to Psychiatry -     Ambulatory referral to Psychiatry  Adjustment disorder with mixed anxiety and depressed mood -     CBC with Differential -     Cancel: Ambulatory referral to  Psychiatry -     Ambulatory referral to Psychiatry  Insomnia due to medical condition -     traZODone (DESYREL) 50 MG tablet; Take 0.5-1 tablets (25-50 mg total) by mouth at bedtime as needed for sleep. -     Cancel: Ambulatory referral to Psychiatry -     Ambulatory referral to Psychiatry  GAD (generalized anxiety disorder) -     traZODone HCl; Take 0.5-1 tablets (25-50 mg total) by mouth at bedtime as needed for sleep.  Dispense: 30 tablet; Refill: 3  Adjustment disorder with mixed anxiety and depressed mood -     CBC with Differential/Platelet  Insomnia due to medical condition -     traZODone HCl; Take 0.5-1 tablets (25-50 mg total) by mouth at bedtime as needed for sleep.  Dispense: 30 tablet; Refill: 3  Fatigue, unspecified type -     TSH + free T4  Primary hypertension BP goal - is at goal  Explained that having normal blood pressure is the goal and medications are helping to get to goal and maintain normal blood pressure. DIET: Limit salt intake, read nutrition labels to check salt content, limit fried and high fatty foods  Avoid using multisymptom OTC cold preparations that generally contain sudafed which can rise BP. Consult with pharmacist on best cold relief products to use for persons with HTN EXERCISE Discussed incorporating exercise such as walking - 30 minutes most days of the week and can do in 10 minute intervals    -     Valsartan-hydroCHLOROthiazide; Take 1 tablet by mouth daily.  Dispense: 90 tablet; Refill: 1  Alcoholic cardiomyopathy (HCC) Refer for tx  Fibroids -     Ambulatory referral to Gynecology  Patient have been counseled extensively about nutrition and exercise. Other issues discussed during this visit include: low cholesterol diet, weight control and daily exercise, foot care, annual eye examinations at Ophthalmology, importance of adherence with medications and regular follow-up. We also discussed long term complications of uncontrolled diabetes  and hypertension.   Return in about 2 months (around 01/03/2024) for months.  The patient was given clear instructions to go to ER or return to medical center if symptoms don't improve, worsen or new problems develop. The patient verbalized understanding. The patient was told to call to get lab results if they haven't heard anything in  the next week.   This note has been created with Education officer, environmental. Any transcriptional errors are unintentional.   Grayce Sessions, NP 11/03/2023, 1:50 PM

## 2023-11-03 NOTE — Progress Notes (Signed)
 Hormones- depression and anxiety Anemia Labs  Need refill on medications

## 2023-11-03 NOTE — Addendum Note (Signed)
 Addended by: Dalene Carrow I on: 11/03/2023 02:16 PM   Modules accepted: Orders

## 2023-11-04 ENCOUNTER — Telehealth: Payer: Self-pay | Admitting: *Deleted

## 2023-11-04 ENCOUNTER — Encounter (INDEPENDENT_AMBULATORY_CARE_PROVIDER_SITE_OTHER): Payer: Self-pay | Admitting: Primary Care

## 2023-11-04 LAB — LIPID PANEL

## 2023-11-04 NOTE — Progress Notes (Signed)
 Complex Care Management Note Care Guide Note  11/04/2023 Name: Paula Giles MRN: [id number] DOB: 05/31/1986  Paula Giles is a 38 y.o. year old female who is a primary care patient of Grayce Sessions, NP . The community resource team was consulted for assistance with Transportation Needs , Food Insecurity, and Financial Difficulties related to Utilities   SDOH screenings and interventions completed:  Yes     SDOH Interventions Today    Flowsheet Row Most Recent Value  SDOH Interventions   Food Insecurity Interventions Community Resources Provided  [Provided with App for food banks]  Transportation Interventions Intervention Not Indicated  [Does not need transportation now has medicaid]  Utilities Interventions [employer] Provided  [Gas very high owe will call dss given resources for possible utilities assistance]      Recommended follow with DSS and also Holiday representative and also provided food banks   Care guide performed the following interventions: Patient provided with information about care guide support team and interviewed to confirm resource needs.  Follow Up Plan:  No further follow up planned at this time. The patient has been provided with needed resources.  Encounter Outcome:  Patient Visit Completed  Dione Booze  [facility] HealthPopulation Health Care Guide  Direct Dial:[phone] Fax:[phone] Website: [email or web address]

## 2023-11-04 NOTE — Progress Notes (Signed)
 Complex Care Management Note  Care Guide Note 11/04/2023 Name: Paula Giles MRN: [id number] DOB: [birth year]  Paula Giles is a 38 y.o. year old female who sees Grayce Sessions, NP for primary care. I reached out to Paula Giles by phone today to offer complex care management services.  [name] was given information about Complex Care Management services today including:   The Complex Care Management services include support from the care team which includes your Nurse Care Manager, Clinical Social Worker, or Pharmacist.  The Complex Care Management team is here to help remove barriers to the health concerns and goals most important to you. Complex Care Management services are voluntary, and the patient may decline or stop services at any time by request to their care team member.   Complex Care Management Consent Status: Patient agreed to services and verbal consent obtained.   Follow up plan:  Telephone appointment with complex care management team member scheduled for:  4/3  Encounter Outcome:  Patient Scheduled  Gwenevere Ghazi  [facility]  [facility] Guide  Direct Dial: [phone]  Fax [phone]

## 2023-11-05 ENCOUNTER — Encounter: Payer: Self-pay | Admitting: Gastroenterology

## 2023-11-05 ENCOUNTER — Ambulatory Visit: Payer: Medicaid Other | Admitting: Gastroenterology

## 2023-11-05 ENCOUNTER — Other Ambulatory Visit (INDEPENDENT_AMBULATORY_CARE_PROVIDER_SITE_OTHER)

## 2023-11-05 ENCOUNTER — Telehealth: Payer: Self-pay

## 2023-11-05 ENCOUNTER — Other Ambulatory Visit: Payer: Self-pay

## 2023-11-05 VITALS — BP 106/60 | HR 83 | Ht 64.0 in | Wt 269.0 lb

## 2023-11-05 DIAGNOSIS — R7989 Other specified abnormal findings of blood chemistry: Secondary | ICD-10-CM

## 2023-11-05 DIAGNOSIS — Z6841 Body Mass Index (BMI) 40.0 and over, adult: Secondary | ICD-10-CM

## 2023-11-05 DIAGNOSIS — D649 Anemia, unspecified: Secondary | ICD-10-CM

## 2023-11-05 DIAGNOSIS — K219 Gastro-esophageal reflux disease without esophagitis: Secondary | ICD-10-CM

## 2023-11-05 DIAGNOSIS — R1013 Epigastric pain: Secondary | ICD-10-CM

## 2023-11-05 DIAGNOSIS — R140 Abdominal distension (gaseous): Secondary | ICD-10-CM

## 2023-11-05 DIAGNOSIS — K5900 Constipation, unspecified: Secondary | ICD-10-CM

## 2023-11-05 LAB — CBC WITH DIFFERENTIAL/PLATELET
Basophils Absolute: 0.1 10*3/uL (ref 0.0–0.2)
Basophils Absolute: 0.1 10*3/uL (ref 0.0–0.1)
Basophils Relative: 0.7 % (ref 0.0–3.0)
Basos: 1 %
EOS (ABSOLUTE): 0.2 10*3/uL (ref 0.0–0.4)
Eos: 3 %
Eosinophils Absolute: 0.2 10*3/uL (ref 0.0–0.7)
Eosinophils Relative: 2.5 % (ref 0.0–5.0)
HCT: 36.2 % (ref 36.0–46.0)
Hematocrit: 38.7 % (ref 34.0–46.6)
Hemoglobin: 11.8 g/dL (ref 11.1–15.9)
Hemoglobin: 11.8 g/dL — ABNORMAL LOW (ref 12.0–15.0)
Immature Grans (Abs): 0 10*3/uL (ref 0.0–0.1)
Immature Granulocytes: 0 %
Lymphocytes Absolute: 1.6 10*3/uL (ref 0.7–3.1)
Lymphocytes Relative: 25.7 % (ref 12.0–46.0)
Lymphs Abs: 1.8 10*3/uL (ref 0.7–4.0)
Lymphs: 25 %
MCH: 27.6 pg (ref 26.6–33.0)
MCHC: 30.5 g/dL — ABNORMAL LOW (ref 31.5–35.7)
MCHC: 32.6 g/dL (ref 30.0–36.0)
MCV: 90 fL (ref 79–97)
MCV: 87.6 fl (ref 78.0–100.0)
Monocytes Absolute: 0.8 10*3/uL (ref 0.1–0.9)
Monocytes Absolute: 0.7 10*3/uL (ref 0.1–1.0)
Monocytes Relative: 10 % (ref 3.0–12.0)
Monocytes: 12 %
Neutro Abs: 4.2 10*3/uL (ref 1.4–7.7)
Neutrophils Absolute: 3.7 10*3/uL (ref 1.4–7.0)
Neutrophils Relative %: 61.1 % (ref 43.0–77.0)
Neutrophils: 59 %
Platelets: 364 10*3/uL (ref 150–450)
Platelets: 309 10*3/uL (ref 150.0–400.0)
RBC: 4.28 x10E6/uL (ref 3.77–5.28)
RBC: 4.13 Mil/uL (ref 3.87–5.11)
RDW: 15.4 % (ref 11.7–15.4)
RDW: 17 % — ABNORMAL HIGH (ref 11.5–15.5)
WBC: 6.3 10*3/uL (ref 3.4–10.8)
WBC: 6.9 10*3/uL (ref 4.0–10.5)

## 2023-11-05 LAB — COMPREHENSIVE METABOLIC PANEL WITH GFR
ALT: 67 U/L — ABNORMAL HIGH (ref 0–35)
AST: 77 U/L — ABNORMAL HIGH (ref 0–37)
Albumin: 4.4 g/dL (ref 3.5–5.2)
Alkaline Phosphatase: 67 U/L (ref 39–117)
BUN: 11 mg/dL (ref 6–23)
CO2: 28 meq/L (ref 19–32)
Calcium: 9.5 mg/dL (ref 8.4–10.5)
Chloride: 98 meq/L (ref 96–112)
Creatinine, Ser: 0.79 mg/dL (ref 0.40–1.20)
GFR: 94.94 mL/min (ref >60.00)
Glucose, Bld: 94 mg/dL (ref 70–99)
Potassium: 3.6 meq/L (ref 3.5–5.1)
Sodium: 136 meq/L (ref 135–145)
Total Bilirubin: 0.4 mg/dL (ref 0.2–1.2)
Total Protein: 8.6 g/dL — ABNORMAL HIGH (ref 6.0–8.3)

## 2023-11-05 LAB — CMP14+EGFR
ALT: 65 IU/L — ABNORMAL HIGH (ref 0–32)
AST: 80 IU/L — ABNORMAL HIGH (ref 0–40)
Albumin: 4.6 g/dL (ref 3.9–4.9)
Alkaline Phosphatase: 82 IU/L (ref 44–121)
BUN/Creatinine Ratio: 13 (ref 9–23)
BUN: 10 mg/dL (ref 6–20)
Bilirubin Total: 0.3 mg/dL (ref 0.0–1.2)
CO2: 21 mmol/L (ref 20–29)
Calcium: 9.4 mg/dL (ref 8.7–10.2)
Chloride: 99 mmol/L (ref 96–106)
Creatinine, Ser: 0.77 mg/dL (ref 0.57–1.00)
Globulin, Total: 3.7 g/dL (ref 1.5–4.5)
Glucose: 88 mg/dL (ref 70–99)
Potassium: 3.8 mmol/L (ref 3.5–5.2)
Sodium: 138 mmol/L (ref 134–144)
Total Protein: 8.3 g/dL (ref 6.0–8.5)
eGFR: 101 mL/min/{1.73_m2} (ref >59)

## 2023-11-05 LAB — IBC + FERRITIN
Ferritin: 21.2 ng/mL (ref 10.0–291.0)
Iron: 17 ug/dL — ABNORMAL LOW (ref 42–145)
Saturation Ratios: 3.4 % — ABNORMAL LOW (ref 20.0–50.0)
TIBC: 502.6 ug/dL — ABNORMAL HIGH (ref 250.0–450.0)
Transferrin: 359 mg/dL (ref 212.0–360.0)

## 2023-11-05 LAB — LIPID PANEL
Cholesterol, Total: 148 mg/dL (ref 100–199)
HDL: 42 mg/dL (ref >39)
LDL CALC COMMENT:: 3.5 ratio (ref 0.0–4.4)
LDL Chol Calc (NIH): 90 mg/dL (ref 0–99)
Triglycerides: 82 mg/dL (ref 0–149)
VLDL Cholesterol Cal: 16 mg/dL (ref 5–40)

## 2023-11-05 LAB — PROTIME-INR
INR: 1.1 ratio — ABNORMAL HIGH (ref 0.8–1.0)
Prothrombin Time: 12.1 s (ref 9.6–13.1)

## 2023-11-05 LAB — TSH+FREE T4
Free T4: 1.44 ng/dL (ref 0.82–1.77)
TSH: 2.91 u[IU]/mL (ref 0.450–4.500)

## 2023-11-05 MED ORDER — PANTOPRAZOLE SODIUM 40 MG PO TBEC: 40.0000 mg | DELAYED_RELEASE_TABLET | Freq: Every day | ORAL | 2 refills | Status: DC

## 2023-11-05 NOTE — Telephone Encounter (Signed)
 Copied from CRM [id number]. Topic: Clinical - Lab/Test Results >> Nov 05, 2023  4:17 PM Elle L wrote: Reason for CRM: The patient is requesting a call back at [phone] regarding her lab results and a prescription for [facility].

## 2023-11-05 NOTE — Progress Notes (Signed)
 Chief Complaint: Anemia and elevated LFTs Primary GI MD: Gentry Fitz  HPI: 38 year old female history of anxiety, irregular menstrual bleeding, and other medical history as listed below presents for evaluation of anemia.  Seen by PCP 11/03/2023 Appears patient has had intermittent anemia over the last year, on lab review appears this states all the way back to 2016 with initial Hgb of 11.0 at that time 05/2022 Hgb 8.3, MCV 87 10/2022 Hgb 13.3, MCV 84 02/2023 Hgb 8.2, MCV 84 11/2023 Hgb 11.8, MCV 90  With continued anemia and irregular menstrual bleeding patient was referred to Gyn 10/2023 and GI for both anemia and elevated LFTs  Most recent labs 11/03/2023 normal BUN.  Patient is also noted to have chronically elevated LFTs over the last 3 years in a hepatocellular pattern with elevated AST/ALT as high as AST 139/ALT 138 but most recent being AST 80/ALT 65.  Patient had liver workup 11/2020 which showed immunity to hepatitis B, no active hepatitis, normal ferritin/iron studies from a weak ASMA of 21, negative AMA, HCV negative  CT renal stone study 01/2018 with mild diffuse low-attenuation of liver without focal mass.  Discussed the use of AI scribe software for clinical note transcription with the patient, who gave verbal consent to proceed.  History of Present Illness  She experiences gastrointestinal symptoms including heartburn, indigestion, nausea, and abdominal pain, particularly in the upper abdomen. There is a sensation of fullness and bloating, with a feeling of early satiety when eating. These symptoms occur both with and without food intake, and drinking water sometimes alleviates the discomfort. No significant weight loss, rectal bleeding, or changes in bowel habits are noted, although she feels incomplete bowel movements despite daily soft and formed stools. Current medication use includes occasional Pepto Bismol for gastrointestinal symptoms and ibuprofen for headaches or leg pain,  taken a couple of times a month. She does not use regular antacids.  She has a history of elevated liver enzymes, with no prior investigation. Daily alcohol consumption, specifically wine, is noted, with an intake of approximately 1 bottle per day. There is no family history of liver disease.  She has been experiencing irregular menstrual bleeding for the past ten years, with episodes lasting from three days to two weeks, which may contribute to her anemia. She has been referred to a gynecologist for further evaluation of her fibroids and irregular bleeding.   Past Medical History:  Diagnosis Date   Anemia    Anxiety    Arthritis    BV (bacterial vaginosis)    Depression    Hypertension    Irregular menstrual bleeding    Mood disorder (HCC)    Obesity    Seasonal allergies     Past Surgical History:  Procedure Laterality Date   CESAREAN SECTION  2009   WISDOM TOOTH EXTRACTION      Current Outpatient Medications  Medication Sig Dispense Refill   busPIRone (BUSPAR) 5 MG tablet Take 5 mg by mouth daily as needed.     cetirizine (ZYRTEC) 10 MG tablet Take 10 mg by mouth daily.     OVER THE COUNTER MEDICATION Iron one daily     pantoprazole (PROTONIX) 40 MG tablet Take 1 tablet (40 mg total) by mouth daily. 30 tablet 2   valsartan-hydrochlorothiazide (DIOVAN-HCT) 160-25 MG tablet Take 1 tablet by mouth daily. 90 tablet 1   No current facility-administered medications for this visit.    Allergies as of 11/05/2023 - Review Complete 11/05/2023  Allergen Reaction Noted  Latex Rash 06/19/2017    Family History  Problem Relation Age of Onset   Hypertension Mother    Congestive Heart Failure Mother    Hypertension Father    Irritable bowel syndrome Maternal Grandmother    Colon cancer Maternal Grandfather    Esophageal cancer Neg Hx     Social History   Socioeconomic History   Marital status: Single    Spouse name: Not on file   Number of children: 1   Years of  education: Not on file   Highest education level: Not on file  Occupational History   Occupation: [employer]  Tobacco Use   Smoking status: Never   Smokeless tobacco: Never  Vaping Use   Vaping status: Never Used  Substance and Sexual Activity   Alcohol use: Yes    Alcohol/week: 9.0 standard drinks of alcohol    Types: 5 Glasses of wine, 4 Shots of liquor per week    Comment: daily wine   Drug use: No   Sexual activity: Not Currently  Other Topics Concern   Not on file  Social History Narrative   Not on file   Social Drivers of Health   Financial Resource Strain: Not on file  Food Insecurity: Food Insecurity Present (11/03/2023)   Hunger Vital Sign    Worried About Running Out of Food in the Last Year: Often true    Ran Out of Food in the Last Year: Often true  Transportation Needs: Unmet Transportation Needs (11/03/2023)   PRAPARE - Administrator, Civil Service (Medical): Yes    Lack of Transportation (Non-Medical): Yes  Physical Activity: Not on file  Stress: Not on file  Social Connections: Unknown (06/11/2023)   Received from [facility]   Social Network    Social Network: Not on file  Intimate Partner Violence: Unknown (06/11/2023)   Received from [facility]   HITS    Physically Hurt: Not on file    Insult or Talk Down To: Not on file    Threaten Physical Harm: Not on file    Scream or Curse: Not on file    Review of Systems:    Constitutional: No weight loss, fever, chills, weakness or fatigue HEENT: Eyes: No change in vision               Ears, Nose, Throat:  No change in hearing or congestion Skin: No rash or itching Cardiovascular: No chest pain, chest pressure or palpitations   Respiratory: No SOB or cough Gastrointestinal: See HPI and otherwise negative Genitourinary: No dysuria or change in urinary frequency Neurological: No headache, dizziness or syncope Musculoskeletal: No new muscle or joint pain Hematologic: No bleeding or  bruising Psychiatric: No history of depression or anxiety    Physical Exam:  Vital signs: BP 106/60   Pulse 83   Ht 5\' 4"  (1.626 m)   Wt 269 lb (122 kg)   LMP 10/17/2023 (Approximate)   BMI 46.17 kg/m   Constitutional: NAD, obese pleasant female head:  Normocephalic and atraumatic. Eyes:   PEERL, EOMI. No icterus. Conjunctiva pink. Respiratory: Respirations even and unlabored. Lungs clear to auscultation bilaterally.   No wheezes, crackles, or rhonchi.  Cardiovascular:  Regular rate and rhythm. No peripheral edema, cyanosis or pallor.  Gastrointestinal:  Soft, nondistended, nontender. No rebound or guarding. Normal bowel sounds. No appreciable masses or hepatomegaly. Rectal:  Not performed.  Msk:  Symmetrical without gross deformities. Without edema, no deformity or joint abnormality.  Neurologic:  Alert and  oriented x4;  grossly normal neurologically.  Skin:   Dry and intact without significant lesions or rashes. Psychiatric: Oriented to person, place and time. Demonstrates good judgement and reason without abnormal affect or behaviors.   RELEVANT LABS AND IMAGING: CBC    Component Value Date/Time   WBC 6.3 11/03/2023 1116   WBC 5.4 10/28/2022 1845   RBC 4.28 11/03/2023 1116   RBC 4.76 10/28/2022 1845   HGB 11.8 11/03/2023 1116   HCT 38.7 11/03/2023 1116   PLT 364 11/03/2023 1116   MCV 90 11/03/2023 1116   MCH 27.6 11/03/2023 1116   MCH 27.5 10/28/2022 1845   MCHC 30.5 (L) 11/03/2023 1116   MCHC 31.3 10/28/2022 1845   RDW 15.4 11/03/2023 1116   LYMPHSABS 1.6 11/03/2023 1116   MONOABS 0.7 10/28/2022 1845   EOSABS 0.2 11/03/2023 1116   BASOSABS 0.1 11/03/2023 1116    CMP     Component Value Date/Time   NA 138 11/03/2023 1116   K 3.8 11/03/2023 1116   CL 99 11/03/2023 1116   CO2 21 11/03/2023 1116   GLUCOSE 88 11/03/2023 1116   GLUCOSE 76 10/28/2022 1656   BUN 10 11/03/2023 1116   CREATININE 0.77 11/03/2023 1116   CALCIUM 9.4 11/03/2023 1116   PROT 8.3  11/03/2023 1116   ALBUMIN 4.6 11/03/2023 1116   AST 80 (H) 11/03/2023 1116   ALT 65 (H) 11/03/2023 1116   ALKPHOS 82 11/03/2023 1116   BILITOT 0.3 11/03/2023 1116   GFRNONAA >60 10/28/2022 1656   GFRAA 130 08/09/2020 1533     Assessment/Plan:   Chronic anemia Chronic intermittent anemia since at least 2016 with most recent labs showing Hgb 11.8.  Iron studies in 2022 were normal.  She does have a history of irregular menstrual bleeding  in which she will have a period for over 2 weeks at  time described as heavy.  This coupled with previous history of fibroids is likely the result of her anemia.  She was referred to GYN but no previous EGD/colonoscopy. - CBC, CMP, iron studies and vitamin B12/folate today - Continue workup with GYN - If continued IDA and negative workup for GYN will consider EGD/colonoscopy  Elevated LFTs Chronically elevated LFTs and hepatocellular pattern with elevation of AST/ALT over the last 3 years.  Workup in 2022 with negative hepatitis panel, negative AMA, weak positive ASMA of 21 (likely due to chronic alcohol use).  No past dedicated imaging of the liver.  She does have a BMI of 46.  1 bottle of wine per night. Suspected metabolic dysfunction associated steatohepatitis verus alcoholic liver disease (MASH, formerly NASH)  by history. Must exclude other chronic causes of hepatocellular inflammation. - Labs to include: hepatitis panel, iron, ferritin, TIBC,  IgG, ANA, Antismooth muscle antibody, AMA, celiac, thyroid, alpha-one-antitrypsin level - will get elastography, consider fibrosure -- stop alcohol use - if stage 2-3 fibrosis can consider Rezdiffra - need LFTs and CBC monitored every 6 months, revaluation every 2-3 years.  -Continue to work on risk factor modification including diet exercise and control of risk factors including blood sugars.  Epigastric pain Bloating GERD Reported indigestion, epigastric pain, bloating with eating not on any antacid.   Nightly alcohol likely contributing.  Ibuprofen once per week.  Discussed possibility of medical management versus endoscopic evaluation.  Patient is hesitant towards endoscopic evaluation and would prefer conservative management. - Pantoprazole 40 Mg once daily for 30 days - Educated patient on lifestyle modifications including cessation of  alcohol, avoidance of acidic foods, not lying down soon after eating -- if continued symptoms, recommend EGD for further evaluation -- avoid NSAIDs  Assigned to Dr. Tomasa Rand today   Boone Master, PA-C [facility] 11/05/2023, 10:59 AM  Cc: Grayce Sessions, NP

## 2023-11-05 NOTE — Patient Instructions (Addendum)
 Your provider has requested that you go to the basement level for lab work before leaving today. Press "B" on the elevator. The lab is located at the first door on the left as you exit the elevator.  Due to recent changes in healthcare laws, you may see the results of your imaging and laboratory studies on MyChart before your provider has had a chance to review them.  We understand that in some cases there may be results that are confusing or concerning to you. Not all laboratory results come back in the same time frame and the provider may be waiting for multiple results in order to interpret others.  Please give [place] 48 hours in order for your provider to thoroughly review all the results before contacting the office for clarification of your results.   We have sent the following medications to your pharmacy for you to pick up at your convenience: Pantoprazole 40mg  once a day.  Please purchase the following medications over the counter and take as directed: Start a fiber supplement daily.  (Metamucil, Citrucel, or Benefiber)  Follow up in 8 weeks.   You have been scheduled for an abdominal ultrasound with elastography at [facility] (1st floor). Your appointment is scheduled for Thursday, 11/12/23 at 10:00am. Please arrive 15 minutes prior to your scheduled appointment for registration purposes. Make certain not to have anything to eat or drink after midnight prior to your procedure. Should you need to reschedule your appointment, you may contact radiology at [phone].  Liver Elastography Various chronic liver diseases such as hepatitis B, C, and fatty liver disease can lead to tissue damage and subsequent scar tissue formation. As the scar tissue accumulates, the liver loses some of its elasticity and becomes stiffer. Liver elastography involves the use of a surface ultrasound probe that delivers a low frequency pulse or shear wave to a small volume of liver tissue under the rib cage.  The transmission of the sound wave is completely painless. How Is a Liver Elastography Performed? The liver is located in the right upper abdomen under the rib cage. Patients are asked to lie flat on an examination table. A technician places the FibroScan probe between the ribs on the right side of the lower chest wall. A series of 10 painless pulses are then applied to the liver. The results are recorded on the equipment and an overall liver stiffness score is generated. This score is then interpreted by a qualified physician to predict the likelihood of advanced fibrosis or cirrhosis.  Patients are asked to wear loose clothing and should not consume any liquids or solids for a minimum of 4 hours before the test to increase the likelihood of obtaining reliable test results. The scan will take 10 to 15 minutes to complete, but patients should plan on being available for 30 minutes to allow time for preparation  Thank you for trusting me with your gastrointestinal care!   Boone Master, PA   _______________________________________________________  If your blood pressure at your visit was 140/90 or greater, please contact your primary care physician to follow up on this.  _______________________________________________________  If you are age 63 or older, your body mass index should be between 23-30. Your Body mass index is 46.17 kg/m. If this is out of the aforementioned range listed, please consider follow up with your Primary Care Provider.  If you are age 63 or younger, your body mass index should be between 19-25. Your Body mass index is 46.17 kg/m. If this  is out of the aformentioned range listed, please consider follow up with your Primary Care Provider.   ________________________________________________________  The [facility] providers would like to encourage you to use [facility] to communicate with providers for non-urgent requests or questions.  Due to long hold times on the  telephone, sending your provider a message by [facility] may be a faster and more efficient way to get a response.  Please allow 48 business hours for a response.  Please remember that this is for non-urgent requests.  _______________________________________________________

## 2023-11-05 NOTE — Patient Outreach (Signed)
 Care Coordination   11/05/2023 Name: Paula Giles MRN: [id number] DOB: [birth year]   Care Coordination Outreach Attempts:  An unsuccessful outreach was attempted for an appointment today.  Follow Up Plan:  Additional outreach attempts will be made to offer the patient complex care management information and services.   Encounter Outcome:  Patient reports she is busy at the time of call. States that she will call RN back. Patient provided with callback number.   Care Coordination Interventions:  No, not indicated    Ewing Schlein, RN MSN [place]  [facility] RN Care Manager Direct Dial: [phone]  Fax: [phone]

## 2023-11-05 NOTE — Telephone Encounter (Signed)
 Labs have not been resulted by PCP.  Sent message to PCP.   Did not see order for [facility]

## 2023-11-06 ENCOUNTER — Telehealth: Payer: Self-pay | Admitting: Gastroenterology

## 2023-11-06 NOTE — Telephone Encounter (Signed)
 Patient was called and informed that message for test results are not in at this moment.   Did not see prescription regarding Wegovy.   PCP will follow.

## 2023-11-06 NOTE — Telephone Encounter (Signed)
 Inbound call from patient requesting for pantoprazole medication to be sent to [facility] on [address]. States [facility] is out of stock. Please advise, thank you.

## 2023-11-09 ENCOUNTER — Telehealth (INDEPENDENT_AMBULATORY_CARE_PROVIDER_SITE_OTHER): Payer: Self-pay

## 2023-11-09 NOTE — Telephone Encounter (Signed)
 Pt contacted the office to get lab results.

## 2023-11-09 NOTE — Telephone Encounter (Signed)
 Please results labs ans pt is wanting to start wegovy

## 2023-11-10 ENCOUNTER — Encounter (INDEPENDENT_AMBULATORY_CARE_PROVIDER_SITE_OTHER): Payer: Self-pay | Admitting: Primary Care

## 2023-11-10 LAB — HEPATITIS B SURFACE ANTIGEN: Hepatitis B Surface Ag: NONREACTIVE

## 2023-11-10 LAB — IGG: IgG (Immunoglobin G), Serum: 1985 mg/dL — ABNORMAL HIGH (ref 600–1640)

## 2023-11-10 LAB — ALPHA-1-ANTITRYPSIN: A-1 Antitrypsin, Ser: 149 mg/dL (ref 83–199)

## 2023-11-10 LAB — CERULOPLASMIN: Ceruloplasmin: 34 mg/dL (ref 14–48)

## 2023-11-10 LAB — EXTRA SPECIMEN

## 2023-11-10 LAB — ANA: Anti Nuclear Antibody (ANA): NEGATIVE

## 2023-11-10 LAB — COPPER, SERUM

## 2023-11-10 LAB — HEPATITIS C ANTIBODY: Hepatitis C Ab: NONREACTIVE

## 2023-11-10 LAB — TISSUE TRANSGLUTAMINASE, IGA: (tTG) Ab, IgA: <1 U/mL

## 2023-11-10 LAB — IGA: Immunoglobulin A: 702 mg/dL — ABNORMAL HIGH (ref 47–310)

## 2023-11-10 LAB — HEPATITIS B SURFACE ANTIBODY,QUALITATIVE: Hep B S Ab: REACTIVE — AB

## 2023-11-10 LAB — ANTI-SMOOTH MUSCLE ANTIBODY, IGG: Actin (Smooth Muscle) Antibody (IGG): 35 U — ABNORMAL HIGH (ref <20)

## 2023-11-10 LAB — MITOCHONDRIAL ANTIBODIES: Mitochondrial M2 Ab, IgG: <=20 U (ref <=20.0)

## 2023-11-10 LAB — HEPATITIS A ANTIBODY, TOTAL: Hepatitis A AB,Total: NONREACTIVE

## 2023-11-10 NOTE — Telephone Encounter (Signed)
Patient viewed message via [place].

## 2023-11-11 NOTE — Telephone Encounter (Signed)
 Will forward to provider

## 2023-11-11 NOTE — Telephone Encounter (Signed)
 I spoke to Paula Giles and I advised her that she can contact [facility] and have them request the Pantoprazole [facility].  She asked when would she get a call about her lab results.  I explained that she she receives the results before the provider but once she reviews it, she will notify her.

## 2023-11-11 NOTE — Progress Notes (Signed)
 Agree with the assessment and plan as outlined by Boone Master, PA-C.  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  Would consider non-invasive testing for H. Pylori and celiac disease if patient hesitant about EGD, given her anemia and GI symptoms.

## 2023-11-12 ENCOUNTER — Ambulatory Visit (HOSPITAL_COMMUNITY)

## 2023-11-12 ENCOUNTER — Other Ambulatory Visit: Payer: Self-pay | Admitting: *Deleted

## 2023-11-12 DIAGNOSIS — R7989 Other specified abnormal findings of blood chemistry: Secondary | ICD-10-CM

## 2023-11-12 MED ORDER — PANTOPRAZOLE SODIUM 40 MG PO TBEC: 40.0000 mg | DELAYED_RELEASE_TABLET | Freq: Every day | ORAL | 2 refills | Status: DC

## 2023-11-12 NOTE — Addendum Note (Signed)
 Addended by: Richardson Chiquito on: 11/12/2023 10:12 AM   Modules accepted: Orders

## 2023-11-13 ENCOUNTER — Telehealth: Payer: Self-pay | Admitting: *Deleted

## 2023-11-13 NOTE — Progress Notes (Signed)
 Complex Care Management Care Guide Note  11/13/2023 Name: Paula Giles MRN: [id number] DOB: 1986-05-13  Paula Giles is a 38 y.o. year old female who is a primary care patient of Grayce Sessions, NP and is actively engaged with the care management team. I reached out to Paula Giles by phone today to assist with re-scheduling  with the RN Case Manager. Patient received resources from [employer] and does not wish to reschedule initial call with RNCM. No further outreach attempts will be made at this time.    Gwenevere Ghazi  [facility]  Value-Based Care Institute, [facility] Guide  Direct Dial: [phone]  Fax [phone]

## 2023-11-15 ENCOUNTER — Encounter (INDEPENDENT_AMBULATORY_CARE_PROVIDER_SITE_OTHER): Payer: Self-pay | Admitting: Primary Care

## 2023-11-16 ENCOUNTER — Encounter (INDEPENDENT_AMBULATORY_CARE_PROVIDER_SITE_OTHER): Payer: Self-pay | Admitting: Primary Care

## 2023-11-16 ENCOUNTER — Other Ambulatory Visit (INDEPENDENT_AMBULATORY_CARE_PROVIDER_SITE_OTHER): Payer: Self-pay | Admitting: Primary Care

## 2023-11-16 ENCOUNTER — Telehealth (INDEPENDENT_AMBULATORY_CARE_PROVIDER_SITE_OTHER): Payer: Self-pay

## 2023-11-16 ENCOUNTER — Other Ambulatory Visit (INDEPENDENT_AMBULATORY_CARE_PROVIDER_SITE_OTHER): Payer: Self-pay

## 2023-11-16 MED ORDER — WEGOVY 0.25 MG/0.5ML ~~LOC~~ SOAJ: 0.2500 mg | SUBCUTANEOUS | 0 refills | Status: DC

## 2023-11-16 NOTE — Telephone Encounter (Signed)
 Reached out to pt and made aware that she can come by the office to pick up a box of wegovy and we will have have her follow up with provider in 1 month to see how she is doing on the wegovy. Pt states she will come by Wednesday and I will show her how and to go over instructions. Pt doesn't have any questions or concerns

## 2023-11-17 ENCOUNTER — Telehealth: Payer: Self-pay

## 2023-11-17 NOTE — Telephone Encounter (Signed)
 Pharmacy Patient Advocate Encounter  Received notification from [facility] Medicaid that Prior Authorization for [facility] has been APPROVED from 11/16/2013 to 05/15/2024

## 2023-11-18 ENCOUNTER — Ambulatory Visit (HOSPITAL_COMMUNITY): Admission: RE | Admit: 2023-11-18 | Source: Ambulatory Visit

## 2023-11-23 ENCOUNTER — Ambulatory Visit (HOSPITAL_COMMUNITY)
Admission: RE | Admit: 2023-11-23 | Discharge: 2023-11-23 | Disposition: A | Discharge status: Home / Self Care | Source: Ambulatory Visit | Attending: Gastroenterology | Admitting: Gastroenterology

## 2023-11-23 DIAGNOSIS — R1013 Epigastric pain: Secondary | ICD-10-CM | POA: Diagnosis present

## 2023-11-23 DIAGNOSIS — K219 Gastro-esophageal reflux disease without esophagitis: Secondary | ICD-10-CM | POA: Diagnosis present

## 2023-11-23 DIAGNOSIS — R7989 Other specified abnormal findings of blood chemistry: Secondary | ICD-10-CM | POA: Diagnosis present

## 2023-11-23 DIAGNOSIS — K5900 Constipation, unspecified: Secondary | ICD-10-CM | POA: Diagnosis present

## 2023-11-23 DIAGNOSIS — R140 Abdominal distension (gaseous): Secondary | ICD-10-CM | POA: Insufficient documentation

## 2023-11-23 DIAGNOSIS — D649 Anemia, unspecified: Secondary | ICD-10-CM | POA: Insufficient documentation

## 2023-11-30 ENCOUNTER — Other Ambulatory Visit: Payer: Self-pay

## 2023-11-30 ENCOUNTER — Ambulatory Visit: Admitting: Medical

## 2023-11-30 ENCOUNTER — Encounter: Payer: Self-pay | Admitting: Medical

## 2023-11-30 VITALS — BP 109/76 | HR 88 | Wt 270.4 lb

## 2023-11-30 DIAGNOSIS — F419 Anxiety disorder, unspecified: Secondary | ICD-10-CM

## 2023-11-30 DIAGNOSIS — R4589 Other symptoms and signs involving emotional state: Secondary | ICD-10-CM

## 2023-11-30 DIAGNOSIS — N939 Abnormal uterine and vaginal bleeding, unspecified: Secondary | ICD-10-CM

## 2023-11-30 NOTE — Progress Notes (Signed)
   History:  Ms. Paula Giles is a 38 y.o. G1P1000 who presents to clinic today for follow-up from the ED. Patient was seen in the ED on 3/26 with pelvic pain. Pelvic US  was performed showing a 1.4 cm uterine fibroid. The patient reports a long history of AUB, however last 2 months have been regular. She continued to have heavy bleeding with clots and is taking an iron supplement for the resulting anemia. She states the iron supplement causes constipation. Last pap smear was 11/2020. Pain is sharp and intermittent lasting only a few seconds at a time. She denies pain now. She denies bleeding today. She does endorse constipation.   The following portions of the patient's history were reviewed and updated as appropriate: allergies, current medications, family history, past medical history, social history, past surgical history and problem list.  Review of Systems:  Review of Systems  Constitutional:  Negative for fever and malaise/fatigue.  Gastrointestinal:  Positive for constipation. Negative for abdominal pain, diarrhea, nausea and vomiting.  Genitourinary:        Neg - vaginal bleeding, discharge, pelvic pain      Objective:  Physical Exam BP 109/76   Pulse 88   Wt 270 lb 6 oz (122.6 kg)   LMP 10/17/2023 (Approximate)   BMI 46.41 kg/m  Physical Exam Constitutional:      General: She is not in acute distress.    Appearance: Normal appearance. She is obese.  Cardiovascular:     Rate and Rhythm: Normal rate.  Pulmonary:     Effort: Pulmonary effort is normal.  Abdominal:     General: Abdomen is flat.     Palpations: Abdomen is soft.  Skin:    General: Skin is warm and dry.     Findings: No erythema.  Neurological:     Mental Status: She is alert and oriented to person, place, and time.     Health Maintenance Due  Topic Date Due   DTaP/Tdap/Td (1 - Tdap) Never done   COVID-19 Vaccine (1 - 2024-25 season) Never done   Assessment & Plan:  1. Abnormal uterine bleeding  (Primary) - patient would like to discuss ablation with a surgeon - declines birth control options discussed today including IUD  2. Depressed mood - Ambulatory referral to Integrated Behavioral Health  3. Anxious mood - Ambulatory referral to Integrated Behavioral Health  4. Routine Health Maintenance  - patient due for pap smear, declines today, will have at next visit   Approximately 20 minutes of total time was spent with this patient on chart review, history taking, patient education and documentation   Return in about 4 weeks (around 12/28/2023) for follow-up surgery consult, possible ablation, any GYN surgeon  + pap smear.  Millard Alliance, PA-C 11/30/2023 11:36 AM

## 2023-12-01 ENCOUNTER — Telehealth: Payer: Self-pay | Admitting: *Deleted

## 2023-12-01 NOTE — Progress Notes (Signed)
 Complex Care Management Care Guide Note  12/01/2023 Name: RASHENA SKOK MRN: [id number] DOB: June 20, 1986  Hosea Mac is a 38 y.o. year old female who is a primary care patient of Marius Siemens, NP and is actively engaged with the care management team. I reached out to Hosea Mac by phone today to assist with re-scheduling  with the RN Case Manager.  Follow up plan: Patient declined to reschedule with RNCM. No further outreach attempts will be made at this time.   Barnie Bora  [facility]  Value-Based Care Institute, [facility] Guide  Direct Dial: [phone]  Fax [phone]

## 2023-12-04 ENCOUNTER — Ambulatory Visit (HOSPITAL_COMMUNITY)

## 2023-12-11 ENCOUNTER — Telehealth: Payer: Self-pay | Admitting: Gastroenterology

## 2023-12-11 ENCOUNTER — Encounter: Payer: Self-pay | Admitting: *Deleted

## 2023-12-11 NOTE — Telephone Encounter (Signed)
 Patient returning call.

## 2023-12-14 NOTE — Telephone Encounter (Signed)
 Spoke to patient to advise of results from elastography. She verbalizes understanding.

## 2023-12-18 ENCOUNTER — Other Ambulatory Visit: Payer: Self-pay | Admitting: Radiology

## 2023-12-18 DIAGNOSIS — R748 Abnormal levels of other serum enzymes: Secondary | ICD-10-CM

## 2023-12-22 ENCOUNTER — Ambulatory Visit (HOSPITAL_COMMUNITY): Admission: RE | Admit: 2023-12-22 | Source: Ambulatory Visit

## 2023-12-22 ENCOUNTER — Ambulatory Visit (HOSPITAL_COMMUNITY)

## 2023-12-23 ENCOUNTER — Other Ambulatory Visit (INDEPENDENT_AMBULATORY_CARE_PROVIDER_SITE_OTHER): Payer: Self-pay | Admitting: Primary Care

## 2023-12-23 NOTE — Telephone Encounter (Signed)
 Copied from CRM [id number]. Topic: Clinical - Medication Refill >> Dec 23, 2023  9:54 AM Paula Giles E wrote: Medication: Semaglutide -Weight Management (WEGOVY ) 0.25 MG/0.5ML SOAJ  Has the patient contacted their pharmacy? Yes (Agent: If no, request that the patient contact the pharmacy for the refill. If patient does not wish to contact the pharmacy document the reason why and proceed with request.) (Agent: If yes, when and what did the pharmacy advise?)  This is the patient's preferred pharmacy:  [id number] - [place], [place] - [address] AT [facility] [address] [place] [address] Phone: [phone] Fax: [phone]  Is this the correct pharmacy for this prescription? Yes If no, delete pharmacy and type the correct one.   Has the prescription been filled recently? Yes  Is the patient out of the medication? Yes  Has the patient been seen for an appointment in the last year OR does the patient have an upcoming appointment? Yes  Can we respond through MyChart? Yes  Agent: Please be advised that Rx refills may take up to 3 business days. We ask that you follow-up with your pharmacy.

## 2023-12-24 MED ORDER — WEGOVY 0.25 MG/0.5ML ~~LOC~~ SOAJ: 0.5000 mg | SUBCUTANEOUS | 1 refills | Status: DC

## 2023-12-24 NOTE — Telephone Encounter (Signed)
 Requested medications are due for refill today.  Unsure  Requested medications are on the active medications list.  yes  Last refill. unsure  Future visit scheduled.   no  Notes to clinic.  Medication is historical.    Requested Prescriptions  Pending Prescriptions Disp Refills   Semaglutide -Weight Management (WEGOVY ) 0.25 MG/0.5ML SOAJ 2 mL     Sig: Inject 0.25 mg into the skin.     Endocrinology:  Diabetes - GLP-1 Receptor Agonists - semaglutide  Failed - 12/24/2023  2:56 PM      Failed - HBA1C in normal range and within 180 days    Hgb A1c MFr Bld  Date Value Ref Range Status  05/15/2022 5.5 4.8 - 5.6 % Final    Comment:             Prediabetes: 5.7 - 6.4          Diabetes: >6.4          Glycemic control for adults with diabetes: <7.0          Failed - Valid encounter within last 6 months    Recent Outpatient Visits           1 month ago Anemia, unspecified type   [place] [facility] Marius Siemens, NP   10 months ago Adjustment disorder with mixed anxiety and depressed mood   [place] [facility] Marius Siemens, NP   1 year ago GAD (generalized anxiety disorder)   [place] [facility] Marius Siemens, NP   1 year ago Subacute maxillary sinusitis   [place] [facility] Marius Siemens, NP   2 years ago Encounter to establish care   [place] [facility] Marius Siemens, NP              Passed - Cr in normal range and within 360 days    Creatinine, Ser  Date Value Ref Range Status  11/05/2023 0.79 0.40 - 1.20 mg/dL Final

## 2023-12-24 NOTE — Telephone Encounter (Signed)
 Will forward to provider

## 2023-12-25 ENCOUNTER — Telehealth (INDEPENDENT_AMBULATORY_CARE_PROVIDER_SITE_OTHER): Payer: Self-pay | Admitting: Primary Care

## 2023-12-25 NOTE — Telephone Encounter (Signed)
 Copied from CRM [id number]. Topic: Clinical - Prescription Issue >> Dec 25, 2023  2:06 PM Elle L wrote: Reason for CRM: The patient states that the pharmacy stated there was an issue with her Semaglutide -Weight Management (WEGOVY ) 0.25 MG/0.5ML SOAJ prescription. She states she does not remember what they said the issue was.

## 2023-12-29 ENCOUNTER — Telehealth: Payer: Self-pay | Admitting: *Deleted

## 2023-12-29 NOTE — Telephone Encounter (Signed)
 Reached out to the pharmacy and spoke with pharmacist. The directions were wrong. Gave new directions inject 0.25mg  weekly. Per pharmacist since medication is ready for pick up will make change on their end.

## 2023-12-29 NOTE — Telephone Encounter (Signed)
 Copied from CRM [id number]. Topic: Clinical - Prescription Issue >> Dec 25, 2023  2:06 PM Elle L wrote: Reason for CRM: The patient states that the pharmacy stated there was an issue with her Semaglutide -Weight Management (WEGOVY ) 0.25 MG/0.5ML SOAJ prescription. She states she does not remember what they said the issue was.

## 2023-12-29 NOTE — Telephone Encounter (Signed)
 Duplicate message.

## 2023-12-29 NOTE — Telephone Encounter (Signed)
 Contacted pt and schedule her for June 9 for weigh in.

## 2023-12-31 ENCOUNTER — Ambulatory Visit: Admitting: Gastroenterology

## 2024-01-04 ENCOUNTER — Telehealth (INDEPENDENT_AMBULATORY_CARE_PROVIDER_SITE_OTHER): Payer: Self-pay | Admitting: Primary Care

## 2024-01-04 NOTE — Telephone Encounter (Signed)
 Called pt to confirm appt. Pt did not answer and LVM

## 2024-01-05 NOTE — Progress Notes (Deleted)
    GYNECOLOGY VISIT  Patient name: Paula Giles MRN [id number]  Date of birth: Mar 18, 1986 Chief Complaint:   No chief complaint on file.   History:  Paula Giles is a 38 y.o. G1P1000 being seen today for ***.    Last seen 11/30/23 by JW: ED follow up for pelvic pain heavy menses. Request to discuss ablation, declined birth control including IUD.   Past Medical History:  Diagnosis Date   Anemia    Anxiety    Arthritis    BV (bacterial vaginosis)    Depression    Hypertension    Irregular menstrual bleeding    Mood disorder (HCC)    Obesity    Seasonal allergies     Past Surgical History:  Procedure Laterality Date   CESAREAN SECTION  2009   WISDOM TOOTH EXTRACTION      The following portions of the patient's history were reviewed and updated as appropriate: allergies, current medications, past family history, past medical history, past social history, past surgical history and problem list.   Health Maintenance:   Last pap ***. Results were: {Pap findings:25134}. H/O abnormal pap: {yes/yes***/no:23866} Last mammogram: ***. Results were: {normal, abnormal, n/a:23837}. Family h/o breast cancer: {yes***/no:23838}   Review of Systems:  {Ros - complete:30496} Comprehensive review of systems was otherwise negative.   Objective:  Physical Exam There were no vitals taken for this visit.   Physical Exam   Labs and Imaging No results found.     Assessment & Plan:   There are no diagnoses linked to this encounter.   *** Routine preventative health maintenance measures emphasized.  Kiki Pelton, MD Minimally Invasive Gynecologic Surgery [facility], [facility]

## 2024-01-06 ENCOUNTER — Ambulatory Visit: Admitting: Obstetrics and Gynecology

## 2024-01-08 ENCOUNTER — Ambulatory Visit (HOSPITAL_COMMUNITY)

## 2024-01-11 ENCOUNTER — Ambulatory Visit (INDEPENDENT_AMBULATORY_CARE_PROVIDER_SITE_OTHER): Admitting: Primary Care

## 2024-01-14 ENCOUNTER — Other Ambulatory Visit: Payer: Self-pay | Admitting: Radiology

## 2024-01-14 DIAGNOSIS — R7989 Other specified abnormal findings of blood chemistry: Secondary | ICD-10-CM

## 2024-01-15 ENCOUNTER — Other Ambulatory Visit (INDEPENDENT_AMBULATORY_CARE_PROVIDER_SITE_OTHER): Payer: Self-pay | Admitting: Primary Care

## 2024-01-15 ENCOUNTER — Ambulatory Visit (HOSPITAL_COMMUNITY)

## 2024-02-02 NOTE — Progress Notes (Deleted)
 Paula Console, PA-C [address] [place], [place]  [address] Phone: [phone]   Primary Care Physician: Celestia Rosaline SQUIBB, NP  Primary Gastroenterologist:  Paula Console, PA-C / ***  Chief Complaint:  ***       HPI:   Paula Giles is a 38 y.o. female  Current Outpatient Medications  Medication Sig Dispense Refill   busPIRone  (BUSPAR ) 5 MG tablet Take 5 mg by mouth daily as needed.     cetirizine  (ZYRTEC ) 10 MG tablet Take 10 mg by mouth daily.     OVER THE COUNTER MEDICATION Iron one daily     pantoprazole  (PROTONIX ) 40 MG tablet Take 1 tablet (40 mg total) by mouth daily. 30 tablet 2   Semaglutide -Weight Management (WEGOVY ) 0.25 MG/0.5ML SOAJ Inject 0.5 mg into the skin once a week. 0.05 mL 1   valsartan -hydrochlorothiazide  (DIOVAN -HCT) 160-25 MG tablet Take 1 tablet by mouth daily. 90 tablet 1   No current facility-administered medications for this visit.    Allergies as of 02/03/2024 - Review Complete 11/30/2023  Allergen Reaction Noted   Latex Rash 06/19/2017    Past Medical History:  Diagnosis Date   Anemia    Anxiety    Arthritis    BV (bacterial vaginosis)    Depression    Hypertension    Irregular menstrual bleeding    Mood disorder (HCC)    Obesity    Seasonal allergies     Past Surgical History:  Procedure Laterality Date   CESAREAN SECTION  2009   WISDOM TOOTH EXTRACTION      Review of Systems:    All systems reviewed and negative except where noted in HPI.    Physical Exam:  There were no vitals taken for this visit. No LMP recorded. (Menstrual status: Irregular Periods).  General: Well-nourished, well-developed in no acute distress.  Lungs: Clear to auscultation bilaterally. Non-labored. Heart: Regular rate and rhythm, no murmurs rubs or gallops.  Abdomen: Bowel sounds are normal; Abdomen is Soft; No hepatosplenomegaly, masses or hernias;  No Abdominal Tenderness; No guarding or rebound tenderness. Neuro: Alert and oriented  x 3.  Grossly intact.  Psych: Alert and cooperative, normal mood and affect.   Imaging Studies: No results found.  Labs: CBC    Component Value Date/Time   WBC 6.9 11/05/2023 1100   RBC 4.13 11/05/2023 1100   HGB 11.8 (L) 11/05/2023 1100   HGB 11.8 11/03/2023 1116   HCT 36.2 11/05/2023 1100   HCT 38.7 11/03/2023 1116   PLT 309.0 11/05/2023 1100   PLT 364 11/03/2023 1116   MCV 87.6 11/05/2023 1100   MCV 90 11/03/2023 1116   MCH 27.6 11/03/2023 1116   MCH 27.5 10/28/2022 1845   MCHC 32.6 11/05/2023 1100   RDW 17.0 (H) 11/05/2023 1100   RDW 15.4 11/03/2023 1116   LYMPHSABS 1.8 11/05/2023 1100   LYMPHSABS 1.6 11/03/2023 1116   MONOABS 0.7 11/05/2023 1100   EOSABS 0.2 11/05/2023 1100   EOSABS 0.2 11/03/2023 1116   BASOSABS 0.1 11/05/2023 1100   BASOSABS 0.1 11/03/2023 1116    CMP     Component Value Date/Time   NA 136 11/05/2023 1100   NA 138 11/03/2023 1116   K 3.6 11/05/2023 1100   CL 98 11/05/2023 1100   CO2 28 11/05/2023 1100   GLUCOSE 94 11/05/2023 1100   BUN 11 11/05/2023 1100   BUN 10 11/03/2023 1116   CREATININE 0.79 11/05/2023 1100   CALCIUM 9.5 11/05/2023  1100   PROT 8.6 (H) 11/05/2023 1100   PROT 8.3 11/03/2023 1116   ALBUMIN 4.4 11/05/2023 1100   ALBUMIN 4.6 11/03/2023 1116   AST 77 (H) 11/05/2023 1100   ALT 67 (H) 11/05/2023 1100   ALKPHOS 67 11/05/2023 1100   BILITOT 0.4 11/05/2023 1100   BILITOT 0.3 11/03/2023 1116   GFRNONAA >60 10/28/2022 1656   GFRAA 130 08/09/2020 1533       Assessment and Plan:   Paula Giles is a 38 y.o. y/o female y/o female ***    Paula Console, PA-C  Follow up ***

## 2024-02-03 ENCOUNTER — Ambulatory Visit: Admitting: Physician Assistant

## 2024-02-08 ENCOUNTER — Other Ambulatory Visit: Payer: Self-pay | Admitting: Radiology

## 2024-02-09 ENCOUNTER — Ambulatory Visit (HOSPITAL_COMMUNITY)
Admission: RE | Admit: 2024-02-09 | Discharge: 2024-02-09 | Disposition: A | Discharge status: Home / Self Care | Source: Ambulatory Visit | Attending: Gastroenterology | Admitting: Gastroenterology

## 2024-02-09 ENCOUNTER — Other Ambulatory Visit: Payer: Self-pay

## 2024-02-09 ENCOUNTER — Encounter (HOSPITAL_COMMUNITY): Payer: Self-pay

## 2024-02-09 DIAGNOSIS — K7581 Nonalcoholic steatohepatitis (NASH): Secondary | ICD-10-CM | POA: Diagnosis not present

## 2024-02-09 DIAGNOSIS — R7989 Other specified abnormal findings of blood chemistry: Secondary | ICD-10-CM

## 2024-02-09 DIAGNOSIS — R748 Abnormal levels of other serum enzymes: Secondary | ICD-10-CM

## 2024-02-09 LAB — COMPREHENSIVE METABOLIC PANEL WITH GFR
ALT: 73 U/L — ABNORMAL HIGH (ref 0–44)
AST: 85 U/L — ABNORMAL HIGH (ref 15–41)
Albumin: 4 g/dL (ref 3.5–5.0)
Alkaline Phosphatase: 64 U/L (ref 38–126)
Anion gap: 11 (ref 5–15)
BUN: 11 mg/dL (ref 6–20)
CO2: 28 mmol/L (ref 22–32)
Calcium: 9.4 mg/dL (ref 8.9–10.3)
Chloride: 99 mmol/L (ref 98–111)
Creatinine, Ser: 0.76 mg/dL (ref 0.44–1.00)
GFR, Estimated: >60 mL/min (ref >60)
Glucose, Bld: 99 mg/dL (ref 70–99)
Potassium: 3.6 mmol/L (ref 3.5–5.1)
Sodium: 138 mmol/L (ref 135–145)
Total Bilirubin: 0.6 mg/dL (ref 0.0–1.2)
Total Protein: 8.7 g/dL — ABNORMAL HIGH (ref 6.5–8.1)

## 2024-02-09 LAB — CBC
HCT: 42.2 % (ref 36.0–46.0)
Hemoglobin: 14 g/dL (ref 12.0–15.0)
MCH: 29.5 pg (ref 26.0–34.0)
MCHC: 33.2 g/dL (ref 30.0–36.0)
MCV: 88.8 fL (ref 80.0–100.0)
Platelets: 273 K/uL (ref 150–400)
RBC: 4.75 MIL/uL (ref 3.87–5.11)
RDW: 19.2 % — ABNORMAL HIGH (ref 11.5–15.5)
WBC: 7.2 K/uL (ref 4.0–10.5)
nRBC: 0 % (ref 0.0–0.2)

## 2024-02-09 LAB — PROTIME-INR
INR: 1 (ref 0.8–1.2)
Prothrombin Time: 13.9 s (ref 11.4–15.2)

## 2024-02-09 MED ORDER — FENTANYL CITRATE (PF) 100 MCG/2ML IJ SOLN
INTRAMUSCULAR | Status: AC | PRN
  Administered 2024-02-09 (×2): 50 ug via INTRAVENOUS

## 2024-02-09 MED ORDER — FENTANYL CITRATE (PF) 100 MCG/2ML IJ SOLN
INTRAMUSCULAR | Status: AC
Start: 2024-02-09 — End: 2024-02-09
  Filled 2024-02-09: qty 2

## 2024-02-09 MED ORDER — SODIUM CHLORIDE 0.9 % IV SOLN: INTRAVENOUS | Status: DC

## 2024-02-09 MED ORDER — MIDAZOLAM HCL 2 MG/2ML IJ SOLN
INTRAMUSCULAR | Status: AC | PRN
  Administered 2024-02-09 (×2): 1 mg via INTRAVENOUS

## 2024-02-09 MED ORDER — MIDAZOLAM HCL 2 MG/2ML IJ SOLN
INTRAMUSCULAR | Status: AC
  Filled 2024-02-09: qty 2

## 2024-02-09 MED ORDER — LIDOCAINE HCL 1 % IJ SOLN
INTRAMUSCULAR | Status: AC
  Filled 2024-02-09: qty 20

## 2024-02-09 MED ORDER — OXYCODONE HCL 5 MG PO TABS: 10.0000 mg | ORAL_TABLET | ORAL | Status: DC | PRN

## 2024-02-09 NOTE — Procedures (Signed)
 Vascular and Interventional Radiology Procedure Note  Patient: Paula Giles DOB: 11-May-1986 Medical Record Number: [id number] Note Date/Time: 02/09/24 1:33 PM   Performing Physician: Thom Hall, MD Assistant(s): None  Diagnosis: >LFTs   Procedure: LIVER BIOPSY, NON-TARGETED   Anesthesia: Conscious Sedation Complications: None Estimated Blood Loss: Minimal Specimens: Sent for Pathology  Findings:  Successful Ultrasound-guided biopsy of liver. A total of 3 samples were obtained. Hemostasis of the tract was achieved using Manual Pressure.  Plan: Bed rest for 2 hours.  See detailed procedure note with images in PACS. The patient tolerated the procedure well without incident or complication and was returned to Recovery in stable condition.    Thom Hall, MD Vascular and Interventional Radiology [facility] Radiology   Pager. [phone] Clinic. [phone]

## 2024-02-09 NOTE — H&P (Addendum)
 Chief Complaint: Elevated liver function tests; referred for image guided random core liver biopsy  Referring Provider(s): Paula Giles  Supervising Physician: Paula Giles  Patient Status: [facility] - Out-pt  History of Present Illness: Paula Giles is a 38 y.o. female with past medical history significant for anemia, anxiety, arthritis, depression, GERD, hypertension, irregular menstrual bleeding/uterine fibroid, obesity, alcohol use and chronically elevated liver function tests with weakly positive ASMA, fatty liver by imaging.  She is scheduled today for image guided random core liver biopsy for further evaluation.   Patient is Full Code  Past Medical History:  Diagnosis Date   Anemia    Anxiety    Arthritis    BV (bacterial vaginosis)    Depression    Hypertension    Irregular menstrual bleeding    Mood disorder (HCC)    Obesity    Seasonal allergies     Past Surgical History:  Procedure Laterality Date   CESAREAN SECTION  2009   WISDOM TOOTH EXTRACTION      Allergies: Latex  Medications: Prior to Admission medications   Medication Sig Start Date End Date Taking? Authorizing Provider  cetirizine  (ZYRTEC ) 10 MG tablet Take 10 mg by mouth daily.   Yes [provider]  OVER THE COUNTER MEDICATION Iron one daily   Yes [provider]  pantoprazole  (PROTONIX ) 40 MG tablet Take 1 tablet (40 mg total) by mouth daily. 11/12/23  Yes McMichael, Bayley M, PA-C  valsartan -hydrochlorothiazide  (DIOVAN -HCT) 160-25 MG tablet Take 1 tablet by mouth daily. 11/03/23  Yes Celestia Rosaline SQUIBB, NP  busPIRone  (BUSPAR ) 5 MG tablet Take 5 mg by mouth daily as needed.    [provider]  Semaglutide -Weight Management (WEGOVY ) 0.25 MG/0.5ML SOAJ Inject 0.5 mg into the skin once a week. 12/24/23   Celestia Rosaline SQUIBB, NP     Family History  Problem Relation Age of Onset   Hypertension Mother    Congestive Heart Failure Mother    Hypertension Father     Irritable bowel syndrome Maternal Grandmother    Colon cancer Maternal Grandfather    Esophageal cancer Neg Hx     Social History   Socioeconomic History   Marital status: Single    Spouse name: Not on file   Number of children: 1   Years of education: Not on file   Highest education level: Not on file  Occupational History   Occupation: [employer]  Tobacco Use   Smoking status: Never   Smokeless tobacco: Never  Vaping Use   Vaping status: Never Used  Substance and Sexual Activity   Alcohol use: Yes    Alcohol/week: 9.0 standard drinks of alcohol    Types: 5 Glasses of wine, 4 Shots of liquor per week    Comment: daily wine   Drug use: No   Sexual activity: Not Currently  Other Topics Concern   Not on file  Social History Narrative   Not on file   Social Drivers of Health   Financial Resource Strain: Not on file  Food Insecurity: Food Insecurity Present (11/30/2023)   Hunger Vital Sign    Worried About Running Out of Food in the Last Year: Sometimes true    Ran Out of Food in the Last Year: Sometimes true  Transportation Needs: Unmet Transportation Needs (11/30/2023)   PRAPARE - Administrator, Civil Service (Medical): No    Lack of Transportation (Non-Medical): Yes  Physical Activity: Not on file  Stress: Not on  file  Social Connections: Unknown (06/11/2023)   Received [facility]   Social Network    Social Network: Not on file       Review of Systems denies fever, headache, chest pain, dyspnea, cough, nausea, vomiting; she does have pelvic and back discomfort as well as hx some irregular menstrual bleeding; she is anxious  Vital Signs: BP 136/67   Pulse 93   Temp 98.2 F (36.8 C) (Oral)   Resp 18   Ht 5' 4 (1.626 m)   Wt 270 lb (122.5 kg)   LMP 12/17/2023   SpO2 95%   BMI 46.35 kg/m   Advance Care Plan: no documents on file  Physical Exam awake, alert.  Chest clear to auscultation bilaterally.  Heart with regular rate and  rhythm.  Abdomen obese, soft, positive bowel sounds, mildly tender pelvic region to palpation.  No significant lower extremity edema  Imaging: No results found.  Labs:  CBC: Recent Labs    02/17/23 1508 11/03/23 1116 11/05/23 1100  WBC 7.4 6.3 6.9  HGB 8.2* 11.8 11.8*  HCT 28.2* 38.7 36.2  PLT 366 364 309.0    COAGS: Recent Labs    11/05/23 1100  INR 1.1*    BMP: Recent Labs    02/17/23 1508 11/03/23 1116 11/05/23 1100  NA 138 138 136  K 4.0 3.8 3.6  CL 99 99 98  CO2 25 21 28   GLUCOSE 82 88 94  BUN 10 10 11   CALCIUM 9.5 9.4 9.5  CREATININE 0.77 0.77 0.79    LIVER FUNCTION TESTS: Recent Labs    02/17/23 1508 11/03/23 1116 11/05/23 1100  BILITOT 0.3 0.3 0.4  AST 90* 80* 77*  ALT 73* 65* 67*  ALKPHOS 67 82 67  PROT 7.7 8.3 8.6*  ALBUMIN 4.2 4.6 4.4    TUMOR MARKERS: No results for input(s): AFPTM, CEA, CA199, CHROMGRNA in the last 8760 hours.  Assessment and Plan: 38 y.o. female with past medical history significant for anemia, anxiety, arthritis, depression, GERD, hypertension, irregular menstrual bleeding/uterine fibroid, obesity, alcohol use and chronically elevated liver function tests with weakly positive ASMA, fatty liver by imaging.  She is scheduled today for image guided random core liver biopsy for further evaluation.Risks and benefits of  was discussed with the patient/family  including, but not limited to bleeding, infection, damage to adjacent structures or low yield requiring additional tests.  All of the questions were answered and there is agreement to proceed.  Consent signed and in chart.  LABS PENDING  Thank you for allowing our service to participate in Paula Giles 's care.  Electronically Signed: D. Franky Rakers, PA-C   02/09/2024, 12:31 PM      I spent a total of  25 minutes   in face to face in clinical consultation, greater than 50% of which was counseling/coordinating care for image guided random core liver  biopsy

## 2024-02-09 NOTE — Discharge Instructions (Signed)
Discharge Instructions:   Please call Interventional Radiology clinic [phone] with any questions or concerns.  You may remove your dressing and shower tomorrow.  Liver Biopsy, Care After The following information offers guidance on how to care for yourself after your procedure. Your health care provider may also give you more specific instructions. If you have problems or questions, contact your health care provider. What can I expect after the procedure? After your procedure, it is common to: Have pain and soreness in the area where the biopsy was done. Have bruising around the area where the biopsy was done. Feel tired for 1-2 days. Follow these instructions at home: Medicines Take over-the-counter and prescription medicines only as told by your health care provider. If you were prescribed an antibiotic medicine, take it as told by your health care provider. Do not stop taking the antibiotic, even if you start to feel better. Do not take medicines, such as aspirin and ibuprofen, unless your doctor tells you to take them. Ask your health care provider if the medicine prescribed to you: Requires you to avoid driving or using machinery. Can cause constipation. You may need to take these actions to prevent or treat constipation: Drink enough fluid to keep your urine pale yellow. Take over-the-counter or prescription medicines. Eat foods that are high in fiber, such as beans, whole grains, and fresh fruits and vegetables. Limit foods that are high in fat and processed sugars, such as fried or sweet foods. Incision care Follow instructions from your health care provider about how to take care of your incisions. Make sure you: Wash your hands with soap and water for at least 20 seconds before and after you change your bandage (dressing). If soap and water are not available, use hand sanitizer. Change your dressing as told by your health care provider. Leave stitches (sutures), skin glue,  or adhesive strips in place. These skin closures may need to stay in place for 2 weeks or longer. If adhesive strip edges start to loosen and curl up, you may trim the loose edges. Do not remove adhesive strips completely unless your health care provider tells you to do that. Check your incision areas every day for signs of infection. Check for: Redness, swelling, or more pain. Fluid or blood. Warmth. Pus or a bad smell. Do not take baths, swim, or use a hot tub until your health care provider says it is okay to do so. Activity Rest at home for 1-2 days, or as directed by your health care provider. Avoid sitting for a long time without moving. Get up to take short walks every 1-2 hours. This is important to improve blood flow and breathing. Ask for help if you feel weak or unsteady. Do not lift anything that is heavier than 10 lb (4.5 kg), or the limit that your health care provider tells you, until he or she says that it is safe. Do not play contact sports for 2 weeks after the procedure. Return to your normal activities as told by your health care provider. Ask your health care provider what activities are safe for you. General instructions  Do not drink alcohol in the first week after the procedure. Plan to have a responsible adult care for you for the time you are told after you leave the hospital or clinic. This is important. It is up to you to get the results of your procedure. Ask your health care provider, or the department that is doing the procedure, when your results will  be ready. Keep all follow-up visits. This is important. Contact a health care provider if: You have increased bleeding from an incision. You have redness, swelling, or increasing pain in any incisions. You notice a discharge or a bad smell coming from any of your incisions. You develop a rash. You have a fever or chills. Get help right away if: You develop swelling, bloating, or pain in your abdomen. You become  dizzy or faint. You have nausea or vomiting. You have shortness of breath or difficulty breathing. You develop chest pain. You have problems with your speech or vision. You have trouble with your balance or moving your arms or legs. These symptoms may represent a serious problem that is an emergency. Do not wait to see if the symptoms will go away. Get medical help right away. Call your local emergency services (911 in the [place] Do not drive yourself to the hospital. Summary After the liver biopsy, it is common to have pain, soreness, and bruising in the area, as well as tiredness (fatigue). Take over-the-counter and prescription medicines only as told by your health care provider. Follow instructions from your health care provider about how to care for your incisions. Check your incision areas daily for signs of infection. This information is not intended to replace advice given to you by your health care provider. Make sure you discuss any questions you have with your health care provider. Document Revised: 06/04/2020 Document Reviewed: 06/04/2020 Elsevier Patient Education  [place].    Moderate Conscious Sedation, Adult, Care After This sheet gives you information about how to care for yourself after your procedure. Your health care provider may also give you more specific instructions. If you have problems or questions, contact your health care provider. What can I expect after the procedure? After the procedure, it is common to have: Sleepiness for several hours. Impaired judgment for several hours. Difficulty with balance. Vomiting if you eat too soon. Follow these instructions at home: For the time period you were told by your health care provider:     Rest. Do not participate in activities where you could fall or become injured. Do not drive or use machinery. Do not drink alcohol. Do not take sleeping pills or medicines that cause drowsiness. Do not make  important decisions or sign legal documents. Do not take care of children on your own. Eating and drinking  Follow the diet recommended by your health care provider. Drink enough fluid to keep your urine pale yellow. If you vomit: Drink water, juice, or soup when you can drink without vomiting. Make sure you have little or no nausea before eating solid foods. General instructions Take over-the-counter and prescription medicines only as told by your health care provider. Have a responsible adult stay with you for the time you are told. It is important to have someone help care for you until you are awake and alert. Do not smoke. Keep all follow-up visits as told by your health care provider. This is important. Contact a health care provider if: You are still sleepy or having trouble with balance after 24 hours. You feel light-headed. You keep feeling nauseous or you keep vomiting. You develop a rash. You have a fever. You have redness or swelling around the IV site. Get help right away if: You have trouble breathing. You have new-onset confusion at home. Summary After the procedure, it is common to feel sleepy, have impaired judgment, or feel nauseous if you eat too soon. Rest after  you get home. Know the things you should not do after the procedure. Follow the diet recommended by your health care provider and drink enough fluid to keep your urine pale yellow. Get help right away if you have trouble breathing or new-onset confusion at home. This information is not intended to replace advice given to you by your health care provider. Make sure you discuss any questions you have with your health care provider. Document Revised: 11/18/2019 Document Reviewed: 06/16/2019 Elsevier Patient Education  [place].

## 2024-02-12 LAB — SURGICAL PATHOLOGY

## 2024-02-15 ENCOUNTER — Ambulatory Visit: Payer: Self-pay | Admitting: *Deleted

## 2024-02-16 NOTE — Telephone Encounter (Signed)
 Patient is scheduled to see Stephane Quest, NP at [facility] on 03/08/24 at 10:30 am.

## 2024-02-17 NOTE — Progress Notes (Unsigned)
 [name], PA-C [address] [place], [place]  [address] Phone: [phone]   Primary Care Physician: Celestia Rosaline SQUIBB, NP  Primary Gastroenterologist:  [name], PA-C / Glendia Holt, MD   Chief Complaint:  F/U Elevated LFTs and anemia       HPI:   Paula Giles is a 38 y.o. female returns for 89-month follow-up of chronic anemia and elevated liver transaminases.  Daily alcohol use.  Patient last saw Con EMERSON Blower, PA-C 11/05/23 to evaluate anemia and elevated LFTs.  Elevated LFTs were thought due to alcohol and fatty liver.  No previous EGD or colonoscopy.  Was started on pantoprazole  40 Mg daily for GERD.  Told to avoid alcohol and NSAIDs.  Current symptoms: Patient states she was drinking 2 bottles of wine per day.  Now she has decreased to 1 bottle of wine per day.  She is currently seeing addiction specialist and is in rehab.  She sees Odella Haines, Publishing rights manager at [facility]. She is weaning off alcohol.  She has also been referred to Stephane Duncans, NP hepatologist to followup with liver disease and has appointment in August.  Acid reflux has improved.  No current GI symptoms.  Is on Wegovy  and is trying to lose weight.  02/09/2024 labs: CBC, CMP, PT/INR: Improved normal hemoglobin 14.0, HCT 42, MCV 88.  Normal platelet 273.  Improved AST 85, ALT 73.  Normal alk phos, bilirubin, and albumin.  Normal INR 1.0, PT 13.9.  11/2023 labs: Liver labs: Elevated ASMA (35) and IgG.  Negative ANA and AMA.  Immune to hepatitis B.  Not immune to hepatitis A.  HCV nonreactive.  All other liver labs unrevealing.  Celiac negative.  Mildly low iron.  Normal A-1-A and ceruloplasmin.  11/23/2023 liver ultrasound elastography: Diffuse fatty liver.  Median K PA 5.1.    02/09/2024 liver biopsy: Overlapping features of steatohepatitis and chronic hepatitis.  Chronically elevated LFTs over the last 3 years in a hepatocellular pattern with elevated AST/ALT as  high as AST 139/ALT 138 but most recent being AST 80/ALT 65.  Patient had liver workup 11/2020 which showed immunity to hepatitis B, no active hepatitis, normal ferritin/iron studies from a weak ASMA of 21, negative AMA, HCV negative  PMH: Anxiety, obesity, alcohol dependence, and irregular menstrual bleeding.  Current Outpatient Medications  Medication Sig Dispense Refill   busPIRone  (BUSPAR ) 5 MG tablet Take 5 mg by mouth daily as needed.     cetirizine  (ZYRTEC ) 10 MG tablet Take 10 mg by mouth daily.     OVER THE COUNTER MEDICATION Iron one daily     pantoprazole  (PROTONIX ) 40 MG tablet Take 1 tablet (40 mg total) by mouth daily. 30 tablet 2   Semaglutide -Weight Management (WEGOVY ) 0.25 MG/0.5ML SOAJ Inject 0.5 mg into the skin once a week. 0.05 mL 1   valsartan -hydrochlorothiazide  (DIOVAN -HCT) 160-25 MG tablet Take 1 tablet by mouth daily. 90 tablet 1   No current facility-administered medications for this visit.    Allergies as of 02/18/2024 - Review Complete 02/18/2024  Allergen Reaction Noted   Latex Rash 06/19/2017    Past Medical History:  Diagnosis Date   Anemia    Anxiety    Arthritis    BV (bacterial vaginosis)    Depression    Hypertension    Irregular menstrual bleeding    Mood disorder (HCC)    Obesity    Seasonal allergies     Past Surgical History:  Procedure Laterality Date   CESAREAN SECTION  2009   WISDOM TOOTH EXTRACTION      Review of Systems:    All systems reviewed and negative except where noted in HPI.    Physical Exam:  BP 110/80   Pulse 82   Ht 5' 4 (1.626 m)   Wt 276 lb (125.2 kg)   LMP 12/17/2023   BMI 47.38 kg/m  Patient's last menstrual period was 12/17/2023.  General: Well-nourished, well-developed in no acute distress.  Lungs: Clear to auscultation bilaterally. Non-labored. Heart: Regular rate and rhythm, no murmurs rubs or gallops.  Abdomen: Bowel sounds are normal; Abdomen is Soft; No hepatosplenomegaly, masses or hernias;   No Abdominal Tenderness; No guarding or rebound tenderness. Neuro: Alert and oriented x 3.  Grossly intact.  Psych: Alert and cooperative, normal mood and affect.   Imaging Studies: US  BIOPSY (LIVER) Result Date: 02/09/2024 INDICATION: elevated LFT'S, AMSA EXAM: ULTRASOUND GUIDED NON TARGETED LIVER BIOPSY COMPARISON:  ULTRASOUND LIVER ELASTOGRAPHY, 11/23/2023. CT AP, 01/14/2018. MEDICATIONS: None ANESTHESIA/SEDATION: Moderate (conscious) sedation was employed during this procedure. A total of Versed  2 mg and Fentanyl  100 mcg was administered intravenously. Moderate Sedation Time: 10 minutes. The patient's level of consciousness and vital signs were monitored continuously by radiology nursing throughout the procedure under my direct supervision. COMPLICATIONS: None immediate. PROCEDURE: Informed written consent was obtained from the patient and/or patient's representative after a discussion of the risks, benefits and alternatives to treatment. The patient understands and consents the procedure. A timeout was performed prior to the initiation of the procedure. Ultrasound scanning was performed of the right upper abdominal quadrant demonstrates echogenic appearance of liver The LEFT hepatic lobe was selected for biopsy and the procedure was planned. The right upper abdominal quadrant was prepped and draped in the usual sterile fashion. The overlying soft tissues were anesthetized with 1% lidocaine . A 17 gauge co-axial needle was advanced into a peripheral aspect of the lesion. This was followed by 4 core biopsies with an 18 gauge core device under direct ultrasound guidance. The needle was removed, then superficial hemostasis was obtained with manual compression. Post procedural scanning was negative for definitive area of hemorrhage or additional complication. A dressing was placed. The patient tolerated the procedure well without immediate post procedural complication. IMPRESSION: Successful ultrasound-guided  core needle biopsy of liver. Thom Hall, MD [facility] Electronically Signed   By: Thom Hall M.D.   On: 02/09/2024 15:33    Labs: CBC    Component Value Date/Time   WBC 7.2 02/09/2024 1210   RBC 4.75 02/09/2024 1210   HGB 14.0 02/09/2024 1210   HGB 11.8 11/03/2023 1116   HCT 42.2 02/09/2024 1210   HCT 38.7 11/03/2023 1116   PLT 273 02/09/2024 1210   PLT 364 11/03/2023 1116   MCV 88.8 02/09/2024 1210   MCV 90 11/03/2023 1116   MCH 29.5 02/09/2024 1210   MCHC 33.2 02/09/2024 1210   RDW 19.2 (H) 02/09/2024 1210   RDW 15.4 11/03/2023 1116   LYMPHSABS 1.8 11/05/2023 1100   LYMPHSABS 1.6 11/03/2023 1116   MONOABS 0.7 11/05/2023 1100   EOSABS 0.2 11/05/2023 1100   EOSABS 0.2 11/03/2023 1116   BASOSABS 0.1 11/05/2023 1100   BASOSABS 0.1 11/03/2023 1116    CMP     Component Value Date/Time   NA 138 02/09/2024 1210   NA 138 11/03/2023 1116   K 3.6 02/09/2024 1210   CL 99 02/09/2024 1210   CO2 28  02/09/2024 1210   GLUCOSE 99 02/09/2024 1210   BUN 11 02/09/2024 1210   BUN 10 11/03/2023 1116   CREATININE 0.76 02/09/2024 1210   CALCIUM 9.4 02/09/2024 1210   PROT 8.7 (H) 02/09/2024 1210   PROT 8.3 11/03/2023 1116   ALBUMIN 4.0 02/09/2024 1210   ALBUMIN 4.6 11/03/2023 1116   AST 85 (H) 02/09/2024 1210   ALT 73 (H) 02/09/2024 1210   ALKPHOS 64 02/09/2024 1210   BILITOT 0.6 02/09/2024 1210   BILITOT 0.3 11/03/2023 1116   GFRNONAA >60 02/09/2024 1210   GFRAA 130 08/09/2020 1533       Assessment and Plan:   VIKTORIYA GLASPY is a 38 y.o. y/o female returns for follow-up of:  1.  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  Recent positive ASMA.  Other labs unrevealing.  Liver biopsy showed steatohepatitis and chronic hepatitis.  She has been referred to liver specialist Stephane Quest, NP with appointment in August.  - Keep appointment with Stephane Quest, NP hepatologist.  2.  Alcohol  dependence: She has decreased from drinking 2 bottles of wine per day to 1 bottle of wine per day.  Currently seeing addiction specialist and is in rehab.  Weaning off alcohol. - Continue follow-up with rehab addiction specialist. - Encouraged complete abstinence from all alcohol.  3.  Hepatic steatosis: Currently on Wegovy  trying to lose weight. - Recommend a low-fat diet, regular exercise, and weight loss. - Patient education handout about fatty liver disease was given.  4.  Chronic anemia: Currently resolved.  Normal hemoglobin 14 g.  Celiac labs negative.   - Continue to monitor. - Continue PPI pantoprazole  to treat gastritis.  [name], PA-C  Follow up 3 months with Nestor Blower, PA-C or Dr. Stacia

## 2024-02-18 ENCOUNTER — Ambulatory Visit: Admitting: Physician Assistant

## 2024-02-18 ENCOUNTER — Telehealth: Payer: Self-pay | Admitting: Physician Assistant

## 2024-02-18 ENCOUNTER — Encounter: Payer: Self-pay | Admitting: Physician Assistant

## 2024-02-18 VITALS — BP 110/80 | HR 82 | Ht 64.0 in | Wt 276.0 lb

## 2024-02-18 DIAGNOSIS — K7581 Nonalcoholic steatohepatitis (NASH): Secondary | ICD-10-CM

## 2024-02-18 DIAGNOSIS — F1029 Alcohol dependence with unspecified alcohol-induced disorder: Secondary | ICD-10-CM

## 2024-02-18 DIAGNOSIS — K739 Chronic hepatitis, unspecified: Secondary | ICD-10-CM

## 2024-02-18 DIAGNOSIS — F1020 Alcohol dependence, uncomplicated: Secondary | ICD-10-CM

## 2024-02-18 DIAGNOSIS — D649 Anemia, unspecified: Secondary | ICD-10-CM | POA: Diagnosis not present

## 2024-02-18 NOTE — Patient Instructions (Signed)
 Follow up in 3 months   If your blood pressure at your visit was 140/90 or greater, please contact your primary care physician to follow up on this.  _______________________________________________________  If you are age 39 or older, your body mass index should be between 23-30. Your Body mass index is 47.38 kg/m. If this is out of the aforementioned range listed, please consider follow up with your Primary Care Provider.  If you are age 48 or younger, your body mass index should be between 19-25. Your Body mass index is 47.38 kg/m. If this is out of the aformentioned range listed, please consider follow up with your Primary Care Provider.   ________________________________________________________  The [facility] providers would like to encourage you to use MYCHART to communicate with providers for non-urgent requests or questions.  Due to long hold times on the telephone, sending your provider a message by [facility] may be a faster and more efficient way to get a response.  Please allow 48 business hours for a response.  Please remember that this is for non-urgent requests.  _______________________________________________________  Thank you for trusting me with your gastrointestinal care!   [name], PA-C

## 2024-02-18 NOTE — Telephone Encounter (Signed)
 I saw this patient 02/17/2024.  Patient requested that I send her most recent labs and liver biopsy results to Odella Haines, NP at [facility].  Fax [phone].  Phone [phone].  Patient is seeing this provider for alcohol addiction rehabilitation.  Please send labs and liver biopsy results for continuity of care. [name], PA-C

## 2024-02-19 ENCOUNTER — Telehealth (INDEPENDENT_AMBULATORY_CARE_PROVIDER_SITE_OTHER): Payer: Self-pay | Admitting: Primary Care

## 2024-02-19 NOTE — Telephone Encounter (Signed)
 Sent labs from 4-3 and 7-8 with liver biopsy and path to fax number below

## 2024-02-19 NOTE — Telephone Encounter (Signed)
 Called pt to confirm appt. Pt did not answer and LVM

## 2024-02-22 ENCOUNTER — Encounter (INDEPENDENT_AMBULATORY_CARE_PROVIDER_SITE_OTHER): Payer: Self-pay | Admitting: Primary Care

## 2024-02-22 ENCOUNTER — Ambulatory Visit (INDEPENDENT_AMBULATORY_CARE_PROVIDER_SITE_OTHER): Admitting: Primary Care

## 2024-02-22 VITALS — BP 124/84 | HR 84 | Resp 16 | Wt 276.0 lb

## 2024-02-22 DIAGNOSIS — L282 Other prurigo: Secondary | ICD-10-CM

## 2024-02-22 DIAGNOSIS — Z7689 Persons encountering health services in other specified circumstances: Secondary | ICD-10-CM

## 2024-02-22 DIAGNOSIS — G4733 Obstructive sleep apnea (adult) (pediatric): Secondary | ICD-10-CM | POA: Diagnosis not present

## 2024-02-22 DIAGNOSIS — R300 Dysuria: Secondary | ICD-10-CM | POA: Diagnosis not present

## 2024-02-22 DIAGNOSIS — G4700 Insomnia, unspecified: Secondary | ICD-10-CM | POA: Diagnosis not present

## 2024-02-22 LAB — POCT URINALYSIS DIP (CLINITEK)
Bilirubin, UA: NEGATIVE
Glucose, UA: NEGATIVE mg/dL
Ketones, POC UA: NEGATIVE mg/dL
Leukocytes, UA: NEGATIVE
Nitrite, UA: NEGATIVE
POC PROTEIN,UA: 30 — AB
Spec Grav, UA: 1.02 (ref 1.010–1.025)
Urobilinogen, UA: 0.2 U/dL
pH, UA: 6 (ref 5.0–8.0)

## 2024-02-22 MED ORDER — HYDROXYZINE HCL 10 MG PO TABS: 10.0000 mg | ORAL_TABLET | Freq: Three times a day (TID) | ORAL | 1 refills | Status: DC | PRN

## 2024-02-22 MED ORDER — WEGOVY 1 MG/0.5ML ~~LOC~~ SOAJ: 1.0000 mg | SUBCUTANEOUS | 2 refills | Status: AC

## 2024-02-22 NOTE — Progress Notes (Signed)
 [facility]  Paula Giles, is a 38 y.o. female  RDW:[id number]  FMW:[id number]  DOB - 1986-06-21  Chief Complaint  Patient presents with   Weight Management Screening    Pt states she has been on the same dose of wegovy  for the past 3 months and no changes   Sleep Apnea    Pt states her liver doctor wanted her to speak with pcp in regards to this        Subjective:   Paula Giles is a 38 y.o. female here today for several comorbidities and concerns.  Hypertension-well-controlled patient has No headache, No chest pain, No abdominal pain - No Nausea, No new weakness tingling or numbness, No Cough - shortness of breath heart regular rate  Patient presents with possible obstructive sleep apnea. Patent has a 2 years history of symptoms of daytime fatigue, morning fatigue, morning headache, and hypertension. Patient generally gets 3 or 4 hours of sleep per night, and states they generally have difficulty falling asleep, nightime awakenings, and difficulty falling back asleep if awakened. Snoring of moderate severity is present. Apneic episodes is present. Nasal obstruction is not present.  Patient has not had tonsillectomy.   Encounter for weight management Since starting on wegovy  for weight loss for 2 months. Patient states they have better variety, improved meal pattern, increased physical activity, and better food choices.  Initially when she first started Wegovy  and the first month she lost 7 pounds since then only 1 will titrate up to the next dose and encourage continuing to exercise and monitor diet. They deny Allergic reactions--skin rash, itching, hives, swelling of the face, lips, tongue, or throat, Change in vision Dehydration--increased thirst, dry mouth, feeling faint or lightheaded, headache, dark yellow or brown urine Fast or irregular heartbeat Gallbladder problems--severe stomach pain, nausea, vomiting, fever Kidney injury--decrease in the amount of urine,  swelling of the ankles, hands, or feet Pancreatitis--severe stomach pain that spreads to your back or gets worse after eating or when touched, fever, nausea, vomiting Thoughts of suicide or self-harm, worsening mood, feelings of depression Thyroid cancer--new mass or lump in the neck, pain or trouble swallowing, trouble breathing, hoarseness She did voice concerns about a new diagnosis of fatty liver and will be following up with a liver specialist referred by gastrology.   No problems updated.  Comprehensive ROS Pertinent positive and negative noted in HPI   Allergies  Allergen Reactions   Latex Rash    Past Medical History:  Diagnosis Date   Anemia    Anxiety    Arthritis    BV (bacterial vaginosis)    Depression    Hypertension    Irregular menstrual bleeding    Mood disorder (HCC)    Obesity    Seasonal allergies     Current Outpatient Medications on File Prior to Visit  Medication Sig Dispense Refill   busPIRone  (BUSPAR ) 5 MG tablet Take 5 mg by mouth daily as needed. (Patient not taking: Reported on 02/22/2024)     cetirizine  (ZYRTEC ) 10 MG tablet Take 10 mg by mouth daily.     OVER THE COUNTER MEDICATION Iron one daily     pantoprazole  (PROTONIX ) 40 MG tablet Take 1 tablet (40 mg total) by mouth daily. 30 tablet 2   valsartan -hydrochlorothiazide  (DIOVAN -HCT) 160-25 MG tablet Take 1 tablet by mouth daily. 90 tablet 1   No current facility-administered medications on file prior to visit.   Health Maintenance  Topic Date Due   DTaP/Tdap/Td vaccine (1 -  Tdap) Never done   Hepatitis B Vaccine (1 of 3 - 19+ 3-dose series) Never done   HPV Vaccine (1 - 3-dose SCDM series) Never done   COVID-19 Vaccine (1 - 2024-25 season) Never done   Flu Shot  03/04/2024   Pap with HPV screening  11/19/2025   Hepatitis C Screening  Completed   HIV Screening  Completed   Meningitis B Vaccine  Aged Out    Objective:   Vitals:   02/22/24 1127  BP: 124/84  Pulse: 84  Resp: 16   SpO2: 96%  Weight: 276 lb (125.2 kg)   BP Readings from Last 3 Encounters:  02/22/24 124/84  02/18/24 110/80  02/09/24 (!) 119/53      Physical Exam Vitals reviewed.  Constitutional:      Appearance: Normal appearance. She is obese.  HENT:     Head: Normocephalic.     Right Ear: Tympanic membrane, ear canal and external ear normal.     Left Ear: Tympanic membrane, ear canal and external ear normal.     Nose: Nose normal.     Mouth/Throat:     Mouth: Mucous membranes are moist.  Eyes:     Extraocular Movements: Extraocular movements intact.     Pupils: Pupils are equal, round, and reactive to light.  Cardiovascular:     Rate and Rhythm: Normal rate and regular rhythm.  Pulmonary:     Effort: Pulmonary effort is normal.     Breath sounds: Normal breath sounds.  Abdominal:     General: Bowel sounds are normal.     Palpations: Abdomen is soft.  Musculoskeletal:        General: Normal range of motion.     Cervical back: Normal range of motion and neck supple.  Skin:    General: Skin is warm and dry.  Neurological:     Mental Status: She is alert and oriented to person, place, and time.  Psychiatric:        Mood and Affect: Mood normal.        Behavior: Behavior normal.        Thought Content: Thought content normal.     Assessment & Plan  [name] was seen today for weight management screening and sleep apnea.  Diagnoses and all orders for this visit: Paula Giles was seen today for weight management screening and sleep apnea.  Diagnoses and all orders for this visit:  Encounter for weight management  Semaglutide -Weight Management (WEGOVY ) 1 MG/0.5ML SOAJ; Inject 1 mg into the skin once a week.  Pruritic rash -     hydrOXYzine  (ATARAX ) 10 MG tablet; Take 1 tablet (10 mg total) by mouth 3 (three) times daily as needed.  Other orders -     Semaglutide -Weight Management (WEGOVY ) 1 MG/0.5ML SOAJ; Inject 1 mg into the skin once a week.  OSA (obstructive sleep apnea) 2/2  Insomnia, unspecified type -     Ambulatory referral to Pulmonology  Other orders -     Semaglutide -Weight Management (WEGOVY ) 1 MG/0.5ML SOAJ; Inject 1 mg into the skin once a week.   Dysuria  Flank pain yesterday taking Tylenol  for the pain-low pain at this time Urinalysis    Patient have been counseled extensively about nutrition and exercise. Other issues discussed during this visit include: low cholesterol diet, weight control and daily exercise, foot care, annual eye examinations at Ophthalmology, importance of adherence with medications and regular follow-up. We also discussed long term complications of uncontrolled diabetes and hypertension.  Return in about 5 weeks (around 03/28/2024).  The patient was given clear instructions to go to ER or return to medical center if symptoms don't improve, worsen or new problems develop. The patient verbalized understanding. The patient was told to call to get lab results if they haven't heard anything in the next week.   This note has been created with Education officer, environmental. Any transcriptional errors are unintentional.   Paula SHAUNNA Bohr, NP 02/22/2024, 12:06 PM

## 2024-02-23 ENCOUNTER — Ambulatory Visit (INDEPENDENT_AMBULATORY_CARE_PROVIDER_SITE_OTHER): Payer: Self-pay | Admitting: Primary Care

## 2024-02-23 NOTE — Telephone Encounter (Signed)
 Will forward to provider

## 2024-02-28 NOTE — Progress Notes (Signed)
 Agree with the assessment and plan as outlined by [name], PA-C.  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  High threshold to start any medical therapy for AIH, but will defer to [facility].  Alcohol cessation by far the most important intervention for the patient.

## 2024-02-29 ENCOUNTER — Ambulatory Visit: Admitting: Obstetrics and Gynecology

## 2024-02-29 NOTE — Telephone Encounter (Signed)
 Will forward to provider

## 2024-03-13 ENCOUNTER — Other Ambulatory Visit (INDEPENDENT_AMBULATORY_CARE_PROVIDER_SITE_OTHER): Payer: Self-pay | Admitting: Primary Care

## 2024-03-28 ENCOUNTER — Ambulatory Visit (INDEPENDENT_AMBULATORY_CARE_PROVIDER_SITE_OTHER): Admitting: Primary Care

## 2024-04-06 ENCOUNTER — Ambulatory Visit: Admitting: Adult Health

## 2024-04-21 ENCOUNTER — Ambulatory Visit: Admitting: Obstetrics and Gynecology

## 2024-04-21 ENCOUNTER — Other Ambulatory Visit (INDEPENDENT_AMBULATORY_CARE_PROVIDER_SITE_OTHER): Payer: Self-pay | Admitting: Primary Care

## 2024-04-22 ENCOUNTER — Ambulatory Visit: Admitting: Obstetrics and Gynecology

## 2024-05-11 ENCOUNTER — Telehealth

## 2024-05-11 ENCOUNTER — Telehealth: Admitting: Physician Assistant

## 2024-05-11 DIAGNOSIS — B9689 Other specified bacterial agents as the cause of diseases classified elsewhere: Secondary | ICD-10-CM | POA: Diagnosis not present

## 2024-05-11 DIAGNOSIS — J019 Acute sinusitis, unspecified: Secondary | ICD-10-CM

## 2024-05-11 MED ORDER — AMOXICILLIN-POT CLAVULANATE 875-125 MG PO TABS: 1.0000 | ORAL_TABLET | Freq: Two times a day (BID) | ORAL | 0 refills | Status: DC

## 2024-05-11 MED ORDER — FLUTICASONE PROPIONATE 50 MCG/ACT NA SUSP: 2.0000 | Freq: Every day | NASAL | 0 refills | Status: AC

## 2024-05-11 NOTE — Patient Instructions (Signed)
 Paula Giles, thank you for joining Delon CHRISTELLA Dickinson, PA-C for today's virtual visit.  While this provider is not your primary care provider (PCP), if your PCP is located in our provider database this encounter information will be shared with them immediately following your visit.   A [facility] MyChart account gives you access to today's visit and all your visits, tests, and labs performed at [facility]  click here if you don't have a [facility] MyChart account or go to [email or web address]  Consent: (Patient) Paula Giles provided verbal consent for this virtual visit at the beginning of the encounter.  Current Medications:  Current Outpatient Medications:    amoxicillin -clavulanate (AUGMENTIN ) 875-125 MG tablet, Take 1 tablet by mouth 2 (two) times daily., Disp: 14 tablet, Rfl: 0   fluticasone  (FLONASE ) 50 MCG/ACT nasal spray, Place 2 sprays into both nostrils daily., Disp: 16 g, Rfl: 0   busPIRone  (BUSPAR ) 5 MG tablet, Take 5 mg by mouth daily as needed. (Patient not taking: Reported on 02/22/2024), Disp: , Rfl:    cetirizine  (ZYRTEC ) 10 MG tablet, Take 10 mg by mouth daily., Disp: , Rfl:    hydrOXYzine  (ATARAX ) 10 MG tablet, Take 1 tablet (10 mg total) by mouth 3 (three) times daily as needed., Disp: 60 tablet, Rfl: 1   OVER THE COUNTER MEDICATION, Iron one daily, Disp: , Rfl:    pantoprazole  (PROTONIX ) 40 MG tablet, Take 1 tablet (40 mg total) by mouth daily., Disp: 30 tablet, Rfl: 2   Semaglutide -Weight Management (WEGOVY ) 1 MG/0.5ML SOAJ, Inject 1 mg into the skin once a week., Disp: 0.5 mL, Rfl: 2   valsartan -hydrochlorothiazide  (DIOVAN -HCT) 160-25 MG tablet, Take 1 tablet by mouth daily., Disp: 90 tablet, Rfl: 1   Medications ordered in this encounter:  Meds ordered this encounter  Medications   amoxicillin -clavulanate (AUGMENTIN ) 875-125 MG tablet    Sig: Take 1 tablet by mouth 2 (two) times daily.    Dispense:  14 tablet    Refill:  0    Supervising  Provider:   LAMPTEY, PHILIP O [[id number]]   fluticasone  (FLONASE ) 50 MCG/ACT nasal spray    Sig: Place 2 sprays into both nostrils daily.    Dispense:  16 g    Refill:  0    Supervising Provider:   BLAISE ALEENE KIDD [[id number]]     *If you need refills on other medications prior to your next appointment, please contact your pharmacy*  Follow-Up: Call back or seek an in-person evaluation if the symptoms worsen or if the condition fails to improve as anticipated.  [facility] [phone]  Other Instructions Sinus Infection, Adult A sinus infection, also called sinusitis, is inflammation of your sinuses. Sinuses are hollow spaces in the bones around your face. Your sinuses are located: Around your eyes. In the middle of your forehead. Behind your nose. In your cheekbones. Mucus normally drains out of your sinuses. When your nasal tissues become inflamed or swollen, mucus can become trapped or blocked. This allows bacteria, viruses, and fungi to grow, which leads to infection. Most infections of the sinuses are caused by a virus. A sinus infection can develop quickly. It can last for up to 4 weeks (acute) or for more than 12 weeks (chronic). A sinus infection often develops after a cold. What are the causes? This condition is caused by anything that creates swelling in the sinuses or stops mucus from draining. This includes: Allergies. Asthma. Infection from bacteria or viruses. Deformities or blockages  in your nose or sinuses. Abnormal growths in the nose (nasal polyps). Pollutants, such as chemicals or irritants in the air. Infection from fungi. This is rare. What increases the risk? You are more likely to develop this condition if you: Have a weak body defense system (immune system). Do a lot of swimming or diving. Overuse nasal sprays. Smoke. What are the signs or symptoms? The main symptoms of this condition are pain and a feeling of pressure around the affected  sinuses. Other symptoms include: Stuffy nose or congestion that makes it difficult to breathe through your nose. Thick yellow or greenish drainage from your nose. Tenderness, swelling, and warmth over the affected sinuses. A cough that may get worse at night. Decreased sense of smell and taste. Extra mucus that collects in the throat or the back of the nose (postnasal drip) causing a sore throat or bad breath. Tiredness (fatigue). Fever. How is this diagnosed? This condition is diagnosed based on: Your symptoms. Your medical history. A physical exam. Tests to find out if your condition is acute or chronic. This may include: Checking your nose for nasal polyps. Viewing your sinuses using a device that has a light (endoscope). Testing for allergies or bacteria. Imaging tests, such as an MRI or CT scan. In rare cases, a bone biopsy may be done to rule out more serious types of fungal sinus disease. How is this treated? Treatment for a sinus infection depends on the cause and whether your condition is chronic or acute. If caused by a virus, your symptoms should go away on their own within 10 days. You may be given medicines to relieve symptoms. They include: Medicines that shrink swollen nasal passages (decongestants). A spray that eases inflammation of the nostrils (topical intranasal corticosteroids). Rinses that help get rid of thick mucus in your nose (nasal saline washes). Medicines that treat allergies (antihistamines). Over-the-counter pain relievers. If caused by bacteria, your health care provider may recommend waiting to see if your symptoms improve. Most bacterial infections will get better without antibiotic medicine. You may be given antibiotics if you have: A severe infection. A weak immune system. If caused by narrow nasal passages or nasal polyps, surgery may be needed. Follow these instructions at home: Medicines Take, use, or apply over-the-counter and prescription  medicines only as told by your health care provider. These may include nasal sprays. If you were prescribed an antibiotic medicine, take it as told by your health care provider. Do not stop taking the antibiotic even if you start to feel better. Hydrate and humidify  Drink enough fluid to keep your urine pale yellow. Staying hydrated will help to thin your mucus. Use a cool mist humidifier to keep the humidity level in your home above 50%. Inhale steam for 10-15 minutes, 3-4 times a day, or as told by your health care provider. You can do this in the bathroom while a hot shower is running. Limit your exposure to cool or dry air. Rest Rest as much as possible. Sleep with your head raised (elevated). Make sure you get enough sleep each night. General instructions  Apply a warm, moist washcloth to your face 3-4 times a day or as told by your health care provider. This will help with discomfort. Use nasal saline washes as often as told by your health care provider. Wash your hands often with soap and water to reduce your exposure to germs. If soap and water are not available, use hand sanitizer. Do not smoke. Avoid being around  people who are smoking (secondhand smoke). Keep all follow-up visits. This is important. Contact a health care provider if: You have a fever. Your symptoms get worse. Your symptoms do not improve within 10 days. Get help right away if: You have a severe headache. You have persistent vomiting. You have severe pain or swelling around your face or eyes. You have vision problems. You develop confusion. Your neck is stiff. You have trouble breathing. These symptoms may be an emergency. Get help right away. Call 911. Do not wait to see if the symptoms will go away. Do not drive yourself to the hospital. Summary A sinus infection is soreness and inflammation of your sinuses. Sinuses are hollow spaces in the bones around your face. This condition is caused by nasal  tissues that become inflamed or swollen. The swelling traps or blocks the flow of mucus. This allows bacteria, viruses, and fungi to grow, which leads to infection. If you were prescribed an antibiotic medicine, take it as told by your health care provider. Do not stop taking the antibiotic even if you start to feel better. Keep all follow-up visits. This is important. This information is not intended to replace advice given to you by your health care provider. Make sure you discuss any questions you have with your health care provider. Document Revised: 06/25/2021 Document Reviewed: 06/25/2021 Elsevier Patient Education  2024 Elsevier Inc.   If you have been instructed to have an in-person evaluation today at a local Urgent Care facility, please use the link below. It will take you to a list of all of our available [facility], including address, phone number and hours of operation. Please do not delay care.  [facility]  If you or a family member do not have a primary care provider, use the link below to schedule a visit and establish care. When you choose a [facility] primary care physician or advanced practice provider, you gain a long-term partner in health. Find a Primary Care Provider  Learn more about [facility] in-office and virtual care options: [facility] Get Care Now

## 2024-05-11 NOTE — Progress Notes (Signed)
 Virtual Visit Consent   Paula Giles Ada, you are scheduled for a virtual visit with a [facility] provider today. Just as with appointments in the office, your consent must be obtained to participate. Your consent will be active for this visit and any virtual visit you may have with one of our providers in the next 365 days. If you have a MyChart account, a copy of this consent can be sent to you electronically.  As this is a virtual visit, video technology does not allow for your provider to perform a traditional examination. This may limit your provider's ability to fully assess your condition. If your provider identifies any concerns that need to be evaluated in person or the need to arrange testing (such as labs, EKG, etc.), we will make arrangements to do so. Although advances in technology are sophisticated, we cannot ensure that it will always work on either your end or our end. If the connection with a video visit is poor, the visit may have to be switched to a telephone visit. With either a video or telephone visit, we are not always able to ensure that we have a secure connection.  By engaging in this virtual visit, you consent to the provision of healthcare and authorize for your insurance to be billed (if applicable) for the services provided during this visit. Depending on your insurance coverage, you may receive a charge related to this service.  I need to obtain your verbal consent now. Are you willing to proceed with your visit today? DORSIE SETHI has provided verbal consent on 05/11/2024 for a virtual visit (video or telephone). Delon CHRISTELLA Dickinson, PA-C  Date: 05/11/2024 4:34 PM   Virtual Visit via Video Note   I, Delon CHRISTELLA Dickinson, connected with  SAVANHA ISLAND  ([id number], 09/21/1985) on 05/11/24 at  4:30 PM EDT by a video-enabled telemedicine application and verified that I am speaking with the correct person using two identifiers.  Location: Patient: Virtual Visit Location  Patient: Home Provider: Virtual Visit Location Provider: Home Office   I discussed the limitations of evaluation and management by telemedicine and the availability of in person appointments. The patient expressed understanding and agreed to proceed.    History of Present Illness: Paula Giles is a 38 y.o. who identifies as a female who was assigned female at birth, and is being seen today for sinus congestion.  HPI: Sinusitis This is a new problem. The current episode started in the past 7 days. The problem has been gradually worsening since onset. There has been no fever. The pain is moderate. Associated symptoms include congestion, diaphoresis, headaches and sinus pressure. Pertinent negatives include no chills, coughing, ear pain, hoarse voice, neck pain or sore throat. (fatigue) Treatments tried: cetirizine  10mg , theraflu, alka seltzer cold and flu. The treatment provided mild (improves but returns) relief.     Problems:  Patient Active Problem List   Diagnosis Date Noted   Elevated LFTs 11/19/2020   Hypertension 08/09/2020   Normocytic anemia 08/09/2020   Pre-diabetes 08/09/2020   Obesity 08/09/2020   Generalized anxiety disorder with panic attacks 08/09/2020   MDD (major depressive disorder) 08/09/2020   Abnormal uterine bleeding 08/09/2020    Allergies:  Allergies  Allergen Reactions   Latex Rash   Medications:  Current Outpatient Medications:    amoxicillin -clavulanate (AUGMENTIN ) 875-125 MG tablet, Take 1 tablet by mouth 2 (two) times daily., Disp: 14 tablet, Rfl: 0   fluticasone  (FLONASE ) 50 MCG/ACT nasal spray, Place 2 sprays  into both nostrils daily., Disp: 16 g, Rfl: 0   busPIRone  (BUSPAR ) 5 MG tablet, Take 5 mg by mouth daily as needed. (Patient not taking: Reported on 02/22/2024), Disp: , Rfl:    cetirizine  (ZYRTEC ) 10 MG tablet, Take 10 mg by mouth daily., Disp: , Rfl:    hydrOXYzine  (ATARAX ) 10 MG tablet, Take 1 tablet (10 mg total) by mouth 3 (three) times daily  as needed., Disp: 60 tablet, Rfl: 1   OVER THE COUNTER MEDICATION, Iron one daily, Disp: , Rfl:    pantoprazole  (PROTONIX ) 40 MG tablet, Take 1 tablet (40 mg total) by mouth daily., Disp: 30 tablet, Rfl: 2   Semaglutide -Weight Management (WEGOVY ) 1 MG/0.5ML SOAJ, Inject 1 mg into the skin once a week., Disp: 0.5 mL, Rfl: 2   valsartan -hydrochlorothiazide  (DIOVAN -HCT) 160-25 MG tablet, Take 1 tablet by mouth daily., Disp: 90 tablet, Rfl: 1  Observations/Objective: Patient is well-developed, well-nourished in no acute distress.  Resting comfortably at home.  Head is normocephalic, atraumatic.  No labored breathing.  Speech is clear and coherent with logical content.  Patient is alert and oriented at baseline.    Assessment and Plan: 1. Acute bacterial sinusitis (Primary) - amoxicillin -clavulanate (AUGMENTIN ) 875-125 MG tablet; Take 1 tablet by mouth 2 (two) times daily.  Dispense: 14 tablet; Refill: 0 - fluticasone  (FLONASE ) 50 MCG/ACT nasal spray; Place 2 sprays into both nostrils daily.  Dispense: 16 g; Refill: 0  - Worsening symptoms that have not responded to OTC medications.  - Will give Augmentin  - Continue allergy medications.  - Steam and humidifier can help - Stay well hydrated and get plenty of rest.  - Seek in person evaluation if no symptom improvement or if symptoms worsen   Follow Up Instructions: I discussed the assessment and treatment plan with the patient. The patient was provided an opportunity to ask questions and all were answered. The patient agreed with the plan and demonstrated an understanding of the instructions.  A copy of instructions were sent to the patient via MyChart unless otherwise noted below.    The patient was advised to call back or seek an in-person evaluation if the symptoms worsen or if the condition fails to improve as anticipated.    Delon CHRISTELLA Dickinson, PA-C

## 2024-05-15 ENCOUNTER — Other Ambulatory Visit: Payer: Self-pay | Admitting: Gastroenterology

## 2024-05-18 ENCOUNTER — Ambulatory Visit: Admitting: Adult Health

## 2024-05-30 ENCOUNTER — Encounter: Admitting: Physician Assistant

## 2024-05-30 ENCOUNTER — Telehealth

## 2024-05-30 ENCOUNTER — Telehealth: Admitting: Family Medicine

## 2024-05-30 ENCOUNTER — Telehealth: Admitting: Physician Assistant

## 2024-05-30 DIAGNOSIS — J069 Acute upper respiratory infection, unspecified: Secondary | ICD-10-CM

## 2024-05-30 DIAGNOSIS — B9689 Other specified bacterial agents as the cause of diseases classified elsewhere: Secondary | ICD-10-CM

## 2024-05-30 DIAGNOSIS — J019 Acute sinusitis, unspecified: Secondary | ICD-10-CM

## 2024-05-30 MED ORDER — IPRATROPIUM BROMIDE 0.03 % NA SOLN: 2.0000 | Freq: Two times a day (BID) | NASAL | 0 refills | Status: DC

## 2024-05-30 MED ORDER — ALBUTEROL SULFATE HFA 108 (90 BASE) MCG/ACT IN AERS: 1.0000 | INHALATION_SPRAY | Freq: Four times a day (QID) | RESPIRATORY_TRACT | 0 refills | Status: DC | PRN

## 2024-05-30 MED ORDER — DOXYCYCLINE HYCLATE 100 MG PO TABS: 100.0000 mg | ORAL_TABLET | Freq: Two times a day (BID) | ORAL | 0 refills | Status: AC

## 2024-05-30 MED ORDER — PROMETHAZINE-DM 6.25-15 MG/5ML PO SYRP: 5.0000 mL | ORAL_SOLUTION | Freq: Four times a day (QID) | ORAL | 0 refills | Status: DC | PRN

## 2024-05-30 NOTE — Telephone Encounter (Signed)
 This encounter was created in error - please disregard.

## 2024-05-30 NOTE — Progress Notes (Signed)
 Virtual Visit Consent   Paula Giles, you are scheduled for a virtual visit with a [facility] provider today. Just as with appointments in the office, your consent must be obtained to participate. Your consent will be active for this visit and any virtual visit you may have with one of our providers in the next 365 days. If you have a MyChart account, a copy of this consent can be sent to you electronically.  As this is a virtual visit, video technology does not allow for your provider to perform a traditional examination. This may limit your provider's ability to fully assess your condition. If your provider identifies any concerns that need to be evaluated in person or the need to arrange testing (such as labs, EKG, etc.), we will make arrangements to do so. Although advances in technology are sophisticated, we cannot ensure that it will always work on either your end or our end. If the connection with a video visit is poor, the visit may have to be switched to a telephone visit. With either a video or telephone visit, we are not always able to ensure that we have a secure connection.  By engaging in this virtual visit, you consent to the provision of healthcare and authorize for your insurance to be billed (if applicable) for the services provided during this visit. Depending on your insurance coverage, you may receive a charge related to this service.  I need to obtain your verbal consent now. Are you willing to proceed with your visit today? Paula Giles has provided verbal consent on 05/30/2024 for a virtual visit (video or telephone). Delon CHRISTELLA Dickinson, PA-C  Date: 05/30/2024 2:43 PM   Virtual Visit via Video Note   I, Delon CHRISTELLA Dickinson, connected with  Paula Giles  ([id number], 11-Oct-1985) on 05/30/24 at  2:30 PM EDT by a video-enabled telemedicine application and verified that I am speaking with the correct person using two identifiers.  Location: Patient: Virtual Visit Location  Patient: Home Provider: Virtual Visit Location Provider: Home Office   I discussed the limitations of evaluation and management by telemedicine and the availability of in person appointments. The patient expressed understanding and agreed to proceed.    History of Present Illness: Paula Giles is a 38 y.o. who identifies as a female who was assigned female at birth, and is being seen today for URI symptoms.  HPI: URI  This is a recurrent problem. The current episode started in the past 7 days. The problem has been gradually worsening. There has been no fever. Associated symptoms include congestion, coughing (productive of mucus), diarrhea, headaches, rhinorrhea, sinus pain, a sore throat (in the morning) and swollen glands. Pertinent negatives include no ear pain, nausea, plugged ear sensation, vomiting or wheezing. Treatments tried: theraflu, alkaseltzer. The treatment provided no relief.    Had similar symptoms at the beginning of October for over a week and was seen, Virtually, on 05/11/24. Started on Augmentin  and Flonase . Symptoms resolved but then recurred in the last week with more chest congestion. Did an EV today and diagnosed with Viral URI and given Ipratrpoium bromide nasal spray. Followed up with Video visit to explain her symptoms in better detail.  Problems:  Patient Active Problem List   Diagnosis Date Noted   Elevated LFTs 11/19/2020   Hypertension 08/09/2020   Normocytic anemia 08/09/2020   Pre-diabetes 08/09/2020   Obesity 08/09/2020   Generalized anxiety disorder with panic attacks 08/09/2020   MDD (major depressive disorder) 08/09/2020  Abnormal uterine bleeding 08/09/2020    Allergies:  Allergies  Allergen Reactions   Latex Rash   Medications:  Current Outpatient Medications:    albuterol (VENTOLIN HFA) 108 (90 Base) MCG/ACT inhaler, Inhale 1-2 puffs into the lungs every 6 (six) hours as needed., Disp: 8 g, Rfl: 0   doxycycline  (VIBRA -TABS) 100 MG tablet, Take  1 tablet (100 mg total) by mouth 2 (two) times daily., Disp: 14 tablet, Rfl: 0   promethazine-dextromethorphan (PROMETHAZINE-DM) 6.25-15 MG/5ML syrup, Take 5 mLs by mouth 4 (four) times daily as needed., Disp: 118 mL, Rfl: 0   amoxicillin -clavulanate (AUGMENTIN ) 875-125 MG tablet, Take 1 tablet by mouth 2 (two) times daily., Disp: 14 tablet, Rfl: 0   cetirizine  (ZYRTEC ) 10 MG tablet, Take 10 mg by mouth daily., Disp: , Rfl:    fluticasone  (FLONASE ) 50 MCG/ACT nasal spray, Place 2 sprays into both nostrils daily., Disp: 16 g, Rfl: 0   hydrOXYzine  (ATARAX ) 10 MG tablet, Take 1 tablet (10 mg total) by mouth 3 (three) times daily as needed., Disp: 60 tablet, Rfl: 1   ipratropium (ATROVENT) 0.03 % nasal spray, Place 2 sprays into both nostrils every 12 (twelve) hours., Disp: 30 mL, Rfl: 0   OVER THE COUNTER MEDICATION, Iron one daily, Disp: , Rfl:    pantoprazole  (PROTONIX ) 40 MG tablet, TAKE 1 TABLET(40 MG) BY MOUTH DAILY, Disp: 30 tablet, Rfl: 2   Semaglutide -Weight Management (WEGOVY ) 1 MG/0.5ML SOAJ, Inject 1 mg into the skin once a week., Disp: 0.5 mL, Rfl: 2   valsartan -hydrochlorothiazide  (DIOVAN -HCT) 160-25 MG tablet, Take 1 tablet by mouth daily., Disp: 90 tablet, Rfl: 1  Observations/Objective: Patient is well-developed, well-nourished in no acute distress.  Resting comfortably at home.  Head is normocephalic, atraumatic.  No labored breathing.  Speech is clear and coherent with logical content.  Patient is alert and oriented at baseline.    Assessment and Plan: 1. Acute bacterial sinusitis (Primary) - doxycycline  (VIBRA -TABS) 100 MG tablet; Take 1 tablet (100 mg total) by mouth 2 (two) times daily.  Dispense: 14 tablet; Refill: 0 - promethazine-dextromethorphan (PROMETHAZINE-DM) 6.25-15 MG/5ML syrup; Take 5 mLs by mouth 4 (four) times daily as needed.  Dispense: 118 mL; Refill: 0 - albuterol (VENTOLIN HFA) 108 (90 Base) MCG/ACT inhaler; Inhale 1-2 puffs into the lungs every 6 (six)  hours as needed.  Dispense: 8 g; Refill: 0  - Worsening symptoms that have not responded to OTC medications.  - Will give Doxycycline  - Promethazine DM for cough and congestion - Albuterol for shortness of breath, chest tightness, wheezing - Continue allergy medications.  - Steam and humidifier can help - Stay well hydrated and get plenty of rest.  - Seek in person evaluation if no symptom improvement or if symptoms worsen   Follow Up Instructions: I discussed the assessment and treatment plan with the patient. The patient was provided an opportunity to ask questions and all were answered. The patient agreed with the plan and demonstrated an understanding of the instructions.  A copy of instructions were sent to the patient via MyChart unless otherwise noted below.    The patient was advised to call back or seek an in-person evaluation if the symptoms worsen or if the condition fails to improve as anticipated.    Delon CHRISTELLA Dickinson, PA-C

## 2024-05-30 NOTE — Progress Notes (Signed)

## 2024-05-30 NOTE — Progress Notes (Signed)
 Error, mom used her chart for an appt for her son.

## 2024-05-30 NOTE — Patient Instructions (Signed)
 Paula Giles, thank you for joining Delon CHRISTELLA Dickinson, PA-C for today's virtual visit.  While this provider is not your primary care provider (PCP), if your PCP is located in our provider database this encounter information will be shared with them immediately following your visit.   [name] MyChart account gives you access to today's visit and all your visits, tests, and labs performed at [facility]  click here if you don't have a [facility] MyChart account or go to [email or web address]  Consent: (Patient) Paula Giles provided verbal consent for this virtual visit at the beginning of the encounter.  Current Medications:  Current Outpatient Medications:    albuterol (VENTOLIN HFA) 108 (90 Base) MCG/ACT inhaler, Inhale 1-2 puffs into the lungs every 6 (six) hours as needed., Disp: 8 g, Rfl: 0   doxycycline  (VIBRA -TABS) 100 MG tablet, Take 1 tablet (100 mg total) by mouth 2 (two) times daily., Disp: 14 tablet, Rfl: 0   promethazine-dextromethorphan (PROMETHAZINE-DM) 6.25-15 MG/5ML syrup, Take 5 mLs by mouth 4 (four) times daily as needed., Disp: 118 mL, Rfl: 0   amoxicillin -clavulanate (AUGMENTIN ) 875-125 MG tablet, Take 1 tablet by mouth 2 (two) times daily., Disp: 14 tablet, Rfl: 0   cetirizine  (ZYRTEC ) 10 MG tablet, Take 10 mg by mouth daily., Disp: , Rfl:    fluticasone  (FLONASE ) 50 MCG/ACT nasal spray, Place 2 sprays into both nostrils daily., Disp: 16 g, Rfl: 0   hydrOXYzine  (ATARAX ) 10 MG tablet, Take 1 tablet (10 mg total) by mouth 3 (three) times daily as needed., Disp: 60 tablet, Rfl: 1   ipratropium (ATROVENT) 0.03 % nasal spray, Place 2 sprays into both nostrils every 12 (twelve) hours., Disp: 30 mL, Rfl: 0   OVER THE COUNTER MEDICATION, Iron one daily, Disp: , Rfl:    pantoprazole  (PROTONIX ) 40 MG tablet, TAKE 1 TABLET(40 MG) BY MOUTH DAILY, Disp: 30 tablet, Rfl: 2   Semaglutide -Weight Management (WEGOVY ) 1 MG/0.5ML SOAJ, Inject 1 mg into the skin once  a week., Disp: 0.5 mL, Rfl: 2   valsartan -hydrochlorothiazide  (DIOVAN -HCT) 160-25 MG tablet, Take 1 tablet by mouth daily., Disp: 90 tablet, Rfl: 1   Medications ordered in this encounter:  Meds ordered this encounter  Medications   doxycycline  (VIBRA -TABS) 100 MG tablet    Sig: Take 1 tablet (100 mg total) by mouth 2 (two) times daily.    Dispense:  14 tablet    Refill:  0    Supervising Provider:   LAMPTEY, PHILIP O [[id number]]   promethazine-dextromethorphan (PROMETHAZINE-DM) 6.25-15 MG/5ML syrup    Sig: Take 5 mLs by mouth 4 (four) times daily as needed.    Dispense:  118 mL    Refill:  0    Supervising Provider:   BLAISE ALEENE KIDD [[id number]]   albuterol (VENTOLIN HFA) 108 (90 Base) MCG/ACT inhaler    Sig: Inhale 1-2 puffs into the lungs every 6 (six) hours as needed.    Dispense:  8 g    Refill:  0    Supervising Provider:   BLAISE ALEENE KIDD [[id number]]     *If you need refills on other medications prior to your next appointment, please contact your pharmacy*  Follow-Up: Call back or seek an in-person evaluation if the symptoms worsen or if the condition fails to improve as anticipated.  [facility] [phone]  Other Instructions Upper Respiratory Infection, Adult An upper respiratory infection (URI) is a common viral infection of the nose, throat, and upper air passages that  lead to the lungs. The most common type of URI is the common cold. URIs usually get better on their own, without medical treatment. What are the causes? A URI is caused by a virus. You may catch a virus by: Breathing in droplets from an infected person's cough or sneeze. Touching something that has been exposed to the virus (is contaminated) and then touching your mouth, nose, or eyes. What increases the risk? You are more likely to get a URI if: You are very young or very old. You have close contact with others, such as at work, school, or a health care facility. You smoke. You have  long-term (chronic) heart or lung disease. You have a weakened disease-fighting system (immune system). You have nasal allergies or asthma. You are experiencing a lot of stress. You have poor nutrition. What are the signs or symptoms? A URI usually involves some of the following symptoms: Runny or stuffy (congested) nose. Cough. Sneezing. Sore throat. Headache. Fatigue. Fever. Loss of appetite. Pain in your forehead, behind your eyes, and over your cheekbones (sinus pain). Muscle aches. Redness or irritation of the eyes. Pressure in the ears or face. How is this diagnosed? This condition may be diagnosed based on your medical history and symptoms, and a physical exam. Your health care provider may use a swab to take a mucus sample from your nose (nasal swab). This sample can be tested to determine what virus is causing the illness. How is this treated? URIs usually get better on their own within 7-10 days. Medicines cannot cure URIs, but your health care provider may recommend certain medicines to help relieve symptoms, such as: Over-the-counter cold medicines. Cough suppressants. Coughing is a type of defense against infection that helps to clear the respiratory system, so take these medicines only as recommended by your health care provider. Fever-reducing medicines. Follow these instructions at home: Activity Rest as needed. If you have a fever, stay home from work or school until your fever is gone or until your health care provider says your URI cannot spread to other people (is no longer contagious). Your health care provider may have you wear a face mask to prevent your infection from spreading. Relieving symptoms Gargle with a mixture of salt and water 3-4 times a day or as needed. To make salt water, completely dissolve -1 tsp (3-6 g) of salt in 1 cup (237 mL) of warm water. Use a cool-mist humidifier to add moisture to the air. This can help you breathe more easily. Eating  and drinking  Drink enough fluid to keep your urine pale yellow. Eat soups and other clear broths. General instructions  Take over-the-counter and prescription medicines only as told by your health care provider. These include cold medicines, fever reducers, and cough suppressants. Do not use any products that contain nicotine or tobacco. These products include cigarettes, chewing tobacco, and vaping devices, such as e-cigarettes. If you need help quitting, ask your health care provider. Stay away from secondhand smoke. Stay up to date on all immunizations, including the yearly (annual) flu vaccine. Keep all follow-up visits. This is important. How to prevent the spread of infection to others URIs can be contagious. To prevent the infection from spreading: Wash your hands with soap and water for at least 20 seconds. If soap and water are not available, use hand sanitizer. Avoid touching your mouth, face, eyes, or nose. Cough or sneeze into a tissue or your sleeve or elbow instead of into your hand or into  the air.  Contact a health care provider if: You are getting worse instead of better. You have a fever or chills. Your mucus is brown or red. You have yellow or brown discharge coming from your nose. You have pain in your face, especially when you bend forward. You have swollen neck glands. You have pain while swallowing. You have white areas in the back of your throat. Get help right away if: You have shortness of breath that gets worse. You have severe or persistent: Headache. Ear pain. Sinus pain. Chest pain. You have chronic lung disease along with any of the following: Making high-pitched whistling sounds when you breathe, most often when you breathe out (wheezing). Prolonged cough (more than 14 days). Coughing up blood. A change in your usual mucus. You have a stiff neck. You have changes in your: Vision. Hearing. Thinking. Mood. These symptoms may be an emergency.  Get help right away. Call 911. Do not wait to see if the symptoms will go away. Do not drive yourself to the hospital. Summary An upper respiratory infection (URI) is a common infection of the nose, throat, and upper air passages that lead to the lungs. A URI is caused by a virus. URIs usually get better on their own within 7-10 days. Medicines cannot cure URIs, but your health care provider may recommend certain medicines to help relieve symptoms. This information is not intended to replace advice given to you by your health care provider. Make sure you discuss any questions you have with your health care provider. Document Revised: 02/20/2021 Document Reviewed: 02/20/2021 Elsevier Patient Education  2024 Elsevier Inc.   If you have been instructed to have an in-person evaluation today at a local Urgent Care facility, please use the link below. It will take you to a list of all of our available  Urgent Cares, including address, phone number and hours of operation. Please do not delay care.  [facility]  If you or a family member do not have a primary care provider, use the link below to schedule a visit and establish care. When you choose a [facility] primary care physician or advanced practice provider, you gain a long-term partner in health. Find a Primary Care Provider  Learn more about [facility]'s in-office and virtual care options: [facility] - Get Care Now

## 2024-06-06 ENCOUNTER — Ambulatory Visit: Admitting: Obstetrics and Gynecology

## 2024-06-07 ENCOUNTER — Other Ambulatory Visit (INDEPENDENT_AMBULATORY_CARE_PROVIDER_SITE_OTHER): Payer: Self-pay | Admitting: Primary Care

## 2024-06-07 ENCOUNTER — Ambulatory Visit (INDEPENDENT_AMBULATORY_CARE_PROVIDER_SITE_OTHER): Admitting: Primary Care

## 2024-06-07 ENCOUNTER — Encounter (INDEPENDENT_AMBULATORY_CARE_PROVIDER_SITE_OTHER): Payer: Self-pay | Admitting: Primary Care

## 2024-06-07 VITALS — BP 123/82 | HR 94 | Resp 16 | Wt 292.4 lb

## 2024-06-07 DIAGNOSIS — J019 Acute sinusitis, unspecified: Secondary | ICD-10-CM

## 2024-06-07 DIAGNOSIS — L282 Other prurigo: Secondary | ICD-10-CM | POA: Diagnosis not present

## 2024-06-07 DIAGNOSIS — B9689 Other specified bacterial agents as the cause of diseases classified elsewhere: Secondary | ICD-10-CM

## 2024-06-07 DIAGNOSIS — G4700 Insomnia, unspecified: Secondary | ICD-10-CM

## 2024-06-07 DIAGNOSIS — I10 Essential (primary) hypertension: Secondary | ICD-10-CM | POA: Diagnosis not present

## 2024-06-07 DIAGNOSIS — Z713 Dietary counseling and surveillance: Secondary | ICD-10-CM | POA: Diagnosis not present

## 2024-06-07 MED ORDER — ALBUTEROL SULFATE HFA 108 (90 BASE) MCG/ACT IN AERS
1.0000 | INHALATION_SPRAY | Freq: Four times a day (QID) | RESPIRATORY_TRACT | 0 refills | Status: AC | PRN
Start: 2024-06-07 — End: ?

## 2024-06-07 MED ORDER — TRAZODONE HCL 50 MG PO TABS: 25.0000 mg | ORAL_TABLET | Freq: Every evening | ORAL | 1 refills | Status: AC | PRN

## 2024-06-07 MED ORDER — HYDROXYZINE HCL 10 MG PO TABS: 10.0000 mg | ORAL_TABLET | Freq: Three times a day (TID) | ORAL | 1 refills | Status: AC | PRN

## 2024-06-07 MED ORDER — VALSARTAN-HYDROCHLOROTHIAZIDE 160-25 MG PO TABS: 1.0000 | ORAL_TABLET | Freq: Every day | ORAL | 1 refills | Status: AC

## 2024-06-07 NOTE — Progress Notes (Signed)
 Medication Samples have been provided to the patient.  Drug name: wegovy      Strength: 0.25mg         Qty: 1 box LOT: [id number]  Exp.Date: 06/03/2025  Dosing instructions: inject 0.25mg  once a week   The patient has been instructed regarding the correct time, dose, and frequency of taking this medication, including desired effects and most common side effects.   Paula Giles 4:01 PM 06/07/2024

## 2024-06-07 NOTE — Progress Notes (Signed)
 [facility]  Paula Giles, is a 38 y.o. female  RDW:[id number]  FMW:[id number]  DOB - March 18, 1986  Chief Complaint  Patient presents with   Hypertension   Weight Management Screening       Subjective:   Paula Giles is a 38 y.o. female here today for an acute visit.  Hypertension well-controlled. Patient has No headache, No chest pain, No abdominal pain - No Nausea, No new weakness tingling or numbness, No Cough - shortness of breath.  Patient would also like to discuss weight loss management.  She also complains of itchy rash, watery eyes pain behind eyes and congestion  HPI  No problems updated.  Comprehensive ROS Pertinent positive and negative noted in HPI   Allergies  Allergen Reactions   Latex Rash    Past Medical History:  Diagnosis Date   Anemia    Anxiety    Arthritis    BV (bacterial vaginosis)    Depression    Hypertension    Irregular menstrual bleeding    Mood disorder    Obesity    Seasonal allergies     Current Outpatient Medications on File Prior to Visit  Medication Sig Dispense Refill   albuterol (VENTOLIN HFA) 108 (90 Base) MCG/ACT inhaler Inhale 1-2 puffs into the lungs every 6 (six) hours as needed. 8 g 0   doxycycline  (VIBRA -TABS) 100 MG tablet Take 1 tablet (100 mg total) by mouth 2 (two) times daily. 14 tablet 0   fluticasone  (FLONASE ) 50 MCG/ACT nasal spray Place 2 sprays into both nostrils daily. 16 g 0   hydrOXYzine  (ATARAX ) 10 MG tablet Take 1 tablet (10 mg total) by mouth 3 (three) times daily as needed. 60 tablet 1   valsartan -hydrochlorothiazide  (DIOVAN -HCT) 160-25 MG tablet Take 1 tablet by mouth daily. 90 tablet 1   Semaglutide -Weight Management (WEGOVY ) 1 MG/0.5ML SOAJ Inject 1 mg into the skin once a week. (Patient not taking: Reported on 06/07/2024) 0.5 mL 2   No current facility-administered medications on file prior to visit.   Health Maintenance  Topic Date Due   DTaP/Tdap/Td vaccine (1 - Tdap) Never done    Pneumococcal Vaccine (1 of 2 - PCV) Never done   Hepatitis B Vaccine (1 of 3 - 19+ 3-dose series) Never done   HPV Vaccine (1 - 3-dose SCDM series) Never done   COVID-19 Vaccine (1 - 2025-26 season) Never done   Flu Shot  11/01/2024*   Pap with HPV screening  11/19/2025   Hepatitis C Screening  Completed   HIV Screening  Completed   Meningitis B Vaccine  Aged Out  *Topic was postponed. The date shown is not the original due date.    Objective:   Vitals:   06/07/24 1456  BP: 123/82  Pulse: 94  Resp: 16  SpO2: 96%  Weight: 292 lb 6.4 oz (132.6 kg)   BP Readings from Last 3 Encounters:  06/07/24 123/82  02/22/24 124/84  02/18/24 110/80      Physical Exam Vitals reviewed.  Constitutional:      Appearance: Normal appearance. She is obese.  HENT:     Head: Normocephalic.     Comments: Frontal and maxillary tenderness    Right Ear: Tympanic membrane, ear canal and external ear normal.     Left Ear: Tympanic membrane, ear canal and external ear normal.     Nose: Congestion and rhinorrhea present.     Mouth/Throat:     Mouth: Mucous membranes are moist.  Eyes:  Extraocular Movements: Extraocular movements intact.     Pupils: Pupils are equal, round, and reactive to light.  Cardiovascular:     Rate and Rhythm: Normal rate.  Pulmonary:     Effort: Pulmonary effort is normal.     Breath sounds: Normal breath sounds.  Abdominal:     General: Bowel sounds are normal.     Palpations: Abdomen is soft.  Musculoskeletal:        General: Normal range of motion.     Cervical back: Normal range of motion.  Skin:    General: Skin is warm and dry.  Neurological:     Mental Status: She is alert and oriented to person, place, and time.  Psychiatric:        Mood and Affect: Mood normal.        Behavior: Behavior normal.        Thought Content: Thought content normal.       Assessment & Plan  Paula Giles was seen today for hypertension and weight management  screening.  Diagnoses and all orders for this visit:  Weight loss counseling, encounter for Sample of Wegovy  given  Acute bacterial sinusitis -     albuterol (VENTOLIN HFA) 108 (90 Base) MCG/ACT inhaler; Inhale 1-2 puffs into the lungs every 6 (six) hours as needed.  Pruritic rash -     hydrOXYzine  (ATARAX ) 10 MG tablet; Take 1 tablet (10 mg total) by mouth 3 (three) times daily as needed.  Primary hypertension Well-controlled -     valsartan -hydrochlorothiazide  (DIOVAN -HCT) 160-25 MG tablet; Take 1 tablet by mouth daily.  Other orders 2/2Insomnia, unspecified type  -     traZODone  (DESYREL ) 50 MG tablet; Take 0.5-1 tablets (25-50 mg total) by mouth at bedtime as needed for sleep.       Patient have been counseled extensively about nutrition and exercise. Other issues discussed during this visit include: low cholesterol diet, weight control and daily exercise, foot care, annual eye examinations at Ophthalmology, importance of adherence with medications and regular follow-up. We also discussed long term complications of uncontrolled diabetes and hypertension.   No follow-ups on file.  The patient was given clear instructions to go to ER or return to medical center if symptoms don't improve, worsen or new problems develop. The patient verbalized understanding. The patient was told to call to get lab results if they haven't heard anything in the next week.   This note has been created with Education officer, environmental. Any transcriptional errors are unintentional.   Rosaline SHAUNNA Bohr, NP 06/07/2024, 3:50 PM

## 2024-06-23 ENCOUNTER — Ambulatory Visit: Admitting: Adult Health

## 2024-07-08 ENCOUNTER — Ambulatory Visit (INDEPENDENT_AMBULATORY_CARE_PROVIDER_SITE_OTHER): Admitting: Primary Care

## 2024-08-04 ENCOUNTER — Telehealth

## 2024-08-05 ENCOUNTER — Telehealth: Admitting: Emergency Medicine

## 2024-08-05 DIAGNOSIS — K0889 Other specified disorders of teeth and supporting structures: Secondary | ICD-10-CM | POA: Diagnosis not present

## 2024-08-05 MED ORDER — AMOXICILLIN 500 MG PO CAPS: 500.0000 mg | ORAL_CAPSULE | Freq: Three times a day (TID) | ORAL | 0 refills | Status: AC

## 2024-08-05 NOTE — Patient Instructions (Signed)
" °  Paula Giles, thank you for joining Lamar Schlossman, PA-C for today's virtual visit.  While this provider is not your primary care provider (PCP), if your PCP is located in our provider database this encounter information will be shared with them immediately following your visit.   A [facility] MyChart account gives you access to today's visit and all your visits, tests, and labs performed at [facility]  click here if you don't have a [facility] MyChart account or go to [email or web address]  Consent: (Patient) Paula Giles provided verbal consent for this virtual visit at the beginning of the encounter.  Current Medications:  Current Outpatient Medications:    amoxicillin  (AMOXIL ) 500 MG capsule, Take 1 capsule (500 mg total) by mouth 3 (three) times daily for 10 days., Disp: 30 capsule, Rfl: 0   albuterol  (VENTOLIN  HFA) 108 (90 Base) MCG/ACT inhaler, Inhale 1-2 puffs into the lungs every 6 (six) hours as needed., Disp: 8 g, Rfl: 0   doxycycline  (VIBRA -TABS) 100 MG tablet, Take 1 tablet (100 mg total) by mouth 2 (two) times daily., Disp: 14 tablet, Rfl: 0   fluticasone  (FLONASE ) 50 MCG/ACT nasal spray, Place 2 sprays into both nostrils daily., Disp: 16 g, Rfl: 0   hydrOXYzine  (ATARAX ) 10 MG tablet, Take 1 tablet (10 mg total) by mouth 3 (three) times daily as needed., Disp: 60 tablet, Rfl: 1   Semaglutide -Weight Management (WEGOVY ) 1 MG/0.5ML SOAJ, Inject 1 mg into the skin once a week. (Patient not taking: Reported on 06/07/2024), Disp: 0.5 mL, Rfl: 2   traZODone  (DESYREL ) 50 MG tablet, Take 0.5-1 tablets (25-50 mg total) by mouth at bedtime as needed for sleep., Disp: 30 tablet, Rfl: 1   valsartan -hydrochlorothiazide  (DIOVAN -HCT) 160-25 MG tablet, Take 1 tablet by mouth daily., Disp: 90 tablet, Rfl: 1   Medications ordered in this encounter:  Meds ordered this encounter  Medications   amoxicillin  (AMOXIL ) 500 MG capsule    Sig: Take 1 capsule (500 mg total) by mouth 3  (three) times daily for 10 days.    Dispense:  30 capsule    Refill:  0    Supervising Provider:   BLAISE ALEENE KIDD [[id number]]     *If you need refills on other medications prior to your next appointment, please contact your pharmacy*  Follow-Up: Call back or seek an in-person evaluation if the symptoms worsen or if the condition fails to improve as anticipated.  [facility] [phone]  Other Instructions    If you have been instructed to have an in-person evaluation today at a local Urgent Care facility, please use the link below. It will take you to a list of all of our available [facility], including address, phone number and hours of operation. Please do not delay care.  [facility]  If you or a family member do not have a primary care provider, use the link below to schedule a visit and establish care. When you choose a [facility] primary care physician or advanced practice provider, you gain a long-term partner in health. Find a Primary Care Provider  Learn more about [facility]'s in-office and virtual care options: [facility] - Get Care Now  "

## 2024-08-05 NOTE — Progress Notes (Signed)
 " Virtual Visit Consent   Paula Giles, you are scheduled for a virtual visit with a  provider today. Just as with appointments in the office, your consent must be obtained to participate. Your consent will be active for this visit and any virtual visit you may have with one of our providers in the next 365 days. If you have a MyChart account, a copy of this consent can be sent to you electronically.  As this is a virtual visit, video technology does not allow for your provider to perform a traditional examination. This may limit your provider's ability to fully assess your condition. If your provider identifies any concerns that need to be evaluated in person or the need to arrange testing (such as labs, EKG, etc.), we will make arrangements to do so. Although advances in technology are sophisticated, we cannot ensure that it will always work on either your end or our end. If the connection with a video visit is poor, the visit may have to be switched to a telephone visit. With either a video or telephone visit, we are not always able to ensure that we have a secure connection.  By engaging in this virtual visit, you consent to the provision of healthcare and authorize for your insurance to be billed (if applicable) for the services provided during this visit. Depending on your insurance coverage, you may receive a charge related to this service.  I need to obtain your verbal consent now. Are you willing to proceed with your visit today? Paula Giles has provided verbal consent on 08/05/2024 for a virtual visit (video or telephone). Lamar Schlossman, PA-C  Date: 08/05/2024 10:09 AM   Virtual Visit via Video Note   I, Lamar Schlossman, connected with  Paula Giles  ([id number], 1985-10-26) on 08/05/2024 at 10:00 AM EST by a video-enabled telemedicine application and verified that I am speaking with the correct person using two identifiers.  Location: Patient: Virtual Visit Location Patient:  Home Provider: Virtual Visit Location Provider: Home Office   I discussed the limitations of evaluation and management by telemedicine and the availability of in person appointments. The patient expressed understanding and agreed to proceed.    History of Present Illness: Paula Giles is a 39 y.o. who identifies as a female who was assigned female at birth, and is being seen today for dental pain.  States that she has been having pain for the past 3 days.  Sensitive to warm and cold.  Denies any swelling.  States that she does have a crack in the tooth.  States that she doesn't have a education officer, community.  HPI: HPI  Problems:  Patient Active Problem List   Diagnosis Date Noted   Elevated LFTs 11/19/2020   Hypertension 08/09/2020   Normocytic anemia 08/09/2020   Pre-diabetes 08/09/2020   Obesity 08/09/2020   Generalized anxiety disorder with panic attacks 08/09/2020   MDD (major depressive disorder) 08/09/2020   Abnormal uterine bleeding 08/09/2020    Allergies: Allergies[1] Medications: Current Medications[2]  Observations/Objective: Patient is well-developed, well-nourished in no acute distress.  Resting comfortably at home.  Head is normocephalic, atraumatic.  No labored breathing.  Speech is clear and coherent with logical content.  Patient is alert and oriented at baseline.    Assessment and Plan: 1. Pain, dental (Primary)   Meds ordered this encounter  Medications   amoxicillin  (AMOXIL ) 500 MG capsule    Sig: Take 1 capsule (500 mg total) by mouth 3 (three) times daily  for 10 days.    Dispense:  30 capsule    Refill:  0    Supervising Provider:   BLAISE ALEENE KIDD [[id number]]     Follow Up Instructions: I discussed the assessment and treatment plan with the patient. The patient was provided an opportunity to ask questions and all were answered. The patient agreed with the plan and demonstrated an understanding of the instructions.  A copy of instructions were sent to the  patient via MyChart unless otherwise noted below.     The patient was advised to call back or seek an in-person evaluation if the symptoms worsen or if the condition fails to improve as anticipated.    Lamar Schlossman, PA-C     [1]  Allergies Allergen Reactions   Latex Rash  [2]  Current Outpatient Medications:    albuterol  (VENTOLIN  HFA) 108 (90 Base) MCG/ACT inhaler, Inhale 1-2 puffs into the lungs every 6 (six) hours as needed., Disp: 8 g, Rfl: 0   doxycycline  (VIBRA -TABS) 100 MG tablet, Take 1 tablet (100 mg total) by mouth 2 (two) times daily., Disp: 14 tablet, Rfl: 0   fluticasone  (FLONASE ) 50 MCG/ACT nasal spray, Place 2 sprays into both nostrils daily., Disp: 16 g, Rfl: 0   hydrOXYzine  (ATARAX ) 10 MG tablet, Take 1 tablet (10 mg total) by mouth 3 (three) times daily as needed., Disp: 60 tablet, Rfl: 1   Semaglutide -Weight Management (WEGOVY ) 1 MG/0.5ML SOAJ, Inject 1 mg into the skin once a week. (Patient not taking: Reported on 06/07/2024), Disp: 0.5 mL, Rfl: 2   traZODone  (DESYREL ) 50 MG tablet, Take 0.5-1 tablets (25-50 mg total) by mouth at bedtime as needed for sleep., Disp: 30 tablet, Rfl: 1   valsartan -hydrochlorothiazide  (DIOVAN -HCT) 160-25 MG tablet, Take 1 tablet by mouth daily., Disp: 90 tablet, Rfl: 1  "

## 2024-08-16 ENCOUNTER — Ambulatory Visit: Admitting: Adult Health

## 2024-09-08 ENCOUNTER — Ambulatory Visit: Admitting: Adult Health

## 2024-10-21 ENCOUNTER — Ambulatory Visit: Admitting: Adult Health
